# Patient Record
Sex: Female | Born: 1945 | Race: White | Hispanic: No | Marital: Married | Smoking: Never smoker
Health system: Southern US, Community
[De-identification: ages and names within clinical notes are randomized; demographics above are authoritative.]

## PROBLEM LIST (undated history)

## (undated) DIAGNOSIS — G629 Polyneuropathy, unspecified: Secondary | ICD-10-CM

## (undated) DIAGNOSIS — M1990 Unspecified osteoarthritis, unspecified site: Secondary | ICD-10-CM

## (undated) DIAGNOSIS — K219 Gastro-esophageal reflux disease without esophagitis: Secondary | ICD-10-CM

## (undated) DIAGNOSIS — I10 Essential (primary) hypertension: Secondary | ICD-10-CM

## (undated) DIAGNOSIS — E1140 Type 2 diabetes mellitus with diabetic neuropathy, unspecified: Secondary | ICD-10-CM

## (undated) DIAGNOSIS — E119 Type 2 diabetes mellitus without complications: Secondary | ICD-10-CM

## (undated) DIAGNOSIS — H269 Unspecified cataract: Secondary | ICD-10-CM

## (undated) DIAGNOSIS — S82899A Other fracture of unspecified lower leg, initial encounter for closed fracture: Secondary | ICD-10-CM

## (undated) DIAGNOSIS — E669 Obesity, unspecified: Secondary | ICD-10-CM

## (undated) DIAGNOSIS — G4730 Sleep apnea, unspecified: Secondary | ICD-10-CM

## (undated) DIAGNOSIS — E785 Hyperlipidemia, unspecified: Secondary | ICD-10-CM

## (undated) HISTORY — PX: COLON SURGERY: SHX602

## (undated) HISTORY — DX: Unspecified osteoarthritis, unspecified site: M19.90

## (undated) HISTORY — DX: Other fracture of unspecified lower leg, initial encounter for closed fracture: S82.899A

## (undated) HISTORY — DX: Essential (primary) hypertension: I10

## (undated) HISTORY — DX: Unspecified cataract: H26.9

## (undated) HISTORY — DX: Obesity, unspecified: E66.9

## (undated) HISTORY — DX: Hyperlipidemia, unspecified: E78.5

## (undated) HISTORY — DX: Type 2 diabetes mellitus without complications: E11.9

## (undated) HISTORY — DX: Gastro-esophageal reflux disease without esophagitis: K21.9

## (undated) HISTORY — DX: Sleep apnea, unspecified: G47.30

## (undated) HISTORY — DX: Type 2 diabetes mellitus with diabetic neuropathy, unspecified: E11.40

## (undated) HISTORY — PX: COLONOSCOPY: SHX174

## (undated) HISTORY — DX: Polyneuropathy, unspecified: G62.9

---
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
## 2006-05-01 ENCOUNTER — Emergency Department (HOSPITAL_COMMUNITY): Admission: EM | Admit: 2006-05-01 | Discharge: 2006-05-01 | Payer: Self-pay | Admitting: Emergency Medicine

## 2006-05-26 ENCOUNTER — Ambulatory Visit (HOSPITAL_COMMUNITY): Admission: RE | Admit: 2006-05-26 | Discharge: 2006-05-26 | Payer: Self-pay | Admitting: Chiropractic Medicine

## 2007-02-16 ENCOUNTER — Ambulatory Visit (HOSPITAL_COMMUNITY): Admission: RE | Admit: 2007-02-16 | Discharge: 2007-02-16 | Payer: Self-pay | Admitting: *Deleted

## 2013-02-24 LAB — LIPID PANEL
Cholesterol: 204 mg/dL — AB (ref 0–200)
LDL Cholesterol: 115 mg/dL
Triglycerides: 243 mg/dL — AB (ref 40–160)

## 2013-02-24 LAB — HEPATIC FUNCTION PANEL: ALT: 15 U/L (ref 7–35)

## 2013-02-24 LAB — BASIC METABOLIC PANEL
Creatinine: 0.8 mg/dL (ref 0.5–1.1)
Glucose: 119 mg/dL

## 2013-06-29 ENCOUNTER — Encounter: Payer: Self-pay | Admitting: Physician Assistant

## 2013-06-29 ENCOUNTER — Ambulatory Visit: Payer: Self-pay | Admitting: Physician Assistant

## 2013-06-29 VITALS — BP 164/108 | HR 71 | Temp 97.7°F | Resp 16 | Ht 63.0 in | Wt 240.6 lb

## 2013-06-29 DIAGNOSIS — E1169 Type 2 diabetes mellitus with other specified complication: Secondary | ICD-10-CM | POA: Insufficient documentation

## 2013-06-29 DIAGNOSIS — E1142 Type 2 diabetes mellitus with diabetic polyneuropathy: Secondary | ICD-10-CM

## 2013-06-29 DIAGNOSIS — E119 Type 2 diabetes mellitus without complications: Secondary | ICD-10-CM | POA: Insufficient documentation

## 2013-06-29 DIAGNOSIS — E785 Hyperlipidemia, unspecified: Secondary | ICD-10-CM | POA: Insufficient documentation

## 2013-06-29 DIAGNOSIS — E1149 Type 2 diabetes mellitus with other diabetic neurological complication: Secondary | ICD-10-CM

## 2013-06-29 DIAGNOSIS — I10 Essential (primary) hypertension: Secondary | ICD-10-CM

## 2013-06-29 DIAGNOSIS — Z6837 Body mass index (BMI) 37.0-37.9, adult: Secondary | ICD-10-CM | POA: Insufficient documentation

## 2013-06-29 LAB — HIV ANTIBODY (ROUTINE TESTING W REFLEX): HIV-1 Abs-EIA: NONREACTIVE

## 2013-06-29 MED ORDER — GABAPENTIN 300 MG PO CAPS: 300.0000 mg | ORAL_CAPSULE | ORAL | Status: DC | PRN

## 2013-06-29 MED ORDER — METFORMIN HCL 500 MG PO TABS: 500.0000 mg | ORAL_TABLET | Freq: Two times a day (BID) | ORAL | Status: DC

## 2013-06-29 NOTE — Progress Notes (Signed)
Subjective:    Patient ID: Galya Dunnigan, female    DOB: [birth year], 67 y.o.   MRN: [id number]  HPI This 67 y.o. female presents to establish care and for evaluation of several concerns:  1. Foot pain, bilaterally x months.  Burning, pins & needles.  Often intolerable.  No swelling.  No lesions/skin changes. Uses her husband's neurontin about once a week, "When I can't take it any more."  Doesn't take it during the day due to sedation.  2. Wants blood sugar checked. Has a glucometer and has been checking for the past month.  Glucose runs 150's-200's.  Reports "never over 300."  Life insurance PE a year ago revealed an A1C of 6.0, but a repeat exam last month showed an increase to 7.5%.    She reports that she was previously followed here by Dr. Perrin Maltese.  Chart review indicates that she was last here in 2007.  She reports that she doesn't have health insurance (qualifies for Medicare this fall) and doesn't like doctors, so she doesn't come in much.  A friend has been prescribing losartanHCT due to elevated BP.   Past Medical History  Diagnosis Date  . Hypertension   . Diabetes mellitus without complication   . Obesity   . Ankle fracture     Past Surgical History  Procedure Laterality Date  . Appendectomy      childhood    Prior to Admission medications   Medication Sig Start Date End Date Taking? Authorizing Provider  Ascorbic Acid (VITAMIN C) 1000 MG tablet Take 1,000 mg by mouth daily.   Yes Historical Provider, MD  aspirin 81 MG tablet Take 81 mg by mouth daily.   Yes Historical Provider, MD  B Complex-C (B-COMPLEX WITH VITAMIN C) tablet Take 1 tablet by mouth daily.   Yes Historical Provider, MD  Calcium-Vitamin D-Vitamin K 500-100-40 MG-UNT-MCG CHEW Chew by mouth.   Yes Historical Provider, MD  Cinnamon 500 MG capsule Take 500 mg by mouth daily.   Yes Historical Provider, MD  gabapentin (NEURONTIN) 300 MG capsule Take 1 capsule (300 mg total) by mouth as needed. 06/29/13  Yes  Toua Stites S Dametri Ozburn, PA-C  GLUCOSAMINE-CHONDROITIN-VIT D3 PO Take by mouth.   Yes Historical Provider, MD  losartan-hydrochlorothiazide (HYZAAR) 100-12.5 MG per tablet Take 1 tablet by mouth daily.   Yes Historical Provider, MD  magnesium oxide (MAG-OX) 400 MG tablet Take 400 mg by mouth daily.   Yes Historical Provider, MD  Multiple Vitamins-Minerals (CENTRUM SILVER ADULT 50+ PO) Take by mouth.   Yes Historical Provider, MD  Potassium Gluconate 595 MG CAPS Take by mouth.   Yes Historical Provider, MD  metFORMIN (GLUCOPHAGE) 500 MG tablet Take 1 tablet (500 mg total) by mouth 2 (two) times daily with a meal. 06/29/13   Ariyana Faw S Theodora Lalanne, PA-C    No Known Allergies  History   Social History  . Marital Status: Married    Spouse Name: Chelsye Suhre, Sr.    Number of Children: 2  . Years of Education: 12+   Occupational History  . realtor   . car dealer    Social History Main Topics  . Smoking status: Never Smoker   . Smokeless tobacco: Not on file  . Alcohol Use: No  . Drug Use: No  . Sexual Activity: Not Currently   Other Topics Concern  . Not on file   Social History Narrative   Lives with her husband.  Her adult children live locally.    Family  History  Problem Relation Age of Onset  . Cancer Mother 69    Colon  . Stroke Father 10  . Hypertension Sister   . Diabetes Sister   . Kidney disease Sister     due to HTN and DM  . Hypertension Brother   . Stroke Brother   . Hypertension Daughter   . Diabetes Daughter   . Hypertension Sister   . Diabetes Sister   . Hypertension Sister   . Hyperlipidemia Sister   . Hypertension Brother      Review of Systems As above. Denies chest pain, shortness of breath, HA, dizziness, vision change, nausea, vomiting, diarrhea, constipation, melena, hematochezia, dysuria, increased urinary urgency or frequency, increased hunger or thirst, unintentional weight change, unexplained myalgias or arthralgias, rash.     Objective:    Physical Exam Blood pressure 164/108, pulse 71, temperature 97.7 F (36.5 C), temperature source Oral, resp. rate 16, height 5\' 3"  (1.6 m), weight 240 lb 9.6 oz (109.135 kg), SpO2 97.00%. Body mass index is 42.63 kg/(m^2). Well-developed, well nourished WF who is awake, alert and oriented, in NAD. HEENT: [place]/AT, PERRL, EOMI.  Sclera and conjunctiva are clear.  Funduscopic examination is normal. EAC are patent, TMs are normal in appearance. Nasal mucosa is pink and moist. OP is clear. Neck: supple, non-tender, no lymphadenopathy, thyromegaly. Heart: RRR, no murmur Lungs: normal effort, CTA Extremities: no cyanosis, clubbing or edema. Skin: warm and dry without rash. Psychologic: good mood and appropriate affect, normal speech and behavior.  See DM foot exam      Assessment & Plan:  HTN (hypertension) - above goal today, but 130's/80's at PE last month.  Patient will monitor at home 3 x/week and bring the readings in for my review in 4 weeks.  DM (diabetes mellitus), type 2 - New diagnosis. Plan: metFORMIN (GLUCOPHAGE) 500 MG tablet. RTC 4 weeks with TIW glucose readings. Healthy eating, regular exercise. No labs done today as they were done last month.  Diabetic peripheral neuropathy associated with type 2 diabetes mellitus - Plan: gabapentin (NEURONTIN) 300 MG capsule; get control of glucose.  Expect to see improvement in neuropathy.  ADDENDUM:  Insurance PE results reviewed. Exam and labs were actually done 02/24/2013. Glucose 119 A1C 7.5% Creatinine 0.84 GFR >90 AST 19 ALT 15 TC 204 HDL 40 LDL 115 TG 243 HIV non-reactive  Fernande Bras, PA-C Physician Assistant-Certified [facility]

## 2013-06-29 NOTE — Patient Instructions (Addendum)
Check your blood pressure and blood sugar three times each week and record the results. Alternate the time that you check your sugar (sometimes do it when you haven't had anything to eat or drink for 8-12 hours and sometimes 2-3 hours after the largest meal of the day).  Make healthy eating choices, exercise regularly (150 minutes of cardiovascular exercise, like walking, each week) and get plenty of water to drink.  For the first week on the metformin, take 1 tablet each day.  Then increase to one tablet two times each day.  Bring your recent lab results to your next visit for me to review.

## 2013-07-27 ENCOUNTER — Encounter: Payer: Self-pay | Admitting: Physician Assistant

## 2013-07-27 ENCOUNTER — Ambulatory Visit: Payer: Self-pay | Admitting: Physician Assistant

## 2013-07-27 VITALS — BP 140/88 | HR 72 | Temp 98.3°F | Resp 16 | Ht 62.0 in | Wt 238.0 lb

## 2013-07-27 DIAGNOSIS — E669 Obesity, unspecified: Secondary | ICD-10-CM

## 2013-07-27 DIAGNOSIS — E119 Type 2 diabetes mellitus without complications: Secondary | ICD-10-CM

## 2013-07-27 DIAGNOSIS — I10 Essential (primary) hypertension: Secondary | ICD-10-CM

## 2013-07-27 NOTE — Progress Notes (Signed)
I have examined this patient along with the student and agree.  

## 2013-07-27 NOTE — Progress Notes (Signed)
  Subjective:    [name], female    DOB: 1945-11-07, 67 y.o.   MRN: [id number]  HPI   Karina Davis is a 67 year old female with DM and HTN here today for a f/u. She was started on Metformin and Gabapentin at her last visit. The metformin has been giving her some GI upset with loose stools, but it seems to be resolving. No nausea, vomiting, or constipation. Her blood sugars have started trending down with a low reading of 101 and highest of 181 at the beginning of the month. Lately been running in 130s/140s. She denies dizziness and lightheadedness.   The gabapentin make her really sleepy so she only takes it at night. Nofalls but feels a little woozey in the AMs. Helping with the foot pain and numbness but still has tingling sensation.   Her blood pressure has still been running all over the place: highest reading 162/97 and lowest 124/76. She denies headaches, chest pain, or palpitations. No swelling in her ankles or vision changes.She is still eating out most meals of the day.    Review of Systems As above    Objective:   Physical Exam  Constitutional: She is oriented to person, place, and time. She appears well-developed and well-nourished.  obese  HENT:  Head: Normocephalic and atraumatic.  Cardiovascular: Normal rate, regular rhythm, normal heart sounds and intact distal pulses.   Pulmonary/Chest: Effort normal and breath sounds normal.  Musculoskeletal: Edema: trace edema in b/l lower legs.  Neurological: She is alert and oriented to person, place, and time.  Diabetic foot exam performed, see Diabetic foot note   BP 140/88  Pulse 72  Temp(Src) 98.3 F (36.8 C) (Oral)  Resp 16  Ht 5\' 2"  (1.575 m)  Wt 238 lb (107.956 kg)  BMI 43.52 kg/m2  SpO2 96%     Assessment & Plan:  Type II or unspecified type diabetes mellitus without mention of complication, not stated as uncontrolled - Plan: HM Diabetes Foot Exam. Repeat labs in 2 months. Will update vaccines  at that time as well.  HTN (hypertension)- Plan: Continue to monitor BP at home. Will reassess med at next visit  Obesity- Plan: Work on diet and exercise

## 2013-09-11 ENCOUNTER — Telehealth: Payer: Self-pay

## 2013-09-11 DIAGNOSIS — E119 Type 2 diabetes mellitus without complications: Secondary | ICD-10-CM

## 2013-09-11 MED ORDER — LOSARTAN POTASSIUM-HCTZ 100-12.5 MG PO TABS: 1.0000 | ORAL_TABLET | Freq: Every day | ORAL | Status: DC

## 2013-09-11 NOTE — Telephone Encounter (Signed)
Gabapentin and Metformin should have refills. She is advised. She is asking for Hyzaar, she indicates we have not sent this in for her before, pended. Please advise.

## 2013-09-11 NOTE — Telephone Encounter (Signed)
Pt requesting her bp meds,nerve pain meds,metformin(these have not been written by our providers before)   Best phone for pt is [phone]   Pharmacy [facility]

## 2013-09-11 NOTE — Telephone Encounter (Signed)
Hyzaar sent

## 2013-10-05 ENCOUNTER — Ambulatory Visit (INDEPENDENT_AMBULATORY_CARE_PROVIDER_SITE_OTHER): Payer: Medicare Other | Admitting: Physician Assistant

## 2013-10-05 ENCOUNTER — Encounter: Payer: Self-pay | Admitting: Physician Assistant

## 2013-10-05 VITALS — BP 142/75 | HR 84 | Temp 98.8°F | Resp 16 | Ht 61.0 in | Wt 230.8 lb

## 2013-10-05 DIAGNOSIS — I10 Essential (primary) hypertension: Secondary | ICD-10-CM

## 2013-10-05 DIAGNOSIS — E119 Type 2 diabetes mellitus without complications: Secondary | ICD-10-CM

## 2013-10-05 DIAGNOSIS — E1149 Type 2 diabetes mellitus with other diabetic neurological complication: Secondary | ICD-10-CM

## 2013-10-05 DIAGNOSIS — E1142 Type 2 diabetes mellitus with diabetic polyneuropathy: Secondary | ICD-10-CM

## 2013-10-05 DIAGNOSIS — Z1159 Encounter for screening for other viral diseases: Secondary | ICD-10-CM

## 2013-10-05 DIAGNOSIS — Z1211 Encounter for screening for malignant neoplasm of colon: Secondary | ICD-10-CM

## 2013-10-05 DIAGNOSIS — E669 Obesity, unspecified: Secondary | ICD-10-CM

## 2013-10-05 DIAGNOSIS — Z23 Encounter for immunization: Secondary | ICD-10-CM

## 2013-10-05 DIAGNOSIS — Z1239 Encounter for other screening for malignant neoplasm of breast: Secondary | ICD-10-CM

## 2013-10-05 DIAGNOSIS — E785 Hyperlipidemia, unspecified: Secondary | ICD-10-CM

## 2013-10-05 LAB — COMPREHENSIVE METABOLIC PANEL
AST: 14 U/L (ref 0–37)
BUN: 15 mg/dL (ref 6–23)
Chloride: 101 mEq/L (ref 96–112)
Creat: 0.82 mg/dL (ref 0.50–1.10)
Total Bilirubin: 0.4 mg/dL (ref 0.3–1.2)

## 2013-10-05 LAB — LIPID PANEL
Cholesterol: 176 mg/dL (ref 0–200)
HDL: 37 mg/dL — ABNORMAL LOW (ref >39)
Triglycerides: 248 mg/dL — ABNORMAL HIGH (ref <150)

## 2013-10-05 LAB — CBC WITH DIFFERENTIAL/PLATELET
Basophils Absolute: 0 10*3/uL (ref 0.0–0.1)
Basophils Relative: 0 % (ref 0–1)
Hemoglobin: 13.1 g/dL (ref 12.0–15.0)
MCHC: 34.3 g/dL (ref 30.0–36.0)
MCV: 84 fL (ref 78.0–100.0)
Monocytes Absolute: 0.8 10*3/uL (ref 0.1–1.0)
Neutro Abs: 8.2 10*3/uL — ABNORMAL HIGH (ref 1.7–7.7)
Neutrophils Relative %: 68 % (ref 43–77)
RBC: 4.55 MIL/uL (ref 3.87–5.11)
WBC: 12.1 10*3/uL — ABNORMAL HIGH (ref 4.0–10.5)

## 2013-10-05 LAB — TSH: TSH: 4.336 u[IU]/mL (ref 0.350–4.500)

## 2013-10-05 LAB — POCT GLYCOSYLATED HEMOGLOBIN (HGB A1C): Hemoglobin A1C: 6.1

## 2013-10-05 MED ORDER — ZOSTER VACCINE LIVE 19400 UNT/0.65ML ~~LOC~~ SOLR: 0.6500 mL | Freq: Once | SUBCUTANEOUS | Status: DC

## 2013-10-05 MED ORDER — GABAPENTIN 300 MG PO CAPS: 600.0000 mg | ORAL_CAPSULE | Freq: Every day | ORAL | Status: DC

## 2013-10-05 NOTE — Progress Notes (Signed)
Subjective:    [name], female    DOB: [birth year], 67 y.o.   MRN: [id number]  PCP: No primary provider on file.  Chief Complaint  Patient presents with  . Hypertension  . Diabetes    HPI  Presents for re-evaluation of HTN and DM type 2 with peripheral neuropathy. She's been working hard at being more healthy and is pleased with her weight loss. She continues to tolerate the medications without difficulty. Frequency of home glucose monitoring: 1-2 x/week, random readings are 120-155 Home BP readings were normal until 12/01 and 12/04 when they were 150's/85-90 Does not see a dentist Q6 months, eye specialist annually. Checks feet daily. Is not current with influenza vaccine. Is not current with pneumococcal vaccine.  Nerve pain in feet at night is improved, but not resolved.  Sometimes feel numb, "Like rubber feet." No daytime symptoms. Neurontin makes her too sleepy to take during the day.  Review of Systems Denies chest pain, shortness of breath, HA, dizziness, vision change, nausea, vomiting, diarrhea, constipation, melena, hematochezia, dysuria, increased urinary urgency or frequency, increased hunger or thirst, unintentional weight change, unexplained myalgias or arthralgias, rash.     Objective:   Physical Exam  Blood pressure 142/75, pulse 84, temperature 98.8 F (37.1 C), temperature source Oral, resp. rate 16, height 5\' 1"  (1.549 m), weight 230 lb 12.8 oz (104.69 kg), SpO2 97.00%. Body mass index is 43.63 kg/(m^2). Well-developed, well nourished WF who is awake, alert and oriented, in NAD. Weight is down 10 pounds in the past 3 months. HEENT: [place]/AT, PERRL, EOMI.  Sclera and conjunctiva are clear.  EAC are patent, TMs are normal in appearance. Nasal mucosa is pink and moist. OP is clear. Neck: supple, non-tender, no lymphadenopathy, thyromegaly. Heart: RRR, no murmur Lungs: normal effort, CTA Abdomen: normo-active bowel sounds, supple, non-tender, no  mass or organomegaly. Extremities: no cyanosis, clubbing or edema. Skin: warm and dry without rash. Psychologic: good mood and appropriate affect, normal speech and behavior. See DM foot exam.  Results for orders placed in visit on 10/05/13  GLUCOSE, POCT (MANUAL RESULT ENTRY)      Result Value Range   POC Glucose 88  70 - 99 mg/dl  POCT GLYCOSYLATED HEMOGLOBIN (HGB A1C)      Result Value Range   Hemoglobin A1C 6.1         Assessment & Plan:  1. DM (diabetes mellitus), type 2 Much improved, controlled - POCT glucose (manual entry) - POCT glycosylated hemoglobin (Hb A1C) - Microalbumin, urine - HM Diabetes Eye Exam - HM Diabetes Foot Exam  2. Diabetic peripheral neuropathy associated with type 2 diabetes mellitus Increase dose.  If causes morning somolence, we can reduce dose. - gabapentin (NEURONTIN) 300 MG capsule; Take 2 capsules (600 mg total) by mouth at bedtime.  Dispense: 180 capsule; Refill: 5  3. HTN (hypertension) Above goal today, but elect to continue current regimen for now. Increase regimen at next visit it remains >140/80 - CBC with Differential - Comprehensive metabolic panel - TSH  4. Hyperlipidemia LDL goal < 70 Await labs - Lipid panel  5. Obesity Congratulated on efforts and success thus far.  Encouraged continued work.  6. Need for hepatitis C screening test - Hepatitis C antibody  7. Need for influenza vaccination - Flu Vaccine QUAD 36+ mos IM  8. Need for pneumococcal vaccination - Pneumococcal polysaccharide vaccine 23-valent greater than or equal to 2yo subcutaneous/IM  9. Need for Tdap vaccination - Tdap vaccine greater  than or equal to 7yo IM  10. Need for shingles vaccine - zoster vaccine live, PF, (ZOSTAVAX) 47829 UNT/0.65ML injection; Inject 19,400 Units into the skin once.  Dispense: 0.65 mL; Refill: 0  11. Screening for colon cancer - Ambulatory referral to Gastroenterology  12. Screening for breast cancer - MM Digital  Screening; Future  Return in about 3 months (around 01/03/2014) for re-evaluation and Wellness Exam.  Fernande Bras, PA-C Physician Assistant-Certified [facility]

## 2013-10-05 NOTE — Patient Instructions (Addendum)
I will contact you with your lab results as soon as they are available.   If you have not heard from me in 2 weeks, please contact me.  The fastest way to get your results is to register for My Chart (see the instructions on the last page of this printout).  INCREASE the gabapentin (neurontin) dose to 600 mg each evening.  If you feel tired in the mornings, we can reduce the dose. Please schedule an eye exam and a dental exam. You should hear from the mammography center and the GI office to schedule the mammogram and the colonoscopy.  If you haven't heard from them in the next week, please contact this office.

## 2013-10-06 LAB — MICROALBUMIN, URINE: Microalb, Ur: 2.21 mg/dL — ABNORMAL HIGH (ref 0.00–1.89)

## 2013-10-06 LAB — HEPATITIS C ANTIBODY: HCV Ab: NEGATIVE

## 2013-11-09 ENCOUNTER — Ambulatory Visit: Payer: Self-pay

## 2013-11-15 ENCOUNTER — Encounter: Payer: Self-pay | Admitting: Internal Medicine

## 2013-12-27 ENCOUNTER — Encounter: Payer: Self-pay | Admitting: Gastroenterology

## 2014-01-04 ENCOUNTER — Ambulatory Visit (INDEPENDENT_AMBULATORY_CARE_PROVIDER_SITE_OTHER): Payer: Medicare Other | Admitting: Physician Assistant

## 2014-01-04 ENCOUNTER — Encounter: Payer: Self-pay | Admitting: Physician Assistant

## 2014-01-04 VITALS — BP 158/82 | HR 69 | Temp 98.6°F | Resp 18 | Ht 61.5 in | Wt 228.0 lb

## 2014-01-04 DIAGNOSIS — E119 Type 2 diabetes mellitus without complications: Secondary | ICD-10-CM

## 2014-01-04 DIAGNOSIS — Z0000 Encounter for general adult medical examination without abnormal findings: Secondary | ICD-10-CM

## 2014-01-04 DIAGNOSIS — E785 Hyperlipidemia, unspecified: Secondary | ICD-10-CM

## 2014-01-04 DIAGNOSIS — Z139 Encounter for screening, unspecified: Secondary | ICD-10-CM

## 2014-01-04 DIAGNOSIS — Z124 Encounter for screening for malignant neoplasm of cervix: Secondary | ICD-10-CM

## 2014-01-04 DIAGNOSIS — I10 Essential (primary) hypertension: Secondary | ICD-10-CM

## 2014-01-04 DIAGNOSIS — E669 Obesity, unspecified: Secondary | ICD-10-CM

## 2014-01-04 DIAGNOSIS — Z23 Encounter for immunization: Secondary | ICD-10-CM

## 2014-01-04 DIAGNOSIS — K029 Dental caries, unspecified: Secondary | ICD-10-CM | POA: Insufficient documentation

## 2014-01-04 LAB — CBC WITH DIFFERENTIAL/PLATELET
Basophils Absolute: 0 10*3/uL (ref 0.0–0.1)
Basophils Relative: 0 % (ref 0–1)
EOS PCT: 2 % (ref 0–5)
Eosinophils Absolute: 0.2 10*3/uL (ref 0.0–0.7)
HEMATOCRIT: 37.2 % (ref 36.0–46.0)
Hemoglobin: 12.6 g/dL (ref 12.0–15.0)
LYMPHS ABS: 2.9 10*3/uL (ref 0.7–4.0)
Lymphocytes Relative: 27 % (ref 12–46)
MCH: 28.1 pg (ref 26.0–34.0)
MCHC: 33.9 g/dL (ref 30.0–36.0)
MCV: 83 fL (ref 78.0–100.0)
Monocytes Absolute: 0.5 10*3/uL (ref 0.1–1.0)
Monocytes Relative: 5 % (ref 3–12)
Neutro Abs: 7.1 10*3/uL (ref 1.7–7.7)
Neutrophils Relative %: 66 % (ref 43–77)
PLATELETS: 339 10*3/uL (ref 150–400)
RBC: 4.48 MIL/uL (ref 3.87–5.11)
RDW: 14.5 % (ref 11.5–15.5)
WBC: 10.8 10*3/uL — AB (ref 4.0–10.5)

## 2014-01-04 LAB — COMPLETE METABOLIC PANEL WITH GFR
ALT: 15 U/L (ref 0–35)
AST: 18 U/L (ref 0–37)
Albumin: 4.1 g/dL (ref 3.5–5.2)
Alkaline Phosphatase: 101 U/L (ref 39–117)
BILIRUBIN TOTAL: 0.4 mg/dL (ref 0.2–1.2)
BUN: 13 mg/dL (ref 6–23)
CHLORIDE: 101 meq/L (ref 96–112)
CO2: 26 mEq/L (ref 19–32)
CREATININE: 0.73 mg/dL (ref 0.50–1.10)
Calcium: 9.6 mg/dL (ref 8.4–10.5)
GFR, Est African American: >89 mL/min
GFR, Est Non African American: 87 mL/min
GLUCOSE: 100 mg/dL — AB (ref 70–99)
Potassium: 4.3 mEq/L (ref 3.5–5.3)
Sodium: 138 mEq/L (ref 135–145)
Total Protein: 6.9 g/dL (ref 6.0–8.3)

## 2014-01-04 LAB — POCT WET PREP WITH KOH
KOH PREP POC: NEGATIVE
RBC Wet Prep HPF POC: NEGATIVE
TRICHOMONAS UA: NEGATIVE
Yeast Wet Prep HPF POC: NEGATIVE

## 2014-01-04 LAB — LIPID PANEL
Cholesterol: 189 mg/dL (ref 0–200)
HDL: 37 mg/dL — AB (ref >39)
LDL CALC: 112 mg/dL — AB (ref 0–99)
TRIGLYCERIDES: 200 mg/dL — AB (ref <150)
Total CHOL/HDL Ratio: 5.1 Ratio
VLDL: 40 mg/dL (ref 0–40)

## 2014-01-04 LAB — POCT GLYCOSYLATED HEMOGLOBIN (HGB A1C): HEMOGLOBIN A1C: 6.4

## 2014-01-04 LAB — IFOBT (OCCULT BLOOD): IMMUNOLOGICAL FECAL OCCULT BLOOD TEST: NEGATIVE

## 2014-01-04 MED ORDER — ZOSTER VACCINE LIVE 19400 UNT/0.65ML ~~LOC~~ SOLR: 0.6500 mL | Freq: Once | SUBCUTANEOUS | Status: DC

## 2014-01-04 NOTE — Progress Notes (Signed)
Subjective:    [name], female    DOB: 01-01-1946, 68 y.o.   MRN: [id number]  HPI  No menses x 5 years. Last pap 2004.   Review of Systems  Constitutional: Positive for diaphoresis.  Respiratory: Positive for wheezing.   Cardiovascular: Positive for palpitations.  Gastrointestinal: Positive for diarrhea.  Musculoskeletal: Positive for myalgias.  Neurological: Positive for numbness.       Objective:   Physical Exam  Vitals reviewed. Constitutional: She is oriented to person, place, and time. She appears well-developed and well-nourished. She is active and cooperative. No distress.  BP 158/82  Pulse 69  Temp(Src) 98.6 F (37 C)  Resp 18  Ht 5' 1.5" (1.562 m)  Wt 228 lb (103.42 kg)  BMI 42.39 kg/m2  SpO2 94%   HENT:  Head: Normocephalic and atraumatic.  Right Ear: Hearing, tympanic membrane, external ear and ear canal normal. No foreign bodies.  Left Ear: Hearing, tympanic membrane, external ear and ear canal normal. No foreign bodies.  Nose: Nose normal.  Mouth/Throat: Uvula is midline, oropharynx is clear and moist and mucous membranes are normal. No oral lesions. Abnormal dentition (several broken/rotten teeth). Dental caries present. No dental abscesses or uvula swelling. No oropharyngeal exudate.  Eyes: Conjunctivae, EOM and lids are normal. Pupils are equal, round, and reactive to light. Right eye exhibits no discharge. Left eye exhibits no discharge. No scleral icterus.  Fundoscopic exam:      The right eye shows no arteriolar narrowing, no AV nicking, no exudate, no hemorrhage and no papilledema.       The left eye shows no arteriolar narrowing, no AV nicking, no exudate, no hemorrhage and no papilledema.  Neck: Trachea normal, normal range of motion and full passive range of motion without pain. Neck supple. No spinous process tenderness and no muscular tenderness present. No mass and no thyromegaly present.  Cardiovascular: Normal rate, regular  rhythm, normal heart sounds, intact distal pulses and normal pulses.   Pulmonary/Chest: Effort normal and breath sounds normal. She exhibits no tenderness and no retraction. Right breast exhibits no inverted nipple, no mass, no nipple discharge, no skin change and no tenderness. Left breast exhibits no inverted nipple, no mass, no nipple discharge, no skin change and no tenderness. Breasts are symmetrical.  Abdominal: Soft. Normal appearance and bowel sounds are normal. She exhibits no distension and no mass. There is no hepatosplenomegaly. There is no tenderness. There is no rigidity, no rebound, no guarding, no CVA tenderness, no tenderness at McBurney's point and negative Murphy's sign. No hernia. Hernia confirmed negative in the right inguinal area and confirmed negative in the left inguinal area.  Genitourinary: Rectum normal, vagina normal and uterus normal. Rectal exam shows no external hemorrhoid and no fissure. No breast swelling, tenderness, discharge or bleeding. Pelvic exam was performed with patient supine. No labial fusion. There is no rash, tenderness, lesion or injury on the right labia. There is no rash, tenderness, lesion or injury on the left labia. Cervix exhibits no motion tenderness, no discharge and no friability. Right adnexum displays no mass, no tenderness and no fullness. Left adnexum displays no mass, no tenderness and no fullness. No erythema, tenderness or bleeding around the vagina. No foreign body around the vagina. No signs of injury around the vagina. No vaginal discharge found.  Musculoskeletal: She exhibits no edema and no tenderness.       Cervical back: Normal.       Thoracic back: Normal.  Lumbar back: Normal.  Lymphadenopathy:       Head (right side): No tonsillar, no preauricular, no posterior auricular and no occipital adenopathy present.       Head (left side): No tonsillar, no preauricular, no posterior auricular and no occipital adenopathy present.    She  has no cervical adenopathy.    She has no axillary adenopathy.       Right: No inguinal and no supraclavicular adenopathy present.       Left: No inguinal and no supraclavicular adenopathy present.  Neurological: She is alert and oriented to person, place, and time. She has normal strength and normal reflexes. No cranial nerve deficit. She exhibits normal muscle tone. Coordination and gait normal.  Skin: Skin is warm, dry and intact. No rash noted. She is not diaphoretic. No cyanosis or erythema. Nails show no clubbing.  Psychiatric: She has a normal mood and affect. Her speech is normal and behavior is normal. Judgment and thought content normal.      Results for orders placed in visit on 01/04/14  POCT GLYCOSYLATED HEMOGLOBIN (HGB A1C)      Result Value Ref Range   Hemoglobin A1C 6.4    POCT WET PREP WITH KOH      Result Value Ref Range   Trichomonas, UA Negative     Clue Cells Wet Prep HPF POC 3-5     Epithelial Wet Prep HPF POC 0-1     Yeast Wet Prep HPF POC neg     Bacteria Wet Prep HPF POC 2+     RBC Wet Prep HPF POC neg     WBC Wet Prep HPF POC 0-1     KOH Prep POC Negative    IFOBT (OCCULT BLOOD)      Result Value Ref Range   IFOBT Negative         Assessment & Plan:  1. Medicare annual wellness visit, initial 2. Annual physical exam Age appropriate anticipatory guidance provided. - IFOBT POC (occult bld, rslt in office); needs colonoscopy. Mammogram scheduled.  3. DM (diabetes mellitus), type 2 Good control. - POCT glycosylated hemoglobin (Hb A1C) - HM Diabetes Foot Exam, Eye Exam  4. Hyperlipidemia LDL goal < 70 Await labs. - Lipid panel  5. HTN (hypertension) Above goal today. - CBC with Differential - COMPLETE METABOLIC PANEL WITH GFR - EKG 12-Lead  6. Obesity Healthy eating, regular exercise.  7. Need for shingles vaccine - zoster vaccine live, PF, (ZOSTAVAX) 27062 UNT/0.65ML injection; Inject 19,400 Units into the skin once.  Dispense: 0.65 mL;  Refill: 0  8. Need for hepatitis B vaccination Dose #1 given today.  Return in 1 month for #2, and in 6 months for #3. - Hepatitis B vaccine adult IM; Standing - Hepatitis B vaccine adult IM  9. Screening for cervical cancer If both pap and HPV are negative, repeat both in 5 years. - Pap IG and HPV (high risk) DNA detection - POCT Wet Prep with KOH  10. Carious teeth Follow-up with DDS as planned.   Fara Chute, PA-C Physician Assistant-Certified [facility]

## 2014-01-04 NOTE — Patient Instructions (Addendum)
I will contact you with your lab results as soon as they are available.   If you have not heard from me in 2 weeks, please contact me.  The fastest way to get your results is to register for My Chart (see the instructions on the last page of this printout).  Call to reschedule the mammogram ([facility] [phone])  Keeping You Healthy  Get These Tests  Blood Pressure- Have your blood pressure checked by your healthcare provider at least once a year.  Normal blood pressure is 120/80.  Weight- Have your body mass index (BMI) calculated to screen for obesity.  BMI is a measure of body fat based on height and weight.  You can calculate your own BMI at https://www.west-esparza.com/  Cholesterol- Have your cholesterol checked every year.  Diabetes- Have your blood sugar checked every year if you have high blood pressure, high cholesterol, a family history of diabetes or if you are overweight.  Pap Smear- Have a pap smear every 1 to 3 years if you have been sexually active.  If you are older than 65 and recent pap smears have been normal you may not need additional pap smears.  In addition, if you have had a hysterectomy  For benign disease additional pap smears are not necessary.  Mammogram-Yearly mammograms are essential for early detection of breast cancer  Screening for Colon Cancer- Colonoscopy starting at age 92. Screening may begin sooner depending on your family history and other health conditions.  Follow up colonoscopy as directed by your Gastroenterologist.  Screening for Osteoporosis- Screening begins at age 57 with bone density scanning, sooner if you are at higher risk for developing Osteoporosis.  Get these medicines  Calcium with Vitamin D- Your body requires 1200-1500 mg of Calcium a day and [phone] IU of Vitamin D a day.  You can only absorb 500 mg of Calcium at a time therefore Calcium must be taken in 2 or 3 separate doses throughout the day.  Hormones- Hormone  therapy has been associated with increased risk for certain cancers and heart disease.  Talk to your healthcare provider about if you need relief from menopausal symptoms.  Aspirin- Ask your healthcare provider about taking Aspirin to prevent Heart Disease and Stroke.  Get these Immuniztions  Flu shot- Every fall  Pneumonia shot- Once after the age of 24; if you are younger ask your healthcare provider if you need a pneumonia shot.  Tetanus- Every ten years.  Zostavax- Once after the age of 4 to prevent shingles.  Take these steps  Don't smoke- Your healthcare provider can help you quit. For tips on how to quit, ask your healthcare provider or go to www.smokefree.gov or call 1-800 QUIT-NOW.  Be physically active- Exercise 5 days a week for a minimum of 30 minutes.  If you are not already physically active, start slow and gradually work up to 30 minutes of moderate physical activity.  Try walking, dancing, bike riding, swimming, etc.  Eat a healthy diet- Eat a variety of healthy foods such as fruits, vegetables, whole grains, low fat milk, low fat cheeses, yogurt, lean meats, chicken, fish, eggs, dried beans, tofu, etc.  For more information go to www.thenutritionsource.org  Dental visit- Brush and floss teeth twice daily; visit your dentist twice a year.  Eye exam- Visit your Optometrist or Ophthalmologist yearly.  Drink alcohol in moderation- Limit alcohol intake to one drink or less a day.  Never drink and drive.  Depression- Your emotional health is  as important as your physical health.  If you're feeling down or losing interest in things you normally enjoy, please talk to your healthcare provider.  Seat Belts- can save your life; always wear one  Smoke/Carbon Monoxide detectors- These detectors need to be installed on the appropriate level of your home.  Replace batteries at least once a year.  Violence- If anyone is threatening or hurting you, please tell your healthcare  provider. Living Will/ Health care power of attorney- Discuss with your healthcare provider and family.Cancer Prevention Tips Eat more foods that come from plants, such as fruits, vegetables, beans, nuts, and whole grains. Eat less food that comes from animals, such as meat, cheese, and eggs. Eat 5 servings of different fruits and vegetables every day. Eat fruits and vegetables of all colors. Eat green foods like broccoli, lettuce, or greens. Eat yellow-orange foods like carrots, cantaloupe, bananas, or sweet potatoes. Eat red foods like strawberries, tomatoes, or red beans. Eat blue or purple foods like blueberries, eggplant, or plums. Eat white foods like garlic, potatoes, or onions. Avoid fried food. Eat brown rice, whole wheat bread, whole grain pasta, and cereals. Eat less white rice, white bread, regular pasta, desserts, sweetened cereals, soft drinks, and sugars. Eat fish, chicken, [place], or beans. Eat only a small piece of any meat. Cook meat by baking, broiling, or boiling. Stay at a healthy weight. Lose weight if you are too heavy. Eat less food that is high in empty calories, like [place] fries, fried chicken, pizza, doughnuts, and other sweets. Move or exercise as much as you can. Try walking, gardening, dancing, or bicycling to help you burn calories. Take the stairs instead of the elevator. Walk to where you are going instead of driving. Drink less alcohol. Men should limit alcohol to 2 drinks per day. Women should limit alcohol to 1 drink per day. Do not smoke or use any kind of tobacco. Check with your doctor before you make any big changes in your diet or lifestyle. Document Released: 01/08/2009 Document Revised: 04/12/2012 Document Reviewed: 01/08/2009 [facility] Patient Information 2014 [place], [place]. Heart Disease Prevention Heart disease can lead to heart attacks and strokes. This is a leading cause of death. Heart disease can be inherited and can be caused from the  lifestyle you lead. You can do a lot to keep your heart and blood vessels healthy.  WHAT SHOULD I DO EACH DAY TO KEEP MY HEART HEALTHY? Do not smoke. Follow a healthy eating plan as recommended by your caregiver or dietitian. Be active for a total of 30 minutes most days. Ask your caregiver what activities are best for you. Limit the amount of alcohol you drink. Involve family and friends to help you with a healthy lifestyle. HOW DOES HEART DISEASE CAUSE HIGH BLOOD PRESSURE? Narrowed blood vessels leave a smaller opening for blood to flow through. It is like turning on a garden hose and holding your thumb over the opening. The smaller opening makes the water shoot out with more pressure. In the same way, narrowed blood vessels can lead to high blood pressure. Other factors, such as kidney problems and being overweight, also can lead to high blood pressure. If you have high blood pressure you may need to take blood pressure medicine every day. Some types of blood pressure medicine can also help keep your kidneys healthy. Many people with diabetes also have high blood pressure. If you have heart, eye, or kidney problems from diabetes, high blood pressure can make them worse. HOW  DO MY BLOOD VESSELS GET CLOGGED? Cholesterol is a substance that is made by the body and used for many important functions. It is also found in food that comes from animals. When your cholesterol is high, it can stick to the insides of your blood vessels, making them narrowed and even clogged. This problem is called atherosclerosis. Narrowed and clogged blood vessels make it harder for blood to get to important body organs. This can cause problems such as: Chest pain (angina). Angina can cause temporary pain in your chest, arms, shoulders, or back. You may feel the pain more when your heart beats faster, such as when you exercise. The pain may go away when you rest. You also may feel very weak and sweaty. A heart attack. A heart  attack happens when a blood vessel in or near the heart becomes blocked. Not enough blood is getting to the heart. During a heart attack, you may have chest pain in your chest, arms, shoulders, or back along with nausea, indigestion, extreme weakness, and sweating. WHAT CAN I DO TO PREVENT HEART DISEASE?  Keep your blood pressure under control as recommended by your caregiver. Keep your cholesterol under control. Have it checked at least once a year. Target cholesterol levels for most people are: Total blood cholesterol level: Below 200. LDL (bad) cholesterol: Below 100. HDL (good) cholesterol: Above 40 in men and above 50 in women. Triglycerides (another type of fat in the blood): Below 150. Make physical activity a part of your daily routine. Check with your caregiver to learn what activities are best for you. Make sure that the foods you eat are "heart-healthy." Include foods high in fiber, such as oat bran, oatmeal, whole-grain breads and cereals. Cut back on fried foods and foods high in saturated fat. This includes foods such as meats, butter, whole dairy products, shortening, and coconut or palm oil. Avoid salty foods such as canned food, luncheon meat, salty snacks, and fast food. Eat more fruits and vegetables. Drink less alcohol. Lose weight as recommended by your caregiver. If you smoke, quit. Your caregiver can help you with quitting options. Ask your caregiver whether you should take a daily aspirin. Studies have shown that taking aspirin can help reduce your risk of heart disease and stroke. Take your prescribed medicines as directed. WHAT ARE THE WARNING SIGNS OF A HEART ATTACK? You may have one or more of the following warning signs: Chest pain or discomfort. Pain or discomfort in your arms, back, jaw, or neck. Indigestion or stomach pain. Shortness of breath. Sweating. Nausea or vomiting. Lightheadedness. No warning signs at all or they may come and go. FOR MORE  INFORMATION  To find out more about heart disease and stroke prevention, visit the American Heart Association website at www.americanheart.org Document Released: 05/26/2004 Document Revised: 04/12/2012 Document Reviewed: 12/09/2007 [facility] Patient Information 2014 [place], [place]. Calorie Counting Diet A calorie counting diet requires you to eat the number of calories that are right for you in a day. Calories are the measurement of how much energy you get from the food you eat. Eating the right amount of calories is important for staying at a healthy weight. If you eat too many calories, your body will store them as fat and you may gain weight. If you eat too few calories, you may lose weight. Counting the number of calories you eat during a day will help you know if you are eating the right amount. A Registered Dietitian can determine how many calories you  need in a day. The amount of calories needed varies from person to person. If your goal is to lose weight, you will need to eat fewer calories. Losing weight can benefit you if you are overweight or have health problems such as heart disease, high blood pressure, or diabetes. If your goal is to gain weight, you will need to eat more calories. Gaining weight may be necessary if you have a certain health problem that causes your body to need more energy. TIPS Whether you are increasing or decreasing the number of calories you eat during a day, it may be hard to get used to changes in what you eat and drink. The following are tips to help you keep track of the number of calories you eat. Measure foods at home with measuring cups. This helps you know the amount of food and number of calories you are eating. Restaurants often serve food in amounts that are larger than 1 serving. While eating out, estimate how many servings of a food you are given. For example, a serving of cooked rice is  cup or about the size of half of a fist. Knowing serving sizes will  help you be aware of how much food you are eating at restaurants. Ask for smaller portion sizes or child-size portions at restaurants. Plan to eat half of a meal at a restaurant. Take the rest home or share the other half with a friend. Read the Nutrition Facts panel on food labels for calorie content and serving size. You can find out how many servings are in a package, the size of a serving, and the number of calories each serving has. For example, a package might contain 3 cookies. The Nutrition Facts panel on that package says that 1 serving is 1 cookie. Below that, it will say there are 3 servings in the container. The calories section of the Nutrition Facts label says there are 90 calories. This means there are 90 calories in 1 cookie (1 serving). If you eat 1 cookie you have eaten 90 calories. If you eat all 3 cookies, you have eaten 270 calories (3 servings x 90 calories = 270 calories). The list below tells you how big or small some common portion sizes are. 1 oz.........4 stacked dice. 3 oz........Marland KitchenDeck of cards. 1 tsp.......Marland KitchenTip of little finger. 1 tbs......Marland KitchenMarland KitchenThumb. 2 tbs.......Marland KitchenGolf ball.  cup......Marland KitchenHalf of a fist. 1 cup.......Marland KitchenA fist. KEEP A FOOD LOG Write down every food item you eat, the amount you eat, and the number of calories in each food you eat during the day. At the end of the day, you can add up the total number of calories you have eaten. It may help to keep a list like the one below. Find out the calorie information by reading the Nutrition Facts panel on food labels. Breakfast Bran cereal (1 cup, 110 calories). Fat-free milk ( cup, 45 calories). Snack Apple (1 medium, 80 calories). Lunch Spinach (1 cup, 20 calories). Tomato ( medium, 20 calories). Chicken breast strips (3 oz, 165 calories). Shredded cheddar cheese ( cup, 110 calories). Light [place] dressing (2 tbs, 60 calories). Whole-wheat bread (1 slice, 80 calories). Tub margarine (1 tsp, 35  calories). Vegetable soup (1 cup, 160 calories). Dinner Pork chop (3 oz, 190 calories). Brown rice (1 cup, 215 calories). Steamed broccoli ( cup, 20 calories). Strawberries (1  cup, 65 calories). Whipped cream (1 tbs, 50 calories). Daily Calorie Total: 5643 Document Released: 10/12/2005 Document Revised: 01/04/2012 Document Reviewed: 04/08/2007 [employer] Patient Information  2014 [place], [place].

## 2014-01-08 LAB — PAP IG AND HPV HIGH-RISK: HPV DNA HIGH RISK: NOT DETECTED

## 2014-01-10 ENCOUNTER — Telehealth: Payer: Self-pay

## 2014-01-10 ENCOUNTER — Encounter: Payer: Self-pay | Admitting: Internal Medicine

## 2014-01-10 NOTE — Telephone Encounter (Signed)
Your pap test and HPV test were both negative, so we'll repeat them both in 5 years. Your labs are otherwise good, except for the cholesterol. I expect that your continued efforts for healthy eating and weight loss will improve those numbers, though we may still need to add something. We'll see how they improve over the next 3 months  Reviewed labs with patient, copy mailed to patient.

## 2014-02-02 ENCOUNTER — Ambulatory Visit (AMBULATORY_SURGERY_CENTER): Payer: Self-pay | Admitting: *Deleted

## 2014-02-02 VITALS — Ht 62.0 in | Wt 229.8 lb

## 2014-02-02 DIAGNOSIS — Z1211 Encounter for screening for malignant neoplasm of colon: Secondary | ICD-10-CM

## 2014-02-02 MED ORDER — NA SULFATE-K SULFATE-MG SULF 17.5-3.13-1.6 GM/177ML PO SOLN: 1.0000 | Freq: Once | ORAL | Status: DC

## 2014-02-02 NOTE — Progress Notes (Signed)
No allergies to eggs or soy. No problems with anesthesia.  Pt given Emmi instructions for colonoscopy  

## 2014-02-14 ENCOUNTER — Ambulatory Visit (AMBULATORY_SURGERY_CENTER): Payer: Medicare Other | Admitting: Gastroenterology

## 2014-02-14 ENCOUNTER — Encounter: Payer: Self-pay | Admitting: Gastroenterology

## 2014-02-14 VITALS — BP 205/76 | HR 70 | Temp 97.2°F | Resp 14 | Ht 62.0 in | Wt 229.0 lb

## 2014-02-14 DIAGNOSIS — K5730 Diverticulosis of large intestine without perforation or abscess without bleeding: Secondary | ICD-10-CM

## 2014-02-14 DIAGNOSIS — D126 Benign neoplasm of colon, unspecified: Secondary | ICD-10-CM

## 2014-02-14 DIAGNOSIS — Z1211 Encounter for screening for malignant neoplasm of colon: Secondary | ICD-10-CM

## 2014-02-14 DIAGNOSIS — K648 Other hemorrhoids: Secondary | ICD-10-CM

## 2014-02-14 MED ORDER — SODIUM CHLORIDE 0.9 % IV SOLN: 500.0000 mL | INTRAVENOUS | Status: DC

## 2014-02-14 NOTE — Progress Notes (Signed)
Report to pacu rn, vss, bbs=clear 

## 2014-02-14 NOTE — Op Note (Addendum)
[place]  [address] [place], [address]   COLONOSCOPY PROCEDURE REPORT  PATIENT: Karina Davis, Karina Davis  MR#: [id number] BIRTHDATE: 1945-12-27 , 50  yrs. old GENDER: Female ENDOSCOPIST: Inda Castle, MD REFERRED MO:QHUTML [place], [place] PROCEDURE DATE:  02/14/2014 PROCEDURE:   Colonoscopy with snare polypectomy First Screening Colonoscopy - Avg.  risk and is 50 yrs.  old or older Yes.  Prior Negative Screening - Now for repeat screening. N/A  History of Adenoma - Now for follow-up colonoscopy & has been > or = to 3 yrs.  N/A  Polyps Removed Today? Yes. ASA CLASS:   Class II INDICATIONS: Patient's immediate family history of colon cancer. MEDICATIONS: MAC sedation, administered by CRNA and propofol (Diprivan) 250mg  IV  DESCRIPTION OF PROCEDURE:   After the risks benefits and alternatives of the procedure were thoroughly explained, informed consent was obtained.  A digital rectal exam revealed no abnormalities of the rectum and A digital rectal exam revealed several skin tags.   The LB [id number]  endoscope was introduced through the anus and advanced to the cecum, which was identified by both the appendix and ileocecal valve. No adverse events experienced.   The quality of the prep was excellent using Suprep  The instrument was then slowly withdrawn as the colon was fully examined.      COLON FINDINGS: Two sessile polyps ranging between 3-41mm in size were found in the proximal transverse colon.  A polypectomy was performed with a cold snare.  The resection was complete and the polyp tissue was completely retrieved.   Mild diverticulosis was noted in the sigmoid colon.   Internal hemorrhoids were found. Retroflexed views revealed no abnormalities. The time to cecum=3 minutes 02 seconds.  Withdrawal time=10 minutes 54 seconds.  The scope was withdrawn and the procedure completed. COMPLICATIONS: There were no complications.  ENDOSCOPIC IMPRESSION: 1.    Two sessile polyps ranging between 3-75mm in size were found in the proximal transverse colon; polypectomy was performed with a cold snare 2.   Mild diverticulosis was noted in the sigmoid colon 3.   Internal hemorrhoids  RECOMMENDATIONS: 1.  Given your significant family history of colon cancer, you should have a repeat colonoscopy in 5 years  eSigned:  Inda Castle, MD 02/14/2014 11:00 AM Revised: 02/14/2014 11:00 AM  cc:   PATIENT NAME:  Karina Davis MR#: [id number]

## 2014-02-14 NOTE — Patient Instructions (Signed)
Discharge instructions given with verbal understanding. Handouts on polyps,diverticulosis and hemorrhoids. Resume previous medications. YOU HAD AN ENDOSCOPIC PROCEDURE TODAY AT THE [facility]: Refer to the procedure report that was given to you for any specific questions about what was found during the examination.  If the procedure report does not answer your questions, please call your gastroenterologist to clarify.  If you requested that your care partner not be given the details of your procedure findings, then the procedure report has been included in a sealed envelope for you to review at your convenience later.  YOU SHOULD EXPECT: Some feelings of bloating in the abdomen. Passage of more gas than usual.  Walking can help get rid of the air that was put into your GI tract during the procedure and reduce the bloating. If you had a lower endoscopy (such as a colonoscopy or flexible sigmoidoscopy) you may notice spotting of blood in your stool or on the toilet paper. If you underwent a bowel prep for your procedure, then you may not have a normal bowel movement for a few days.  DIET: Your first meal following the procedure should be a light meal and then it is ok to progress to your normal diet.  A half-sandwich or bowl of soup is an example of a good first meal.  Heavy or fried foods are harder to digest and may make you feel nauseous or bloated.  Likewise meals heavy in dairy and vegetables can cause extra gas to form and this can also increase the bloating.  Drink plenty of fluids but you should avoid alcoholic beverages for 24 hours.  ACTIVITY: Your care partner should take you home directly after the procedure.  You should plan to take it easy, moving slowly for the rest of the day.  You can resume normal activity the day after the procedure however you should NOT DRIVE or use heavy machinery for 24 hours (because of the sedation medicines used during the test).    SYMPTOMS TO REPORT  IMMEDIATELY: A gastroenterologist can be reached at any hour.  During normal business hours, 8:30 AM to 5:00 PM Monday through Friday, call [phone].  After hours and on weekends, please call the GI answering service at [phone] who will take a message and have the physician on call contact you.   Following lower endoscopy (colonoscopy or flexible sigmoidoscopy):  Excessive amounts of blood in the stool  Significant tenderness or worsening of abdominal pains  Swelling of the abdomen that is new, acute  Fever of 100F or higher  FOLLOW UP: If any biopsies were taken you will be contacted by phone or by letter within the next 1-3 weeks.  Call your gastroenterologist if you have not heard about the biopsies in 3 weeks.  Our staff will call the home number listed on your records the next business day following your procedure to check on you and address any questions or concerns that you may have at that time regarding the information given to you following your procedure. This is a courtesy call and so if there is no answer at the home number and we have not heard from you through the emergency physician on call, we will assume that you have returned to your regular daily activities without incident.  SIGNATURES/CONFIDENTIALITY: You and/or your care partner have signed paperwork which will be entered into your electronic medical record.  These signatures attest to the fact that that the information above on your After Visit Summary   has been reviewed and is understood.  Full responsibility of the confidentiality of this discharge information lies with you and/or your care-partner. 

## 2014-02-14 NOTE — Progress Notes (Signed)
Called to room to assist during endoscopic procedure.  Patient ID and intended procedure confirmed with present staff. Received instructions for my participation in the procedure from the performing physician.  

## 2014-02-15 ENCOUNTER — Telehealth: Payer: Self-pay | Admitting: *Deleted

## 2014-02-15 NOTE — Telephone Encounter (Signed)
  Follow up Call-  Call back number 02/14/2014  Post procedure Call Back phone  # [phone]  Permission to leave phone message Yes     Patient questions:  Do you have a fever, pain , or abdominal swelling? no Pain Score  0 *  Have you tolerated food without any problems? yes  Have you been able to return to your normal activities? yes  Do you have any questions about your discharge instructions: Diet   no Medications  no Follow up visit  no  Do you have questions or concerns about your Care? no  Actions: * If pain score is 4 or above: No action needed, pain <4.

## 2014-02-20 ENCOUNTER — Encounter: Payer: Self-pay | Admitting: Gastroenterology

## 2014-02-28 ENCOUNTER — Telehealth: Payer: Self-pay | Admitting: Gastroenterology

## 2014-03-01 NOTE — Telephone Encounter (Signed)
Pt had questions regarding her pathology report from her polyps. Discussed path results and letter with pt. Pts questions were answered.

## 2014-03-01 NOTE — Telephone Encounter (Signed)
Attempted to return call but mailbox was full.

## 2014-03-12 ENCOUNTER — Encounter (INDEPENDENT_AMBULATORY_CARE_PROVIDER_SITE_OTHER): Payer: Medicare Other | Admitting: Ophthalmology

## 2014-03-12 DIAGNOSIS — E11319 Type 2 diabetes mellitus with unspecified diabetic retinopathy without macular edema: Secondary | ICD-10-CM

## 2014-03-12 DIAGNOSIS — I10 Essential (primary) hypertension: Secondary | ICD-10-CM

## 2014-03-12 DIAGNOSIS — H35039 Hypertensive retinopathy, unspecified eye: Secondary | ICD-10-CM

## 2014-03-12 DIAGNOSIS — H2510 Age-related nuclear cataract, unspecified eye: Secondary | ICD-10-CM

## 2014-03-12 DIAGNOSIS — E1139 Type 2 diabetes mellitus with other diabetic ophthalmic complication: Secondary | ICD-10-CM

## 2014-03-12 DIAGNOSIS — E1165 Type 2 diabetes mellitus with hyperglycemia: Secondary | ICD-10-CM

## 2014-03-12 DIAGNOSIS — H43819 Vitreous degeneration, unspecified eye: Secondary | ICD-10-CM

## 2014-03-20 ENCOUNTER — Other Ambulatory Visit: Payer: Self-pay | Admitting: Physician Assistant

## 2014-03-22 ENCOUNTER — Telehealth: Payer: Self-pay

## 2014-03-22 MED ORDER — LOSARTAN POTASSIUM-HCTZ 100-12.5 MG PO TABS: ORAL_TABLET | ORAL | Status: DC

## 2014-03-22 NOTE — Telephone Encounter (Signed)
losartan-hydrochlorothiazide (HYZAAR) 100-12.5 MG per tablet , needs refill on medicine , [facility] couple of requests and they have been denied, she has an appt. With chelle at then end of the month, please call pt. For refills

## 2014-03-22 NOTE — Telephone Encounter (Signed)
Prescription sent on 5/27 was not received by [facility]. Resent rx.

## 2014-04-05 ENCOUNTER — Ambulatory Visit: Payer: Medicare Other | Admitting: Physician Assistant

## 2014-04-24 ENCOUNTER — Ambulatory Visit (INDEPENDENT_AMBULATORY_CARE_PROVIDER_SITE_OTHER): Payer: Medicare Other

## 2014-04-24 ENCOUNTER — Encounter: Payer: Self-pay | Admitting: Physician Assistant

## 2014-04-24 ENCOUNTER — Ambulatory Visit (INDEPENDENT_AMBULATORY_CARE_PROVIDER_SITE_OTHER): Payer: Medicare Other | Admitting: Emergency Medicine

## 2014-04-24 VITALS — BP 120/80 | HR 69 | Temp 97.6°F | Resp 16 | Ht 64.0 in | Wt 225.0 lb

## 2014-04-24 DIAGNOSIS — M545 Low back pain, unspecified: Secondary | ICD-10-CM

## 2014-04-24 DIAGNOSIS — E119 Type 2 diabetes mellitus without complications: Secondary | ICD-10-CM

## 2014-04-24 DIAGNOSIS — G629 Polyneuropathy, unspecified: Secondary | ICD-10-CM

## 2014-04-24 DIAGNOSIS — G609 Hereditary and idiopathic neuropathy, unspecified: Secondary | ICD-10-CM

## 2014-04-24 DIAGNOSIS — I10 Essential (primary) hypertension: Secondary | ICD-10-CM

## 2014-04-24 DIAGNOSIS — E785 Hyperlipidemia, unspecified: Secondary | ICD-10-CM

## 2014-04-24 DIAGNOSIS — E669 Obesity, unspecified: Secondary | ICD-10-CM

## 2014-04-24 LAB — CBC WITH DIFFERENTIAL/PLATELET
Basophils Absolute: 0 10*3/uL (ref 0.0–0.1)
Basophils Relative: 0 % (ref 0–1)
Eosinophils Absolute: 0.2 10*3/uL (ref 0.0–0.7)
Eosinophils Relative: 2 % (ref 0–5)
HCT: 36.5 % (ref 36.0–46.0)
Hemoglobin: 12.8 g/dL (ref 12.0–15.0)
Lymphocytes Relative: 25 % (ref 12–46)
Lymphs Abs: 2.7 10*3/uL (ref 0.7–4.0)
MCH: 29.2 pg (ref 26.0–34.0)
MCHC: 35.1 g/dL (ref 30.0–36.0)
MCV: 83.3 fL (ref 78.0–100.0)
Monocytes Absolute: 0.6 10*3/uL (ref 0.1–1.0)
Monocytes Relative: 6 % (ref 3–12)
Neutro Abs: 7.1 10*3/uL (ref 1.7–7.7)
Neutrophils Relative %: 67 % (ref 43–77)
Platelets: 357 10*3/uL (ref 150–400)
RBC: 4.38 MIL/uL (ref 3.87–5.11)
RDW: 14.6 % (ref 11.5–15.5)
WBC: 10.6 10*3/uL — ABNORMAL HIGH (ref 4.0–10.5)

## 2014-04-24 LAB — LIPID PANEL
Cholesterol: 212 mg/dL — ABNORMAL HIGH (ref 0–200)
HDL: 35 mg/dL — ABNORMAL LOW
LDL Cholesterol: 134 mg/dL — ABNORMAL HIGH (ref 0–99)
Total CHOL/HDL Ratio: 6.1 ratio
Triglycerides: 214 mg/dL — ABNORMAL HIGH
VLDL: 43 mg/dL — ABNORMAL HIGH (ref 0–40)

## 2014-04-24 LAB — COMPLETE METABOLIC PANEL WITH GFR
ALK PHOS: 103 U/L (ref 39–117)
ALT: 14 U/L (ref 0–35)
AST: 13 U/L (ref 0–37)
Albumin: 4.2 g/dL (ref 3.5–5.2)
BUN: 14 mg/dL (ref 6–23)
CALCIUM: 9.4 mg/dL (ref 8.4–10.5)
CHLORIDE: 100 meq/L (ref 96–112)
CO2: 27 mEq/L (ref 19–32)
Creat: 0.82 mg/dL (ref 0.50–1.10)
GFR, Est African American: 87 mL/min
GFR, Est Non African American: 75 mL/min
Glucose, Bld: 137 mg/dL — ABNORMAL HIGH (ref 70–99)
POTASSIUM: 4.4 meq/L (ref 3.5–5.3)
SODIUM: 136 meq/L (ref 135–145)
TOTAL PROTEIN: 6.6 g/dL (ref 6.0–8.3)
Total Bilirubin: 0.5 mg/dL (ref 0.2–1.2)

## 2014-04-24 LAB — MICROALBUMIN, URINE: Microalb, Ur: 0.5 mg/dL (ref 0.00–1.89)

## 2014-04-24 LAB — POCT GLYCOSYLATED HEMOGLOBIN (HGB A1C): Hemoglobin A1C: 6.5

## 2014-04-24 LAB — TSH: TSH: 6.099 u[IU]/mL — ABNORMAL HIGH (ref 0.350–4.500)

## 2014-04-24 LAB — GLUCOSE, POCT (MANUAL RESULT ENTRY): POC GLUCOSE: 134 mg/dL — AB (ref 70–99)

## 2014-04-24 NOTE — Progress Notes (Signed)
Subjective:    [name], female    DOB: Dec 22, 1945, 68 y.o.   MRN: [id number]   PCP: JEFFERY,CHELLE, PA-C  Chief Complaint  Patient presents with  . Hyperlipidemia  . Hypertension    Medications, allergies, past medical history, surgical history, family history, social history and problem list reviewed and updated.  Patient Active Problem List   Diagnosis Date Noted  . Carious teeth 01/04/2014  . HTN (hypertension) 06/29/2013  . DM (diabetes mellitus), type 2 06/29/2013  . Hyperlipidemia with target LDL less than 70 06/29/2013  . Obesity     Prior to Admission medications   Medication Sig Start Date End Date Taking? Authorizing Provider  Ascorbic Acid (VITAMIN C) 1000 MG tablet Take 1,000 mg by mouth daily.   Yes Historical Provider, MD  aspirin 81 MG tablet Take 81 mg by mouth daily.   Yes Historical Provider, MD  B Complex-C (B-COMPLEX WITH VITAMIN C) tablet Take 1 tablet by mouth daily.   Yes Historical Provider, MD  Calcium-Vitamin D-Vitamin K [id number] MG-UNT-MCG CHEW Chew by mouth.   Yes Historical Provider, MD  Cinnamon 500 MG capsule Take 500 mg by mouth daily.   Yes Historical Provider, MD  gabapentin (NEURONTIN) 300 MG capsule Take 2 capsules (600 mg total) by mouth at bedtime. 10/05/13  Yes Chelle S Jeffery, PA-C  GLUCOSAMINE-CHONDROITIN-VIT D3 PO Take by mouth.   Yes Historical Provider, MD  losartan-hydrochlorothiazide (HYZAAR) 100-12.5 MG per tablet TAKE 1 TABLET BY MOUTH DAILY. 03/22/14  Yes Mancel Bale, PA-C  magnesium oxide (MAG-OX) 400 MG tablet Take 400 mg by mouth daily.   Yes Historical Provider, MD  metFORMIN (GLUCOPHAGE) 500 MG tablet Take 1 tablet (500 mg total) by mouth 2 (two) times daily with a meal. 06/29/13  Yes Chelle S Jeffery, PA-C  Multiple Vitamins-Minerals (CENTRUM SILVER ADULT 50+ PO) Take by mouth.   Yes Historical Provider, MD  Omega-3 Fatty Acids (FISH OIL PO) Take by mouth daily.   Yes Historical Provider, MD  Potassium  Gluconate 595 MG CAPS Take by mouth.   Yes Historical Provider, MD  zoster vaccine live, PF, (ZOSTAVAX) 65784 UNT/0.65ML injection Inject 19,400 Units into the skin once. 01/04/14   Chelle Janalee Dane, PA-C    HPI  Frequency of home glucose monitoring: 1-2 x/week; 120's fasting and 2-3 hours post-prandially Doesn't see a dentist Q6 months, sees eye specialist annually (02/2014, Dr. Zigmund Daniel). Checks feet daily. Is current with influenza vaccine. Is current with pneumococcal-23 vaccine. Needs Prevnar-13.  Is planning to get Zostavax. Missed her mammogram appointment, needs to reschedule.  Continues to feel pins and needles in her feet at night, even with increased gabapentin dose. Aleve PM helps her sleep through it.  Sometimes haves pain in the low back and into the LEFT hip, especially after long sitting or LEFT side lying.   Review of Systems     Objective:   Physical Exam    Results for orders placed in visit on 04/24/14  GLUCOSE, POCT (MANUAL RESULT ENTRY)      Result Value Ref Range   POC Glucose 134 (*) 70 - 99 mg/dl  POCT GLYCOSYLATED HEMOGLOBIN (HGB A1C)      Result Value Ref Range   Hemoglobin A1C 6.5     LS Spine: [facility] reading (PRIMARY) by  Dr. Everlene Farrier. Degenerative changes in the lower thoracic spine, with questionable vertebral body compression at T12. Mild spondylolisthesis L4-5.      Assessment & Plan:  1. Type  II or unspecified type diabetes mellitus without mention of complication, not stated as uncontrolled Continue current treatment and efforts for healthy lifestyle changes. - HM Diabetes Foot Exam - POCT glucose (manual entry) - POCT glycosylated hemoglobin (Hb A1C) - COMPLETE METABOLIC PANEL WITH GFR - Microalbumin, urine  2. Unspecified essential hypertension Controlled. Continue current medication. - CBC with Differential - TSH  3. Hyperlipidemia with target LDL less than 70 Await lab results. - Lipid panel  4. Obesity Healthy lifestyle  changes.  8. Peripheral neuropathy Increase neurontin to 900 mg QHS.  If no better, consider Lyrica.  9. Midline low back pain without sciatica Mild degenerative changes. OTC NSAIDS/acetaminophen prn for now. - DG Lumbar Spine Complete; Future  Return in about 3 months (around 07/25/2014).   Fara Chute, PA-C Physician Assistant-Certified [facility]

## 2014-04-24 NOTE — Patient Instructions (Addendum)
I will contact you with your lab results as soon as they are available.   If you have not heard from me in 2 weeks, please contact me.  The fastest way to get your results is to register for My Chart (see the instructions on the last page of this printout).  Call to re-schedule the mammogram. [phone] Please schedule an appointment with your dentist. INCREASE the gabapentin to 900 mg at bedtime (take 3 of the 300 mg).  If it's not helpful, we can try Lyrica.

## 2014-06-20 ENCOUNTER — Other Ambulatory Visit: Payer: Self-pay | Admitting: Physician Assistant

## 2014-09-25 ENCOUNTER — Telehealth: Payer: Self-pay

## 2014-09-25 MED ORDER — LOSARTAN POTASSIUM-HCTZ 100-12.5 MG PO TABS: 1.0000 | ORAL_TABLET | Freq: Every day | ORAL | Status: DC

## 2014-09-25 NOTE — Telephone Encounter (Signed)
Patient is requesting a refill on losartan-hydrochlorothiazide (HYZAAR) 100-12.5 MG per tablet From Chelle. She receives a 3 month supply and uses the pharmacy [facility] on [address]. She says she is completely out of her medication.    Patient was having difficulty accessing mychart right now. She is currently working on it to [facility] in the future for refills too.

## 2014-09-25 NOTE — Telephone Encounter (Signed)
Refill sent to [facility]. Pt advised need a follow up appt with Chelle- pt transferred to scheduling.

## 2014-11-08 ENCOUNTER — Ambulatory Visit: Payer: Medicare Other | Admitting: Physician Assistant

## 2014-12-27 ENCOUNTER — Other Ambulatory Visit: Payer: Self-pay | Admitting: Physician Assistant

## 2014-12-27 ENCOUNTER — Telehealth: Payer: Self-pay

## 2014-12-27 ENCOUNTER — Other Ambulatory Visit: Payer: Self-pay | Admitting: Emergency Medicine

## 2014-12-27 NOTE — Telephone Encounter (Signed)
Pt is having her pharmacy send [place] over refill requests for both metFORMIN (GLUCOPHAGE) 500 MG tablet [[id number]] and a BP medication. Pt states that she does need an OV, and intends on making one once her insurance straightens outs. Pt would like to know if she can have these refills approved in the mean time. Please advise

## 2014-12-27 NOTE — Telephone Encounter (Signed)
Called pt to let her know her Rx's were sent in. Left detailed message.

## 2015-01-28 ENCOUNTER — Other Ambulatory Visit: Payer: Self-pay

## 2015-01-28 NOTE — Telephone Encounter (Signed)
Patient is requesting a refill on her Losartan 100 mg (Blood Pressure Medication). Patient states she is aware she is due to come in but she currently does not have insurance. She is requesting it to be called in to [facility]. s call back number is [phone]

## 2015-01-29 MED ORDER — LOSARTAN POTASSIUM-HCTZ 100-12.5 MG PO TABS: 1.0000 | ORAL_TABLET | Freq: Every day | ORAL | Status: DC

## 2015-01-29 NOTE — Telephone Encounter (Signed)
Dr Everlene Farrier, do you want to give pt any more RFs of losartan - last seen 03/2014?

## 2015-01-30 NOTE — Telephone Encounter (Signed)
Notified pt RFs were sent. Advised her that she will need to plan to come in before these run out and that we do give a discount to pt's w/out ins. Pt agreed.

## 2015-03-20 ENCOUNTER — Ambulatory Visit (INDEPENDENT_AMBULATORY_CARE_PROVIDER_SITE_OTHER): Payer: Medicare Other | Admitting: Ophthalmology

## 2015-03-20 ENCOUNTER — Telehealth: Payer: Self-pay

## 2015-03-20 NOTE — Telephone Encounter (Signed)
Pt is needing metformin and blood pressure meds refilled   [phone]

## 2015-03-21 MED ORDER — METFORMIN HCL 500 MG PO TABS: 500.0000 mg | ORAL_TABLET | Freq: Two times a day (BID) | ORAL | Status: DC

## 2015-03-21 MED ORDER — LOSARTAN POTASSIUM-HCTZ 100-12.5 MG PO TABS: 1.0000 | ORAL_TABLET | Freq: Every day | ORAL | Status: DC

## 2015-03-21 NOTE — Telephone Encounter (Signed)
Spoke with pt, she states she will have insurance in July. I sent in 2 month but this is the last refill. Advised she has to come in. Pt understood.

## 2015-04-18 ENCOUNTER — Encounter: Payer: Self-pay | Admitting: *Deleted

## 2015-04-22 ENCOUNTER — Other Ambulatory Visit: Payer: Self-pay

## 2015-04-26 ENCOUNTER — Other Ambulatory Visit: Payer: Self-pay

## 2015-04-26 NOTE — Telephone Encounter (Signed)
Pt requesting refill of metformin and losartan

## 2015-04-29 MED ORDER — LOSARTAN POTASSIUM-HCTZ 100-12.5 MG PO TABS: 1.0000 | ORAL_TABLET | Freq: Every day | ORAL | Status: DC

## 2015-04-29 MED ORDER — METFORMIN HCL 500 MG PO TABS: 500.0000 mg | ORAL_TABLET | Freq: Two times a day (BID) | ORAL | Status: DC

## 2015-04-29 NOTE — Telephone Encounter (Signed)
Meds ordered this encounter  Medications  . losartan-hydrochlorothiazide (HYZAAR) 100-12.5 MG per tablet    Sig: Take 1 tablet by mouth daily.    Dispense:  30 tablet    Refill:  1  . metFORMIN (GLUCOPHAGE) 500 MG tablet    Sig: Take 1 tablet (500 mg total) by mouth 2 (two) times daily with a meal.    Dispense:  60 tablet    Refill:  1

## 2015-04-29 NOTE — Telephone Encounter (Signed)
Chelle, pt should actually have a RF at pharm that should last until end of July, but she has an appt sch w/you for Aug 23. Can we go ahead and send in 2 mos of each to cover her through then? She hasn't been in since 04/24/14.

## 2015-04-30 NOTE — Telephone Encounter (Signed)
Tried to call pt to notify but both ph # have full mailboxes and couldn't LM. I'm sure pt will be checking with pharm and will find that we have RFd her meds.

## 2015-04-30 NOTE — Telephone Encounter (Signed)
Pt Cb and I notified her RFs were sent and advised her to be sure to keep appt in Aug. Pt agreed, she was waiting for new ins and it will begin next month.

## 2015-05-06 ENCOUNTER — Ambulatory Visit (INDEPENDENT_AMBULATORY_CARE_PROVIDER_SITE_OTHER): Payer: Self-pay | Admitting: Ophthalmology

## 2015-05-28 ENCOUNTER — Telehealth: Payer: Self-pay

## 2015-05-28 NOTE — Telephone Encounter (Signed)
Pt states she have an appt with Chelle on the 23rd, but will be out of her medicine HYZAAR 100-12.5 MG AND METFORMIN 500 MG. Please call [place]

## 2015-05-29 MED ORDER — METFORMIN HCL 500 MG PO TABS: 500.0000 mg | ORAL_TABLET | Freq: Two times a day (BID) | ORAL | Status: DC

## 2015-05-29 MED ORDER — LOSARTAN POTASSIUM-HCTZ 100-12.5 MG PO TABS: 1.0000 | ORAL_TABLET | Freq: Every day | ORAL | Status: DC

## 2015-05-29 NOTE — Telephone Encounter (Signed)
Rx refilled for 30 days. Pt notified.

## 2015-06-18 ENCOUNTER — Encounter: Payer: Self-pay | Admitting: Physician Assistant

## 2015-06-18 ENCOUNTER — Ambulatory Visit (INDEPENDENT_AMBULATORY_CARE_PROVIDER_SITE_OTHER): Payer: Medicare Other | Admitting: Physician Assistant

## 2015-06-18 VITALS — BP 121/82 | HR 73 | Temp 98.7°F | Resp 18 | Ht 64.0 in | Wt 222.6 lb

## 2015-06-18 DIAGNOSIS — E669 Obesity, unspecified: Secondary | ICD-10-CM

## 2015-06-18 DIAGNOSIS — Z780 Asymptomatic menopausal state: Secondary | ICD-10-CM

## 2015-06-18 DIAGNOSIS — E119 Type 2 diabetes mellitus without complications: Secondary | ICD-10-CM | POA: Diagnosis not present

## 2015-06-18 DIAGNOSIS — Z1231 Encounter for screening mammogram for malignant neoplasm of breast: Secondary | ICD-10-CM | POA: Diagnosis not present

## 2015-06-18 DIAGNOSIS — Z23 Encounter for immunization: Secondary | ICD-10-CM

## 2015-06-18 DIAGNOSIS — E785 Hyperlipidemia, unspecified: Secondary | ICD-10-CM

## 2015-06-18 DIAGNOSIS — I10 Essential (primary) hypertension: Secondary | ICD-10-CM | POA: Diagnosis not present

## 2015-06-18 LAB — COMPLETE METABOLIC PANEL WITH GFR
ALT: 11 U/L (ref 6–29)
AST: 11 U/L (ref 10–35)
Albumin: 3.7 g/dL (ref 3.6–5.1)
Alkaline Phosphatase: 84 U/L (ref 33–130)
BILIRUBIN TOTAL: 0.3 mg/dL (ref 0.2–1.2)
BUN: 18 mg/dL (ref 7–25)
CHLORIDE: 103 mmol/L (ref 98–110)
CO2: 25 mmol/L (ref 20–31)
CREATININE: 0.79 mg/dL (ref 0.50–0.99)
Calcium: 9.8 mg/dL (ref 8.6–10.4)
GFR, Est Non African American: 78 mL/min (ref >=60)
GLUCOSE: 116 mg/dL — AB (ref 65–99)
Potassium: 4.3 mmol/L (ref 3.5–5.3)
SODIUM: 139 mmol/L (ref 135–146)
TOTAL PROTEIN: 6.2 g/dL (ref 6.1–8.1)

## 2015-06-18 LAB — CBC WITH DIFFERENTIAL/PLATELET
BASOS ABS: 0 10*3/uL (ref 0.0–0.1)
Basophils Relative: 0 % (ref 0–1)
EOS ABS: 0.2 10*3/uL (ref 0.0–0.7)
EOS PCT: 2 % (ref 0–5)
HEMATOCRIT: 36.4 % (ref 36.0–46.0)
Hemoglobin: 12 g/dL (ref 12.0–15.0)
LYMPHS ABS: 2.9 10*3/uL (ref 0.7–4.0)
LYMPHS PCT: 28 % (ref 12–46)
MCH: 27.8 pg (ref 26.0–34.0)
MCHC: 33 g/dL (ref 30.0–36.0)
MCV: 84.3 fL (ref 78.0–100.0)
MONO ABS: 0.6 10*3/uL (ref 0.1–1.0)
MONOS PCT: 6 % (ref 3–12)
MPV: 9.8 fL (ref 8.6–12.4)
Neutro Abs: 6.6 10*3/uL (ref 1.7–7.7)
Neutrophils Relative %: 64 % (ref 43–77)
PLATELETS: 336 10*3/uL (ref 150–400)
RBC: 4.32 MIL/uL (ref 3.87–5.11)
RDW: 14.3 % (ref 11.5–15.5)
WBC: 10.3 10*3/uL (ref 4.0–10.5)

## 2015-06-18 LAB — LIPID PANEL
CHOL/HDL RATIO: 5.2 ratio — AB (ref <=5.0)
CHOLESTEROL: 166 mg/dL (ref 125–200)
HDL: 32 mg/dL — ABNORMAL LOW (ref >=46)
LDL CALC: 96 mg/dL (ref <130)
Triglycerides: 188 mg/dL — ABNORMAL HIGH (ref <150)
VLDL: 38 mg/dL — AB (ref <30)

## 2015-06-18 LAB — TSH: TSH: 5.677 u[IU]/mL — ABNORMAL HIGH (ref 0.350–4.500)

## 2015-06-18 LAB — MICROALBUMIN, URINE: Microalb, Ur: 1.5 mg/dL (ref <2.0)

## 2015-06-18 LAB — GLUCOSE, POCT (MANUAL RESULT ENTRY): POC Glucose: 126 mg/dl — AB (ref 70–99)

## 2015-06-18 LAB — POCT GLYCOSYLATED HEMOGLOBIN (HGB A1C): HEMOGLOBIN A1C: 6.3

## 2015-06-18 MED ORDER — METFORMIN HCL 500 MG PO TABS: 500.0000 mg | ORAL_TABLET | Freq: Two times a day (BID) | ORAL | Status: DC

## 2015-06-18 MED ORDER — LOSARTAN POTASSIUM-HCTZ 100-12.5 MG PO TABS: 1.0000 | ORAL_TABLET | Freq: Every day | ORAL | Status: DC

## 2015-06-18 NOTE — Progress Notes (Signed)
[name], female    DOB: [birth year], 69 y.o.   MRN: [id number]  PCP: [place]  Subjective:   Chief Complaint  Patient presents with  . Hypertension  . Hyperlipidemia  . Diabetes    HPI Presents for evaluation of diabetes, HTN and hyperlipidemia..  I haven't seen her in a while, since her insurance changed and she had to negotiate things to get back in to see me. Overall she feel's well. She's making changes in her eating and exercise routines. Tolerating medications without difficulty. Upcoming [place] cruise scheduled.    Review of Systems  Constitutional: Negative for activity change, appetite change, fatigue and unexpected weight change.  HENT: Negative for congestion, dental problem, ear pain, hearing loss, mouth sores, postnasal drip, rhinorrhea, sneezing, sore throat, tinnitus and trouble swallowing.   Eyes: Negative for photophobia, pain, redness and visual disturbance.  Respiratory: Negative for cough, chest tightness and shortness of breath.   Cardiovascular: Negative for chest pain, palpitations and leg swelling.  Gastrointestinal: Negative for nausea, vomiting, abdominal pain, diarrhea, constipation and blood in stool.  Genitourinary: Negative for dysuria, urgency, frequency and hematuria.  Musculoskeletal: Negative for myalgias, arthralgias, gait problem and neck stiffness.  Skin: Negative for rash.  Neurological: Negative for dizziness, speech difficulty, weakness, light-headedness, numbness and headaches.  Hematological: Negative for adenopathy.  Psychiatric/Behavioral: Negative for confusion and sleep disturbance. The patient is not nervous/anxious.        Patient Active Problem List   Diagnosis Date Noted  . Carious teeth 01/04/2014  . HTN (hypertension) 06/29/2013  . DM (diabetes mellitus), type 2 06/29/2013  . Hyperlipidemia with target LDL less than 70 06/29/2013  . Obesity      Prior to Admission medications     Medication Sig Start Date End Date Taking? Authorizing Provider  Ascorbic Acid (VITAMIN C) 1000 MG tablet Take 1,000 mg by mouth daily.   Yes Historical Provider, MD  aspirin 81 MG tablet Take 81 mg by mouth daily.   Yes Historical Provider, MD  B Complex-C (B-COMPLEX WITH VITAMIN C) tablet Take 1 tablet by mouth daily.   Yes Historical Provider, MD  CALCIUM CITRATE-VITAMIN D3 PO Take by mouth.   Yes Historical Provider, MD  Calcium-Vitamin D-Vitamin K [phone] MG-UNT-MCG CHEW Chew by mouth.   Yes Historical Provider, MD  Cinnamon 500 MG capsule Take 500 mg by mouth daily.   Yes Historical Provider, MD  COD LIVER OIL PO Take by mouth.   Yes Historical Provider, MD  GLUCOSAMINE-CHONDROITIN-VIT D3 PO Take by mouth.   Yes Historical Provider, MD  losartan-hydrochlorothiazide (HYZAAR) 100-12.5 MG per tablet Take 1 tablet by mouth daily. 06/18/15  Yes Chandon Lazcano, PA-C  magnesium oxide (MAG-OX) 400 MG tablet Take 400 mg by mouth daily.   Yes Historical Provider, MD  metFORMIN (GLUCOPHAGE) 500 MG tablet Take 1 tablet (500 mg total) by mouth 2 (two) times daily with a meal. 06/18/15  Yes Silverio Hagan, PA-C  Multiple Vitamins-Minerals (CENTRUM SILVER ADULT 50+ PO) Take by mouth.   Yes Historical Provider, MD  Multiple Vitamins-Minerals (MEGA BASIC PO) Take 500 Int'l Units by mouth daily.   Yes Historical Provider, MD  Omega-3 Fatty Acids (FISH OIL PO) Take by mouth daily.   Yes Historical Provider, MD  OVER THE COUNTER MEDICATION 400 mg daily.   Yes Historical Provider, MD  Potassium Gluconate 595 MG CAPS Take by mouth.   Yes Historical Provider, MD  Red Yeast Rice Extract (RED YEAST RICE PO) Take 1,200  mg by mouth daily.   Yes Historical Provider, MD  Vitamin K, Phytonadione, 100 MCG TABS Take by mouth.   Yes Historical Provider, MD  gabapentin (NEURONTIN) 300 MG capsule Take 2 capsules (600 mg total) by mouth at bedtime. Patient not taking: Reported on 06/18/2015 10/05/13   Harrison Mons, PA-C   zoster vaccine live, PF, (ZOSTAVAX) 19509 UNT/0.65ML injection Inject 19,400 Units into the skin once. Patient not taking: Reported on 06/18/2015 01/04/14   Harrison Mons, PA-C     No Known Allergies     Objective:  Physical Exam  Constitutional: She is oriented to person, place, and time. She appears well-developed and well-nourished. No distress.  BP 121/82 mmHg  Pulse 73  Temp(Src) 98.7 F (37.1 C) (Oral)  Resp 18  Ht 5\' 4"  (1.626 m)  Wt 222 lb 9.6 oz (100.971 kg)  BMI 38.19 kg/m2   Eyes: Conjunctivae are normal. No scleral icterus.  Neck: Neck supple. No thyromegaly present.  Cardiovascular: Normal rate, regular rhythm, normal heart sounds and intact distal pulses.   Pulmonary/Chest: Effort normal and breath sounds normal.  Lymphadenopathy:    She has no cervical adenopathy.  Neurological: She is alert and oriented to person, place, and time.  Skin: Skin is warm and dry.  Psychiatric: She has a normal mood and affect. Her behavior is normal.       Results for orders placed or performed in visit on 06/18/15  POCT glucose (manual entry)  Result Value Ref Range   POC Glucose 126 (A) 70 - 99 mg/dl  POCT glycosylated hemoglobin (Hb A1C)  Result Value Ref Range   Hemoglobin A1C 6.3        Assessment & Plan:   1. Type 2 diabetes mellitus without complication Controlled. Continue current treatment and lifestyle changes. - HM Diabetes Foot Exam - POCT glucose (manual entry) - POCT glycosylated hemoglobin (Hb A1C) - COMPLETE METABOLIC PANEL WITH GFR - Microalbumin, urine - metFORMIN (GLUCOPHAGE) 500 MG tablet; Take 1 tablet (500 mg total) by mouth 2 (two) times daily with a meal.  Dispense: 180 tablet; Refill: 1  2. Essential hypertension Controlled. Continue current treatment and lifestyle changes. - CBC with Differential/Platelet - TSH - losartan-hydrochlorothiazide (HYZAAR) 100-12.5 MG per tablet; Take 1 tablet by mouth daily.  Dispense: 90 tablet; Refill:  1  3. Hyperlipidemia with target LDL less than 70 Await lab results. - Lipid panel  4. Obesity See above.  5. Visit for screening mammogram - MM DIGITAL SCREENING BILATERAL; Future  6. Need for shingles vaccine She'll obtain this at her pharmacy, as long as her insurance will cover it.  7. Need for pneumococcal vaccine She will obtain this upon return from her upcoming travel. - Pneumococcal conjugate vaccine 13-valent IM; Future  8. Need for prophylactic vaccination and inoculation against influenza She will obtain this upon return from her upcoming travel.  9. Postmenopausal - DG Bone Density; Future    Return in about 3 months (around 09/18/2015).   Fara Chute, PA-C Physician Assistant-Certified [facility]

## 2015-06-18 NOTE — Patient Instructions (Addendum)
Have a great time on the cruise! Keep up the great work!  When you return, come in for the flu vaccine and the Prevnar-13 vaccine.  You need a mammogram and a bone density test. You may be able to schedule them both to happen at the same time.

## 2015-06-25 ENCOUNTER — Other Ambulatory Visit: Payer: Self-pay

## 2015-06-25 DIAGNOSIS — Z1231 Encounter for screening mammogram for malignant neoplasm of breast: Secondary | ICD-10-CM

## 2015-09-24 ENCOUNTER — Ambulatory Visit: Payer: BLUE CROSS/BLUE SHIELD | Admitting: Physician Assistant

## 2015-10-09 ENCOUNTER — Ambulatory Visit: Payer: Medicare Other | Admitting: Physician Assistant

## 2016-01-30 ENCOUNTER — Other Ambulatory Visit: Payer: Self-pay | Admitting: Physician Assistant

## 2016-03-05 ENCOUNTER — Other Ambulatory Visit: Payer: Self-pay | Admitting: Physician Assistant

## 2016-04-09 ENCOUNTER — Telehealth: Payer: Self-pay

## 2016-04-09 NOTE — Telephone Encounter (Signed)
Patient is calling to request a refill for metformin and losartin. [place]

## 2016-04-11 ENCOUNTER — Other Ambulatory Visit: Payer: Self-pay | Admitting: *Deleted

## 2016-04-11 DIAGNOSIS — I10 Essential (primary) hypertension: Secondary | ICD-10-CM

## 2016-04-11 DIAGNOSIS — E119 Type 2 diabetes mellitus without complications: Secondary | ICD-10-CM

## 2016-04-11 MED ORDER — METFORMIN HCL 500 MG PO TABS: 500.0000 mg | ORAL_TABLET | Freq: Two times a day (BID) | ORAL | Status: DC

## 2016-04-11 MED ORDER — LOSARTAN POTASSIUM-HCTZ 100-12.5 MG PO TABS: 1.0000 | ORAL_TABLET | Freq: Every day | ORAL | Status: DC

## 2016-04-11 NOTE — Telephone Encounter (Signed)
Spoke with Chelle ok to call 90 days in for metformin and losartin patient needs to come in for an appointment before these prescriptions run out.  Patient understood

## 2016-07-03 ENCOUNTER — Telehealth: Payer: Self-pay

## 2016-07-03 NOTE — Telephone Encounter (Signed)
error 

## 2016-07-04 ENCOUNTER — Ambulatory Visit (INDEPENDENT_AMBULATORY_CARE_PROVIDER_SITE_OTHER): Payer: Medicare Other | Admitting: Physician Assistant

## 2016-07-04 VITALS — BP 122/78 | HR 79 | Temp 98.2°F | Resp 16 | Ht 64.0 in | Wt 227.0 lb

## 2016-07-04 DIAGNOSIS — Z1231 Encounter for screening mammogram for malignant neoplasm of breast: Secondary | ICD-10-CM | POA: Diagnosis not present

## 2016-07-04 DIAGNOSIS — E669 Obesity, unspecified: Secondary | ICD-10-CM | POA: Diagnosis not present

## 2016-07-04 DIAGNOSIS — E785 Hyperlipidemia, unspecified: Secondary | ICD-10-CM

## 2016-07-04 DIAGNOSIS — E1121 Type 2 diabetes mellitus with diabetic nephropathy: Secondary | ICD-10-CM

## 2016-07-04 DIAGNOSIS — Z23 Encounter for immunization: Secondary | ICD-10-CM

## 2016-07-04 DIAGNOSIS — E119 Type 2 diabetes mellitus without complications: Secondary | ICD-10-CM

## 2016-07-04 DIAGNOSIS — Z780 Asymptomatic menopausal state: Secondary | ICD-10-CM

## 2016-07-04 DIAGNOSIS — I10 Essential (primary) hypertension: Secondary | ICD-10-CM | POA: Diagnosis not present

## 2016-07-04 LAB — CBC WITH DIFFERENTIAL/PLATELET
BASOS ABS: 0 {cells}/uL (ref 0–200)
Basophils Relative: 0 %
EOS PCT: 1 %
Eosinophils Absolute: 94 cells/uL (ref 15–500)
HCT: 36.3 % (ref 35.0–45.0)
HEMOGLOBIN: 11.8 g/dL (ref 11.7–15.5)
LYMPHS ABS: 2350 {cells}/uL (ref 850–3900)
LYMPHS PCT: 25 %
MCH: 28.1 pg (ref 27.0–33.0)
MCHC: 32.5 g/dL (ref 32.0–36.0)
MCV: 86.4 fL (ref 80.0–100.0)
MONOS PCT: 6 %
MPV: 9.6 fL (ref 7.5–12.5)
Monocytes Absolute: 564 cells/uL (ref 200–950)
NEUTROS PCT: 68 %
Neutro Abs: 6392 cells/uL (ref 1500–7800)
PLATELETS: 317 10*3/uL (ref 140–400)
RBC: 4.2 MIL/uL (ref 3.80–5.10)
RDW: 14.1 % (ref 11.0–15.0)
WBC: 9.4 10*3/uL (ref 3.8–10.8)

## 2016-07-04 LAB — LIPID PANEL
CHOL/HDL RATIO: 5.4 ratio — AB (ref <=5.0)
Cholesterol: 195 mg/dL (ref 125–200)
HDL: 36 mg/dL — AB (ref >=46)
LDL Cholesterol: 118 mg/dL (ref <130)
Triglycerides: 205 mg/dL — ABNORMAL HIGH (ref <150)
VLDL: 41 mg/dL — AB (ref <30)

## 2016-07-04 LAB — COMPREHENSIVE METABOLIC PANEL
ALBUMIN: 4 g/dL (ref 3.6–5.1)
ALT: 18 U/L (ref 6–29)
AST: 21 U/L (ref 10–35)
Alkaline Phosphatase: 79 U/L (ref 33–130)
BILIRUBIN TOTAL: 0.4 mg/dL (ref 0.2–1.2)
BUN: 13 mg/dL (ref 7–25)
CHLORIDE: 100 mmol/L (ref 98–110)
CO2: 25 mmol/L (ref 20–31)
CREATININE: 0.86 mg/dL (ref 0.50–0.99)
Calcium: 9.3 mg/dL (ref 8.6–10.4)
GLUCOSE: 187 mg/dL — AB (ref 65–99)
Potassium: 4 mmol/L (ref 3.5–5.3)
SODIUM: 139 mmol/L (ref 135–146)
Total Protein: 6.8 g/dL (ref 6.1–8.1)

## 2016-07-04 MED ORDER — LOSARTAN POTASSIUM 100 MG PO TABS: 100.0000 mg | ORAL_TABLET | Freq: Every day | ORAL | 3 refills | Status: DC

## 2016-07-04 MED ORDER — METFORMIN HCL 500 MG PO TABS: 500.0000 mg | ORAL_TABLET | Freq: Two times a day (BID) | ORAL | 3 refills | Status: DC

## 2016-07-04 MED ORDER — ZOSTER VACCINE LIVE 19400 UNT/0.65ML ~~LOC~~ SUSR: 0.6500 mL | Freq: Once | SUBCUTANEOUS | 0 refills | Status: AC

## 2016-07-04 MED ORDER — HYDROCHLOROTHIAZIDE 25 MG PO TABS: 25.0000 mg | ORAL_TABLET | Freq: Every day | ORAL | 3 refills | Status: DC

## 2016-07-04 NOTE — Patient Instructions (Addendum)
     IF you received an x-ray today, you will receive an invoice from [facility]. Please contact [facility] at [phone] with questions or concerns regarding your invoice.   IF you received labwork today, you will receive an invoice from [facility]. Please contact [facility] at [phone] with questions or concerns regarding your invoice.   Our billing staff will not be able to assist you with questions regarding bills from these companies.  You will be contacted with the lab results as soon as they are available. The fastest way to get your results is to activate your My Chart account. Instructions are located on the last page of this paperwork. If you have not heard from us regarding the results in 2 weeks, please contact this office.     We recommend that you schedule a mammogram for breast cancer screening. Typically, you do not need a referral to do this. Please contact a local imaging center to schedule your mammogram.  [facility] - [phone]  *ask for the Radiology Department [facility]) - [phone] or [phone]  [facility] - [phone] [facility] - [phone] [facility] - [phone]  *ask for the Radiology Department [facility] - [phone]  *ask for the Radiology Department [facility] - [phone]  *ask for the Mammography Department [facility] - [phone]  

## 2016-07-04 NOTE — Progress Notes (Signed)
[name], female    DOB: 1946/04/09, 70 y.o.   MRN: [id number]  PCP: Harrison Mons, PA-C  Subjective:   Chief Complaint  Patient presents with  . Medication Refill    Losartan - pt has been taking this med BID because her pressure has been high     HPI Presents for evaluation of diabetes and HTN and needs prescription refills. I last saw her 05/2015.  Her blood pressure at home has been running high (systolic [id number], occasionally >200), so she has increased her losartanHCT from QD to BID. She's been out x 2 days.  Increased stress recently, due to her sister's declining health and helping with that.  Stopped gabapentin due to lack of benefit and side effect of dizziness. Feet still tingle and burn, worse at night.  Her sister had bad side effects from Lyrica, so she doesn't want to take that. Really doesn't want to take medications at all. "I know what to do, I'm just not doing it."  Eye exam is over due. Never got the shingles vaccine, had a mammogram or DEXA, as ordered last year.  Leaves for a trip to [place] with her husband and daughter in 2 days.       Review of Systems  Constitutional: Negative.   HENT: Negative.   Eyes: Negative.   Respiratory: Positive for shortness of breath (only with a lot of activity (stair climbing), none with her routine activities). Negative for apnea, cough, choking, chest tightness, wheezing and stridor.   Cardiovascular: Positive for leg swelling (when she's on her feet for prolonged times). Negative for chest pain and palpitations.  Gastrointestinal: Positive for diarrhea (sometimes, associated with some urgency). Negative for abdominal distention, abdominal pain, anal bleeding, blood in stool, constipation, nausea, rectal pain and vomiting.  Endocrine: Negative.   Genitourinary: Negative.   Musculoskeletal: Negative.   Allergic/Immunologic: Negative.   Neurological: Positive for numbness (burning sensation in the  feet). Negative for dizziness, tremors, seizures, syncope, facial asymmetry, speech difficulty, weakness, light-headedness and headaches.  Hematological: Negative.        Patient Active Problem List   Diagnosis Date Noted  . Carious teeth 01/04/2014  . HTN (hypertension) 06/29/2013  . DM (diabetes mellitus), type 2 ([place]) 06/29/2013  . Hyperlipidemia with target LDL less than 70 06/29/2013  . Obesity      Prior to Admission medications   Medication Sig Start Date End Date Taking? Authorizing Provider  Ascorbic Acid (VITAMIN C) 1000 MG tablet Take 1,000 mg by mouth daily.   Yes Historical Provider, MD  aspirin 81 MG tablet Take 81 mg by mouth daily.   Yes Historical Provider, MD  B Complex-C (B-COMPLEX WITH VITAMIN C) tablet Take 1 tablet by mouth daily.   Yes Historical Provider, MD  CALCIUM CITRATE-VITAMIN D3 PO Take by mouth.   Yes Historical Provider, MD  Calcium-Vitamin D-Vitamin K [id number] MG-UNT-MCG CHEW Chew by mouth.   Yes Historical Provider, MD  Cinnamon 500 MG capsule Take 500 mg by mouth daily.   Yes Historical Provider, MD  COD LIVER OIL PO Take by mouth.   Yes Historical Provider, MD  GLUCOSAMINE-CHONDROITIN-VIT D3 PO Take by mouth.   Yes Historical Provider, MD  losartan-hydrochlorothiazide (HYZAAR) 100-12.5 MG tablet Take 1 tablet by mouth daily. 04/11/16  Yes Yariel Ferraris, PA-C  magnesium oxide (MAG-OX) 400 MG tablet Take 400 mg by mouth daily.   Yes Historical Provider, MD  metFORMIN (GLUCOPHAGE) 500 MG tablet Take 1 tablet (500 mg total)  by mouth 2 (two) times daily with a meal. 04/11/16  Yes Cace Osorto, PA-C  Multiple Vitamins-Minerals (CENTRUM SILVER ADULT 50+ PO) Take by mouth.   Yes Historical Provider, MD  Multiple Vitamins-Minerals (MEGA BASIC PO) Take 500 Int'l Units by mouth daily.   Yes Historical Provider, MD  Omega-3 Fatty Acids (FISH OIL PO) Take by mouth daily.   Yes Historical Provider, MD  OVER THE COUNTER MEDICATION 400 mg daily.   Yes  Historical Provider, MD  Potassium Gluconate 595 MG CAPS Take by mouth.   Yes Historical Provider, MD  Red Yeast Rice Extract (RED YEAST RICE PO) Take 1,200 mg by mouth daily.   Yes Historical Provider, MD  Vitamin K, Phytonadione, 100 MCG TABS Take by mouth.   Yes Historical Provider, MD  zoster vaccine live, PF, (ZOSTAVAX) 29562 UNT/0.65ML injection Inject 19,400 Units into the skin once. 01/04/14  Yes Jakory Matsuo, PA-C     No Known Allergies     Objective:  Physical Exam  Constitutional: She is oriented to person, place, and time. She appears well-developed and well-nourished. She is active and cooperative. No distress.  BP 122/78   Pulse 79   Temp 98.2 F (36.8 C) (Oral)   Resp 16   Ht 5\' 4"  (1.626 m)   Wt 227 lb (103 kg)   SpO2 98%   BMI 38.96 kg/m   HENT:  Head: Normocephalic and atraumatic.  Right Ear: Hearing normal.  Left Ear: Hearing normal.  Eyes: Conjunctivae are normal. No scleral icterus.  Neck: Normal range of motion. Neck supple. No thyromegaly present.  Cardiovascular: Normal rate, regular rhythm and normal heart sounds.   Pulses:      Radial pulses are 2+ on the right side, and 2+ on the left side.  Pulmonary/Chest: Effort normal and breath sounds normal.  Lymphadenopathy:       Head (right side): No tonsillar, no preauricular, no posterior auricular and no occipital adenopathy present.       Head (left side): No tonsillar, no preauricular, no posterior auricular and no occipital adenopathy present.    She has no cervical adenopathy.       Right: No supraclavicular adenopathy present.       Left: No supraclavicular adenopathy present.  Neurological: She is alert and oriented to person, place, and time. No sensory deficit.  Skin: Skin is warm, dry and intact. No rash noted. No cyanosis or erythema. Nails show no clubbing.  Psychiatric: She has a normal mood and affect. Her speech is normal and behavior is normal.    Diabetic Foot Exam - Simple   Simple  Foot Form Diabetic Foot exam was performed with the following findings:  Yes 07/04/2016  2:25 PM  Visual Inspection No deformities, no ulcerations, no other skin breakdown bilaterally:  Yes Sensation Testing Intact to touch and monofilament testing bilaterally:  Yes Pulse Check Posterior Tibialis and Dorsalis pulse intact bilaterally:  Yes Comments           Assessment & Plan:   1. Type 2 diabetes mellitus without complication, without long-term current use of insulin ([place]) Await labs. Adjust regimen as indicated by results. Continue working on healthier eating habits and regular exercise. Schedule eye exam. - Comprehensive metabolic panel - Hemoglobin A1c - HM Diabetes Foot Exam - HM Diabetes Eye Exam - metFORMIN (GLUCOPHAGE) 500 MG tablet; Take 1 tablet (500 mg total) by mouth 2 (two) times daily with a meal.  Dispense: 180 tablet; Refill: 3  2. Essential  hypertension Controlled, off meds x 2 days, which is interesting. Restart, but separate losartan (which she wants to take at HS due to somnolence) and HCTZ (advised to take in the AM) at the higher dose. She will bring her BP cuff with her when she comes in for her wellness visit next month and we can compare the readings with ours. - CBC with Differential/Platelet - Comprehensive metabolic panel - losartan (COZAAR) 100 MG tablet; Take 1 tablet (100 mg total) by mouth daily.  Dispense: 90 tablet; Refill: 3 - hydrochlorothiazide (HYDRODIURIL) 25 MG tablet; Take 1 tablet (25 mg total) by mouth daily with breakfast.  Dispense: 90 tablet; Refill: 3  3. Hyperlipidemia with target LDL less than 70 Await labs. Adjust regimen as indicated by results. - Comprehensive metabolic panel - Lipid panel  4. Obesity Increase efforts for healthier eating habits and regular exercise.  5. Need for shingles vaccine - Zoster Vaccine Live, PF, (ZOSTAVAX) 56387 UNT/0.65ML injection; Inject 19,400 Units into the skin once.  Dispense: 1 each;  Refill: 0  6. Need for pneumococcal vaccine - Pneumococcal conjugate vaccine 13-valent IM  7. Need for prophylactic vaccination and inoculation against influenza - Flu Vaccine QUAD 36+ mos IM  8. Postmenopausal - DG Bone Density; Future  9. Visit for screening mammogram - MM DIGITAL SCREENING BILATERAL; Future   Fara Chute, PA-C Physician Assistant-Certified [facility]

## 2016-07-05 LAB — HEMOGLOBIN A1C
Hgb A1c MFr Bld: 7 % — ABNORMAL HIGH (ref <5.7)
MEAN PLASMA GLUCOSE: 154 mg/dL

## 2016-08-25 ENCOUNTER — Encounter: Payer: Medicare Other | Admitting: Physician Assistant

## 2016-08-25 ENCOUNTER — Telehealth: Payer: Self-pay

## 2016-08-31 NOTE — Telephone Encounter (Signed)
error 

## 2016-09-01 ENCOUNTER — Encounter: Payer: Self-pay | Admitting: Physician Assistant

## 2016-09-01 ENCOUNTER — Ambulatory Visit (INDEPENDENT_AMBULATORY_CARE_PROVIDER_SITE_OTHER): Payer: Medicare Other | Admitting: Physician Assistant

## 2016-09-01 VITALS — BP 144/90 | HR 79 | Temp 98.3°F | Resp 16 | Ht 61.5 in | Wt 221.4 lb

## 2016-09-01 DIAGNOSIS — E1121 Type 2 diabetes mellitus with diabetic nephropathy: Secondary | ICD-10-CM

## 2016-09-01 DIAGNOSIS — E785 Hyperlipidemia, unspecified: Secondary | ICD-10-CM

## 2016-09-01 DIAGNOSIS — Z9189 Other specified personal risk factors, not elsewhere classified: Secondary | ICD-10-CM | POA: Diagnosis not present

## 2016-09-01 DIAGNOSIS — Z0000 Encounter for general adult medical examination without abnormal findings: Secondary | ICD-10-CM

## 2016-09-01 DIAGNOSIS — Z780 Asymptomatic menopausal state: Secondary | ICD-10-CM | POA: Diagnosis not present

## 2016-09-01 DIAGNOSIS — Z23 Encounter for immunization: Secondary | ICD-10-CM

## 2016-09-01 DIAGNOSIS — I10 Essential (primary) hypertension: Secondary | ICD-10-CM | POA: Diagnosis not present

## 2016-09-01 DIAGNOSIS — K1370 Unspecified lesions of oral mucosa: Secondary | ICD-10-CM

## 2016-09-01 DIAGNOSIS — Z1231 Encounter for screening mammogram for malignant neoplasm of breast: Secondary | ICD-10-CM | POA: Diagnosis not present

## 2016-09-01 LAB — POCT URINALYSIS DIP (MANUAL ENTRY)
Bilirubin, UA: NEGATIVE
Blood, UA: NEGATIVE
Glucose, UA: NEGATIVE
Ketones, POC UA: NEGATIVE
Leukocytes, UA: NEGATIVE
Nitrite, UA: NEGATIVE
Protein Ur, POC: NEGATIVE
Spec Grav, UA: 1.01
Urobilinogen, UA: 0.2
pH, UA: 6

## 2016-09-01 LAB — TSH: TSH: 4.14 mIU/L

## 2016-09-01 MED ORDER — CHLORHEXIDINE GLUCONATE 0.12% ORAL RINSE (MEDLINE KIT): 15.0000 mL | Freq: Two times a day (BID) | OROMUCOSAL | 0 refills | Status: DC

## 2016-09-01 MED ORDER — ZOSTER VACCINE LIVE 19400 UNT/0.65ML ~~LOC~~ SUSR: 0.6500 mL | Freq: Once | SUBCUTANEOUS | 0 refills | Status: AC

## 2016-09-01 NOTE — Progress Notes (Signed)
[name], female   DOB: 1946/06/25, 70 y.o.   MRN: [id number]  Presents today for Medicare Annual Wellness Visit-Subsequent.  Date of last exam: 07/04/2016  Check past abnormal pap smears  Interpreter used for this visit? No  Patient Care Team: Harrison Mons, PA-C as PCP - General (Physician Assistant)   Other items to address today: Notes some numbness and burning pain in her feet. Used to take Gabapentin but states it made her feel "drunk and dizzy" so she stopped taking it on her own. Uses Frankensense and [place], shea butter and hemp, on it at night and provides some relief, feels as though it is manageable with this regimen. Causes more pain at night. Sometimes associated itching on her feet with this as well.  Recently with some left ear and mouth pain. States she had an earache last week without associated congestion, sore throat, cough, ear discharge, tinnitus. Mouth pain in gums with associated redness and swelling. Patient with poor dentition and has been trying to get it fixed but states right now she does not have the money. Has taken 5 of 8 days of an Amoxicillin prescription which has provided much improvement for both her ear and mouth.   Cancer Screening: Cervical: Last pap 01/04/2014, patient states no abnormals for over 20 years Breast: Per patient last one 20 years ago Colon: 02/14/2014  Other Screening: Last screening for diabetes: Patient has diabetes Last lipid screening: Patient has hyperlipidemia   ADVANCE DIRECTIVES: Discussed: Yes, no current advanced directive, plans to discuss with family On File: No Materials Provided: Yes  Immunization status: Still needs Zostavax   Home Environment: [place] home with husband, steps to outside area. Able to go on steps on her own but does not often. Performs ADLs on own.   Patient Active Problem List   Diagnosis Date Noted  . Carious teeth 01/04/2014  . HTN (hypertension) 06/29/2013  . DM  (diabetes mellitus), type 2 ([place]) 06/29/2013  . Hyperlipidemia with target LDL less than 70 06/29/2013  . Obesity      Past Medical History:  Diagnosis Date  . Ankle fracture   . Diabetes mellitus without complication ([place])   . Hypertension   . Obesity      Past Surgical History:  Procedure Laterality Date  . APPENDECTOMY  1955   childhood  . colon repair       Family History  Problem Relation Age of Onset  . Cancer Mother 97    Colon  . Colon cancer Mother 74  . Stroke Father 74  . Hypertension Sister   . Diabetes Sister   . Kidney disease Sister     due to HTN and DM  . Hypertension Brother   . Stroke Brother   . Hypertension Daughter   . Diabetes Daughter   . Obesity Daughter     gastric sleeve surgery 03/05/2014  . Hypertension Sister   . Drug abuse Sister     narcotic abuse  . Stroke Sister     associated with narcotic abuse  . Diabetes Sister   . Hypertension Sister   . Hyperlipidemia Sister   . Hypertension Brother   . Drug abuse Son     cocaine     Social History   Social History  . Marital status: Married    Spouse name: Tanija Germani, Sr.  . Number of children: 2  . Years of education: 12+   Occupational History  . realtor   . car dealer  Social History Main Topics  . Smoking status: Never Smoker  . Smokeless tobacco: Never Used  . Alcohol use No  . Drug use: No  . Sexual activity: Not Currently   Other Topics Concern  . Not on file   Social History Narrative   Lives with her husband.  Her adult children live locally.     No Known Allergies   Prior to Admission medications   Medication Sig Start Date End Date Taking? Authorizing Provider  Ascorbic Acid (VITAMIN C) 1000 MG tablet Take 1,000 mg by mouth daily.    Historical Provider, MD  aspirin 81 MG tablet Take 81 mg by mouth daily.    Historical Provider, MD  B Complex-C (B-COMPLEX WITH VITAMIN C) tablet Take 1 tablet by mouth daily.    Historical Provider, MD   CALCIUM CITRATE-VITAMIN D3 PO Take by mouth.    Historical Provider, MD  Calcium-Vitamin D-Vitamin K [id number] MG-UNT-MCG CHEW Chew by mouth.    Historical Provider, MD  Cinnamon 500 MG capsule Take 500 mg by mouth daily.    Historical Provider, MD  COD LIVER OIL PO Take by mouth.    Historical Provider, MD  GLUCOSAMINE-CHONDROITIN-VIT D3 PO Take by mouth.    Historical Provider, MD  hydrochlorothiazide (HYDRODIURIL) 25 MG tablet Take 1 tablet (25 mg total) by mouth daily with breakfast. 07/04/16   Chelle Jeffery, PA-C  losartan (COZAAR) 100 MG tablet Take 1 tablet (100 mg total) by mouth daily. 07/04/16   Chelle Jeffery, PA-C  magnesium oxide (MAG-OX) 400 MG tablet Take 400 mg by mouth daily.    Historical Provider, MD  metFORMIN (GLUCOPHAGE) 500 MG tablet Take 1 tablet (500 mg total) by mouth 2 (two) times daily with a meal. 07/04/16   Chelle Jeffery, PA-C  Multiple Vitamins-Minerals (CENTRUM SILVER ADULT 50+ PO) Take by mouth.    Historical Provider, MD  Multiple Vitamins-Minerals (MEGA BASIC PO) Take 500 Int'l Units by mouth daily.    Historical Provider, MD  Omega-3 Fatty Acids (FISH OIL PO) Take by mouth daily.    Historical Provider, MD  OVER THE COUNTER MEDICATION 400 mg daily.    Historical Provider, MD  Potassium Gluconate 595 MG CAPS Take by mouth.    Historical Provider, MD  Red Yeast Rice Extract (RED YEAST RICE PO) Take 1,200 mg by mouth daily.    Historical Provider, MD  Vitamin K, Phytonadione, 100 MCG TABS Take by mouth.    Historical Provider, MD     Depression screen [facility] 2/9 06/18/2015 01/04/2014 06/29/2013  Decreased Interest 0 0 0  Down, Depressed, Hopeless 0 0 0  PHQ - 2 Score 0 0 0     Fall Risk  06/18/2015 01/04/2014  Falls in the past year? No No  Risk for fall due to : - Medication side effect       Functional Status Survey: Is the patient deaf or have difficulty hearing?: No Does the patient have difficulty seeing, even when wearing glasses/contacts?: No Does the  patient have difficulty concentrating, remembering, or making decisions?: No Does the patient have difficulty walking or climbing stairs?: No Does the patient have difficulty dressing or bathing?: No Does the patient have difficulty doing errands alone such as visiting a doctor's office or shopping?: No   Evaluation of Cognitive Function: Mood/affect: Pleasant and cooperative Appearance: Well-groomed but with poor dentition. Family Member/caregiver input: N/A    PHYSICAL EXAM: Blood pressure (!) 144/90, pulse 79, temperature 98.3 F (36.8 C), temperature source Oral,  resp. rate 16, height 5' 1.5" (1.562 m), weight 221 lb 6.4 oz (100.4 kg), SpO2 95 %.  Wt Readings from Last 3 Encounters:  07/04/16 227 lb (103 kg)  06/18/15 222 lb 9.6 oz (101 kg)  04/24/14 225 lb (102.1 kg)   No exam data present   Physical Exam  Constitutional: She is oriented to person, place, and time. She appears well-developed and well-nourished.  HENT:  Head: Normocephalic and atraumatic.  Right Ear: External ear normal. No drainage, swelling or tenderness. Tympanic membrane is not injected, not scarred, not perforated, not erythematous, not retracted and not bulging. No middle ear effusion.  Left Ear: External ear normal. No drainage, swelling or tenderness. Tympanic membrane is injected. Tympanic membrane is not scarred, not perforated, not erythematous, not retracted and not bulging.  No middle ear effusion.  Mouth/Throat: Uvula is midline. Mucous membranes are not pale, not dry and not cyanotic. She does not have dentures. No oral lesions. Abnormal dentition. No dental abscesses or lacerations. No oropharyngeal exudate or posterior oropharyngeal edema.  Poor dentition with foul odor from oral cavity. Erythema and swelling of gums, particularly on right upper gum.  Very mild injection of left TM, appears to be resolving.  Eyes: Conjunctivae are normal. Pupils are equal, round, and reactive to light. Right  eye exhibits no discharge. Left eye exhibits no discharge. No scleral icterus.  Neck: Normal range of motion. Neck supple. No tracheal deviation present. No thyromegaly present.  Cardiovascular: Normal rate, regular rhythm, normal heart sounds and intact distal pulses.   Respiratory: Effort normal and breath sounds normal. No respiratory distress. She has no wheezes. She has no rales.  GI: Soft. Bowel sounds are normal. She exhibits no distension and no mass. There is no tenderness. There is no rebound and no guarding.  Lymphadenopathy:    She has no cervical adenopathy.  Neurological: She is alert and oriented to person, place, and time.  Skin: Skin is warm and dry. No rash noted. No erythema.  Psychiatric: She has a normal mood and affect. Her behavior is normal.      Education/Counseling: Yes diet and exercise Yes prevention of chronic diseases N/A smoking/tobacco cessation Yes review "Covered Medicare Preventive Services"    ASSESSMENT/PLAN: 1. Medicare annual wellness visit, subsequent Age-appropriate anticipatory guidance provided. UTD on health maintenance after today's visit.  2. Encounter for screening mammogram for breast cancer Has not received screening mammogram in many years. - MM Digital Screening; Future  3. Need for shingles vaccine - Zoster Vaccine Live, PF, (ZOSTAVAX) 86381 UNT/0.65ML injection; Inject 19,400 Units into the skin once.  Dispense: 0.65 mL; Refill: 0  4. Postmenopausal estrogen deficiency Has not yet received screening DEXA scan  - DG Bone Density; Future  5. Essential hypertension Currently moderately controlled on HCTZ and Losartan. Continue medication and will make changes as needed upon review of lab results. - TSH - POCT urinalysis dipstick - Urine Microscopic  6. Type 2 diabetes mellitus with diabetic nephropathy, without long-term current use of insulin ([facility]) Currently controlled on Metformin. Continue as prescribed and will make  changes as needed upon review of lab results. - Microalbumin, urine  7. Hyperlipidemia with target LDL less than 70 Screening lipid panel obtained today, will consider addition of statin to medication regimen upon review of lab results.  8. Poor oral hygiene 9. Lesion of oral mucosa - chlorhexidine gluconate, MEDLINE KIT, (PERIDEX) 0.12 % solution; Use as directed 15 mLs in the mouth or throat 2 (two) times daily.  Dispense: 120 mL; Refill: 0 - Ambulatory referral to Oral Maxillofacial Surgery

## 2016-09-01 NOTE — Progress Notes (Signed)
Presents today for [employer] Visit-Subsequent.   Date of last exam: 01/04/2014 for wellness  Interpreter used for this visit? no  Patient Care Team: Harrison Mons, PA-C as PCP - General (Physician Assistant)   Other items to address today:  1. Numbness and burning pain in the feet. Previously took gabapentin but felt "drunk and dizzy." Uses frankincense and myrrh, shea butter and hemp products at night, which seem to help. 2. LEFT ear pain last week. No associated congestion, sore throat, cough. Pain has resolved. 3. Mouth pain. Known periodontal disease, but hasn't been able to afford a dental visit. Thought there might be an infection, so has taken 5 of 8 days of amoxicillin she purchased without a prescription.   Cancer Screening: Cervical: h/o cryotherapy about 20 years ago, no abnormal paps since then. Last pap 2 years ago, with HPV, normal/negative. Breast: last mammogram about 20 years ago Colon: yes, 02/14/2014  Prostate: n/a   Other Screening: Last screening for diabetes: patient has diabetes Last lipid screening: patient has hyperlipidemia   ADVANCE DIRECTIVES: Discussed: yes On File: no Materials Provided: yes   Immunization status: missing doses of Zostavax.  Home Environment: lives in a single story home with her husband.     Patient Active Problem List   Diagnosis Date Noted  . Carious teeth 01/04/2014  . HTN (hypertension) 06/29/2013  . DM (diabetes mellitus), type 2 ([place]) 06/29/2013  . Hyperlipidemia with target LDL less than 70 06/29/2013  . Obesity      Past Medical History:  Diagnosis Date  . Ankle fracture   . Diabetes mellitus without complication ([place])   . Hypertension   . Obesity      Past Surgical History:  Procedure Laterality Date  . APPENDECTOMY  1955   childhood  . colon repair       Family History  Problem Relation Age of Onset  . Cancer Mother 20    Colon  . Colon cancer Mother 48  . Stroke Father 71    . Hypertension Sister   . Diabetes Sister   . Kidney disease Sister     due to HTN and DM  . Hypertension Brother   . Stroke Brother   . Hypertension Daughter   . Diabetes Daughter   . Obesity Daughter     gastric sleeve surgery 03/05/2014  . Hypertension Sister   . Drug abuse Sister     narcotic abuse  . Stroke Sister     associated with narcotic abuse  . Diabetes Sister   . Hypertension Sister   . Hyperlipidemia Sister   . Hypertension Brother   . Drug abuse Son     cocaine     Social History   Social History  . Marital status: Married    Spouse name: Lisia Westbay, Sr.  . Number of children: 2  . Years of education: 12+   Occupational History  . realtor   . car dealer    Social History Main Topics  . Smoking status: Never Smoker  . Smokeless tobacco: Never Used  . Alcohol use No  . Drug use: No  . Sexual activity: Not Currently   Other Topics Concern  . Not on file   Social History Narrative   Lives with her husband.  Her adult children live locally.     No Known Allergies   Prior to Admission medications   Medication Sig Start Date End Date Taking? Authorizing Provider  Ascorbic Acid (VITAMIN C) 1000  MG tablet Take 1,000 mg by mouth daily.    Historical Provider, MD  aspirin 81 MG tablet Take 81 mg by mouth daily.    Historical Provider, MD  B Complex-C (B-COMPLEX WITH VITAMIN C) tablet Take 1 tablet by mouth daily.    Historical Provider, MD  CALCIUM CITRATE-VITAMIN D3 PO Take by mouth.    Historical Provider, MD  Calcium-Vitamin D-Vitamin K 110-315-94 MG-UNT-MCG CHEW Chew by mouth.    Historical Provider, MD  Cinnamon 500 MG capsule Take 500 mg by mouth daily.    Historical Provider, MD  COD LIVER OIL PO Take by mouth.    Historical Provider, MD  GLUCOSAMINE-CHONDROITIN-VIT D3 PO Take by mouth.    Historical Provider, MD  hydrochlorothiazide (HYDRODIURIL) 25 MG tablet Take 1 tablet (25 mg total) by mouth daily with breakfast. 07/04/16   Lekeisha Arenas, PA-C  losartan (COZAAR) 100 MG tablet Take 1 tablet (100 mg total) by mouth daily. 07/04/16   Marsha Gundlach, PA-C  magnesium oxide (MAG-OX) 400 MG tablet Take 400 mg by mouth daily.    Historical Provider, MD  metFORMIN (GLUCOPHAGE) 500 MG tablet Take 1 tablet (500 mg total) by mouth 2 (two) times daily with a meal. 07/04/16   Buena Boehm, PA-C  Multiple Vitamins-Minerals (CENTRUM SILVER ADULT 50+ PO) Take by mouth.    Historical Provider, MD  Multiple Vitamins-Minerals (MEGA BASIC PO) Take 500 Int'l Units by mouth daily.    Historical Provider, MD  Omega-3 Fatty Acids (FISH OIL PO) Take by mouth daily.    Historical Provider, MD  OVER THE COUNTER MEDICATION 400 mg daily.    Historical Provider, MD  Potassium Gluconate 595 MG CAPS Take by mouth.    Historical Provider, MD  Red Yeast Rice Extract (RED YEAST RICE PO) Take 1,200 mg by mouth daily.    Historical Provider, MD  Vitamin K, Phytonadione, 100 MCG TABS Take by mouth.    Historical Provider, MD     Depression screen [facility] 2/9 06/18/2015 01/04/2014 06/29/2013  Decreased Interest 0 0 0  Down, Depressed, Hopeless 0 0 0  PHQ - 2 Score 0 0 0     Fall Risk  06/18/2015 01/04/2014  Falls in the past year? No No  Risk for fall due to : - Medication side effect     Functional Status Survey: Is the patient deaf or have difficulty hearing?: No Does the patient have difficulty seeing, even when wearing glasses/contacts?: No Does the patient have difficulty concentrating, remembering, or making decisions?: No Does the patient have difficulty walking or climbing stairs?: No Does the patient have difficulty dressing or bathing?: No Does the patient have difficulty doing errands alone such as visiting a doctor's office or shopping?: No     Evaluation of Cognitive Function: Mood/affect: cheerful/bright Appearance: well groomed Family Member/caregiver input: none    PHYSICAL EXAM: BP (!) 144/90 (BP Location: Left Arm, Cuff Size:  Large)   Pulse 79   Temp 98.3 F (36.8 C) (Oral)   Resp 16   Ht 5' 1.5" (1.562 m)   Wt 221 lb 6.4 oz (100.4 kg)   SpO2 95%   BMI 41.16 kg/m    Wt Readings from Last 3 Encounters:  07/04/16 227 lb (103 kg)  06/18/15 222 lb 9.6 oz (101 kg)  04/24/14 225 lb (102.1 kg)       Visual Acuity Screening   Right eye Left eye Both eyes  Without correction: 20/20 20/25 20/20  With correction:  Physical Exam  Constitutional: She is oriented to person, place, and time. She appears well-developed and well-nourished. She is active and cooperative. No distress.  HENT:  Head: Normocephalic and atraumatic.  Right Ear: Hearing, tympanic membrane, external ear and ear canal normal.  Left Ear: Hearing, external ear and ear canal normal. No lacerations. No drainage, swelling or tenderness. No foreign bodies. No mastoid tenderness. Tympanic membrane is injected (mild). Tympanic membrane is not scarred, not perforated, not erythematous, not retracted and not bulging. Tympanic membrane mobility is normal.  No middle ear effusion. No hemotympanum. No decreased hearing is noted.  Nose: Nose normal.  Mouth/Throat: Uvula is midline, oropharynx is clear and moist and mucous membranes are normal. Oral lesions (gingival hyperplasia with ulceration anteriorly) present. Abnormal dentition. Dental caries present. No uvula swelling. No oropharyngeal exudate.    Eyes: Conjunctivae, EOM and lids are normal. Pupils are equal, round, and reactive to light. Right eye exhibits no discharge. Left eye exhibits no discharge. No scleral icterus.  Fundoscopic exam:      The right eye shows no hemorrhage and no papilledema. The right eye shows red reflex.       The left eye shows no hemorrhage and no papilledema. The left eye shows red reflex.  Neck: Normal range of motion, full passive range of motion without pain and phonation normal. Neck supple. No thyromegaly present.  Cardiovascular: Normal rate, regular  rhythm, normal heart sounds and intact distal pulses.  Exam reveals no gallop and no friction rub.   No murmur heard. Respiratory: Effort normal and breath sounds normal.  GI: Soft. Normal appearance and bowel sounds are normal. There is no hepatosplenomegaly. There is no tenderness.  Musculoskeletal:       Cervical back: Normal.       Thoracic back: Normal.       Lumbar back: Normal.  Lymphadenopathy:       Head (right side): No submandibular and no tonsillar adenopathy present.       Head (left side): No submandibular and no tonsillar adenopathy present.    She has no cervical adenopathy.       Right: No supraclavicular adenopathy present.       Left: No supraclavicular adenopathy present.  Neurological: She is alert and oriented to person, place, and time. She has normal strength. No cranial nerve deficit or sensory deficit.  Skin: Skin is warm and dry. No rash noted. She is not diaphoretic.  Psychiatric: She has a normal mood and affect. Her speech is normal and behavior is normal. Judgment and thought content normal. Cognition and memory are normal.    Education/Counseling: yes diet and exercise yes prevention of chronic diseases yes smoking/tobacco cessation yes review "Covered Medicare Preventive Services"    ASSESSMENT/PLAN:  1. Medicare annual wellness visit, subsequent Age appropriate anticipatory guidance provided.  2. Encounter for screening mammogram for breast cancer - MM Digital Screening; Future  3. Need for shingles vaccine - Zoster Vaccine Live, PF, (ZOSTAVAX) 69629 UNT/0.65ML injection; Inject 19,400 Units into the skin once.  Dispense: 0.65 mL; Refill: 0  4. Postmenopausal estrogen deficiency - DG Bone Density; Future  5. Essential hypertension - TSH - POCT urinalysis dipstick - Urine Microscopic  6. Type 2 diabetes mellitus with diabetic nephropathy, without long-term current use of insulin (HCC) - Microalbumin, urine  7. Hyperlipidemia with  target LDL less than 70  8. Poor oral hygiene - chlorhexidine gluconate, MEDLINE KIT, (PERIDEX) 0.12 % solution; Use as directed 15 mLs in the mouth  or throat 2 (two) times daily.  Dispense: 120 mL; Refill: 0 - Ambulatory referral to Oral Maxillofacial Surgery  9. Lesion of oral mucosa - Ambulatory referral to Oral Maxillofacial Surgery   Fara Chute, PA-C Physician Assistant-Certified [facility]

## 2016-09-01 NOTE — Progress Notes (Signed)
   Subjective:    [name], female    DOB: July 17, 1946, 70 y.o.   MRN: [id number]  HPI    Review of Systems  Constitutional: Negative.   HENT: Negative.   Eyes: Negative.   Respiratory: Negative.   Cardiovascular: Negative.   Gastrointestinal: Negative.   Endocrine: Negative.   Genitourinary: Negative.   Musculoskeletal: Negative.   Skin: Negative.   Allergic/Immunologic: Negative.   Neurological: Positive for numbness.  Hematological: Negative.   Psychiatric/Behavioral: Negative.        Objective:   Physical Exam        Assessment & Plan:

## 2016-09-01 NOTE — Patient Instructions (Signed)
You really need to see a dentist! Complete the amoxicillin, since you've started it, and if the ear pain or the mouth lesion continues, come back for re-evaluation.    IF you received an x-ray today, you will receive an invoice from [facility]. Please contact [facility] at [phone] with questions or concerns regarding your invoice.   IF you received labwork today, you will receive an invoice from [employer]. Please contact [facility] at [phone] with questions or concerns regarding your invoice.   Our billing staff will not be able to assist you with questions regarding bills from these companies.  You will be contacted with the lab results as soon as they are available. The fastest way to get your results is to activate your My Chart account. Instructions are located on the last page of this paperwork. If you have not heard from [place] regarding the results in 2 weeks, please contact this office.

## 2016-09-02 LAB — URINALYSIS, MICROSCOPIC ONLY
Bacteria, UA: NONE SEEN [HPF]
CASTS: NONE SEEN [LPF]
CRYSTALS: NONE SEEN [HPF]
YEAST: NONE SEEN [HPF]

## 2016-09-02 LAB — MICROALBUMIN, URINE: Microalb, Ur: 1.7 mg/dL

## 2016-09-12 ENCOUNTER — Ambulatory Visit (INDEPENDENT_AMBULATORY_CARE_PROVIDER_SITE_OTHER): Payer: Medicare Other

## 2016-09-12 ENCOUNTER — Ambulatory Visit (INDEPENDENT_AMBULATORY_CARE_PROVIDER_SITE_OTHER): Payer: Medicare Other | Admitting: Physician Assistant

## 2016-09-12 VITALS — BP 110/56 | HR 104 | Temp 101.7°F | Resp 18 | Ht 61.5 in | Wt 220.0 lb

## 2016-09-12 DIAGNOSIS — J189 Pneumonia, unspecified organism: Secondary | ICD-10-CM

## 2016-09-12 DIAGNOSIS — R509 Fever, unspecified: Secondary | ICD-10-CM

## 2016-09-12 DIAGNOSIS — D72829 Elevated white blood cell count, unspecified: Secondary | ICD-10-CM

## 2016-09-12 DIAGNOSIS — J029 Acute pharyngitis, unspecified: Secondary | ICD-10-CM

## 2016-09-12 LAB — POCT URINALYSIS DIP (MANUAL ENTRY)
Leukocytes, UA: NEGATIVE
Nitrite, UA: NEGATIVE
Protein Ur, POC: >=300 — AB
SPEC GRAV UA: 1.02
UROBILINOGEN UA: 1
pH, UA: 6.5

## 2016-09-12 LAB — POCT CBC
Granulocyte percent: 85.9 %G — AB (ref 37–80)
HCT, POC: 38.5 % (ref 37.7–47.9)
HEMOGLOBIN: 13.6 g/dL (ref 12.2–16.2)
LYMPH, POC: 2.3 (ref 0.6–3.4)
MCH, POC: 29.6 pg (ref 27–31.2)
MCHC: 35.4 g/dL (ref 31.8–35.4)
MCV: 83.7 fL (ref 80–97)
MID (cbc): 0.6 (ref 0–0.9)
MPV: 7.4 fL (ref 0–99.8)
POC Granulocyte: 17.9 — AB (ref 2–6.9)
POC LYMPH %: 11 % (ref 10–50)
POC MID %: 3.1 % (ref 0–12)
Platelet Count, POC: 305 10*3/uL (ref 142–424)
RBC: 4.59 M/uL (ref 4.04–5.48)
RDW, POC: 14.9 %
WBC: 20.8 10*3/uL — AB (ref 4.6–10.2)

## 2016-09-12 LAB — POC MICROSCOPIC URINALYSIS (UMFC)

## 2016-09-12 LAB — POCT INFLUENZA A/B
INFLUENZA A, POC: NEGATIVE
Influenza B, POC: NEGATIVE

## 2016-09-12 LAB — POCT RAPID STREP A (OFFICE): Rapid Strep A Screen: NEGATIVE

## 2016-09-12 MED ORDER — DOXYCYCLINE HYCLATE 100 MG PO CAPS: 100.0000 mg | ORAL_CAPSULE | Freq: Two times a day (BID) | ORAL | 1 refills | Status: DC

## 2016-09-12 MED ORDER — GUAIFENESIN ER 1200 MG PO TB12
1.0000 | ORAL_TABLET | Freq: Two times a day (BID) | ORAL | 1 refills | Status: DC | PRN
Start: 2016-09-12 — End: 2016-12-08

## 2016-09-12 MED ORDER — CEFTRIAXONE SODIUM 1 G IJ SOLR
1.0000 g | Freq: Once | INTRAMUSCULAR | Status: AC
  Administered 2016-09-12: 1 g via INTRAMUSCULAR

## 2016-09-12 MED ORDER — BENZONATATE 100 MG PO CAPS: 100.0000 mg | ORAL_CAPSULE | Freq: Three times a day (TID) | ORAL | 0 refills | Status: DC | PRN

## 2016-09-12 NOTE — Progress Notes (Signed)
Subjective:    [name], female    DOB: September 20, 1946, 70 y.o.   MRN: [id number]  Chief Complaint  Patient presents with  . Fever  . Generalized Body Aches  . Cough  . Fatigue   HPI: Presents for congestion and cough which began 2 days ago. States that evening on her drive home from [place] she was having drainage and congestion in her throat and upper airway which caused a coughing fit and she could not stop. Associated symptoms include weakness, fatigue, malaise, dizziness, subjective fever, chills, nausea, and vomiting. She states she had an episode of vomiting this morning after taking 2 extra strength tylenol and Ampicillin. She got the Ampicillin from "some [place] guy, not a doctor" and started taking that on the evening that the symptoms began. Medication has not helped improve her symptoms.  She has had worsening cough with associated sputum, congestion, and malaise. Denies chest pain, tightness, shortness of breath, sinus pain/pressure, sore throat, eye itching/redness/discharge.   She recently received her flu vaccine and Prevnar-13 on 07/04/2016.  States her blood sugar has been slightly elevated at home, with a reading of 274 mg/dL 2 days ago which went down to 175 mg/dL after lying down and taking medication. She states this was after she had eaten [place] food which was really sweet.  Review of Systems  Constitutional: Positive for appetite change, chills, fatigue and fever.  HENT: Positive for congestion and postnasal drip. Negative for ear discharge, ear pain, rhinorrhea, sinus pain, sinus pressure, sore throat and trouble swallowing.   Eyes: Negative for photophobia, pain, discharge, redness, itching and visual disturbance.  Respiratory: Positive for cough and wheezing. Negative for chest tightness and shortness of breath.   Cardiovascular: Negative for chest pain and palpitations.  Gastrointestinal: Positive for nausea and vomiting. Negative for abdominal  pain, blood in stool, constipation and diarrhea.  Genitourinary: Negative for decreased urine volume, difficulty urinating, dysuria, frequency, hematuria and urgency.  Musculoskeletal: Positive for myalgias.  Neurological: Positive for dizziness, light-headedness and headaches.   No Known Allergies  Prior to Admission medications   Medication Sig Start Date End Date Taking? Authorizing Provider  aspirin 81 MG tablet Take 81 mg by mouth daily.   Yes Historical Provider, MD  B Complex-C (B-COMPLEX WITH VITAMIN C) tablet Take 1 tablet by mouth daily.   Yes Historical Provider, MD  Calcium-Vitamin D-Vitamin K 383-338-32 MG-UNT-MCG CHEW Chew by mouth.   Yes Historical Provider, MD  chlorhexidine gluconate, MEDLINE KIT, (PERIDEX) 0.12 % solution Use as directed 15 mLs in the mouth or throat 2 (two) times daily. 09/01/16  Yes Chelle Jeffery, PA-C  Cinnamon 500 MG capsule Take 500 mg by mouth daily.   Yes Historical Provider, MD  GLUCOSAMINE-CHONDROITIN-VIT D3 PO Take by mouth.   Yes Historical Provider, MD  GuaiFENesin (MUCINEX PO) Take by mouth.   Yes Historical Provider, MD  hydrochlorothiazide (HYDRODIURIL) 25 MG tablet Take 1 tablet (25 mg total) by mouth daily with breakfast. 07/04/16  Yes Chelle Jeffery, PA-C  losartan (COZAAR) 100 MG tablet Take 1 tablet (100 mg total) by mouth daily. 07/04/16  Yes Chelle Jeffery, PA-C  magnesium oxide (MAG-OX) 400 MG tablet Take 400 mg by mouth daily.   Yes Historical Provider, MD  metFORMIN (GLUCOPHAGE) 500 MG tablet Take 1 tablet (500 mg total) by mouth 2 (two) times daily with a meal. 07/04/16  Yes Chelle Jeffery, PA-C  Multiple Vitamins-Minerals (CENTRUM SILVER ADULT 50+ PO) Take by mouth.   Yes Historical  Provider, MD  Multiple Vitamins-Minerals (MEGA BASIC PO) Take 500 Int'l Units by mouth daily.   Yes Historical Provider, MD  Ascorbic Acid (VITAMIN C) 1000 MG tablet Take 1,000 mg by mouth daily.    Historical Provider, MD  CALCIUM CITRATE-VITAMIN D3 PO Take  by mouth.    Historical Provider, MD  COD LIVER OIL PO Take by mouth.    Historical Provider, MD  Omega-3 Fatty Acids (FISH OIL PO) Take by mouth daily.    Historical Provider, MD  OVER THE COUNTER MEDICATION 400 mg daily.    Historical Provider, MD  Potassium Gluconate 595 MG CAPS Take by mouth.    Historical Provider, MD  Red Yeast Rice Extract (RED YEAST RICE PO) Take 1,200 mg by mouth daily.    Historical Provider, MD  Vitamin K, Phytonadione, 100 MCG TABS Take by mouth.    Historical Provider, MD   Patient Active Problem List   Diagnosis Date Noted  . Carious teeth 01/04/2014  . HTN (hypertension) 06/29/2013  . DM (diabetes mellitus), type 2 ([place]) 06/29/2013  . Hyperlipidemia with target LDL less than 70 06/29/2013  . Obesity       Objective: Blood pressure (!) 110/56, pulse (!) 104, temperature (!) 101.7 F (38.7 C), temperature source Oral, resp. rate 18, height 5' 1.5" (1.562 m), weight 220 lb (99.8 kg), SpO2 94 %.   Physical Exam  Constitutional: She is oriented to person, place, and time. She appears well-developed and well-nourished. She appears ill.  Lying down on exam table.  HENT:  Head: Normocephalic and atraumatic. Head is without right periorbital erythema and without left periorbital erythema.  Right Ear: No drainage, swelling or tenderness. Tympanic membrane is not injected, not scarred, not perforated, not erythematous, not retracted and not bulging.  Left Ear: No drainage, swelling or tenderness. Tympanic membrane is not injected, not scarred, not perforated, not erythematous, not retracted and not bulging.  Nose: Mucosal edema and rhinorrhea present. No sinus tenderness, septal deviation or nasal septal hematoma.  Mouth/Throat: Uvula is midline, oropharynx is clear and moist and mucous membranes are normal. Mucous membranes are not pale, not dry and not cyanotic. No oral lesions. Abnormal dentition. No uvula swelling. No oropharyngeal exudate, posterior oropharyngeal  edema, posterior oropharyngeal erythema or tonsillar abscesses.  Eyes: Conjunctivae are normal. Pupils are equal, round, and reactive to light. Right eye exhibits no discharge and no exudate. Left eye exhibits no discharge and no exudate. Right conjunctiva is not injected. Left conjunctiva is not injected. No scleral icterus. Right pupil is round and reactive. Left pupil is round and reactive. Pupils are equal.  Neck: Normal range of motion. Neck supple. No tracheal deviation present. No thyromegaly present.  Cardiovascular: Regular rhythm, S1 normal and S2 normal.  Tachycardia present.  Exam reveals no gallop, no distant heart sounds and no friction rub.   No murmur heard. Pulmonary/Chest: No accessory muscle usage or stridor. No respiratory distress. She has no decreased breath sounds. She has no wheezes. She has no rhonchi. She has no rales.  Upper airway congestion noted upon auscultation.  Abdominal: Soft. Normal appearance. Bowel sounds are increased. There is tenderness in the right upper quadrant and right lower quadrant. There is guarding. There is no rigidity, no rebound, no CVA tenderness, no tenderness at McBurney's point and negative Murphy's sign.  Lymphadenopathy:       Head (right side): No submental, no submandibular, no tonsillar, no preauricular and no posterior auricular adenopathy present.  Head (left side): No submental, no submandibular, no tonsillar, no preauricular and no posterior auricular adenopathy present.    She has no cervical adenopathy.  Neurological: She is alert and oriented to person, place, and time.  Psychiatric: She has a normal mood and affect. Her speech is normal and behavior is normal.    Results for orders placed or performed in visit on 09/12/16  POCT Influenza A/B  Result Value Ref Range   Influenza A, POC Negative Negative   Influenza B, POC Negative Negative  POCT CBC  Result Value Ref Range   WBC 20.8 (A) 4.6 - 10.2 K/uL   Lymph, poc 2.3  0.6 - 3.4   POC LYMPH PERCENT 11.0 10 - 50 %L   MID (cbc) 0.6 0 - 0.9   POC MID % 3.1 0 - 12 %M   POC Granulocyte 17.9 (A) 2 - 6.9   Granulocyte percent 85.9 (A) 37 - 80 %G   RBC 4.59 4.04 - 5.48 M/uL   Hemoglobin 13.6 12.2 - 16.2 g/dL   HCT, POC 38.5 37.7 - 47.9 %   MCV 83.7 80 - 97 fL   MCH, POC 29.6 27 - 31.2 pg   MCHC 35.4 31.8 - 35.4 g/dL   RDW, POC 14.9 %   Platelet Count, POC 305 142 - 424 K/uL   MPV 7.4 0 - 99.8 fL  POCT rapid strep A  Result Value Ref Range   Rapid Strep A Screen Negative Negative  POCT urinalysis dipstick  Result Value Ref Range   Color, UA yellow yellow   Clarity, UA clear clear   Glucose, UA =100 (A) negative   Bilirubin, UA small (A) negative   Ketones, POC UA small (15) (A) negative   Spec Grav, UA 1.020    Blood, UA trace-lysed (A) negative   pH, UA 6.5    Protein Ur, POC >=300 (A) negative   Urobilinogen, UA 1.0    Nitrite, UA Negative Negative   Leukocytes, UA Negative Negative  POCT Microscopic Urinalysis ([facility])  Result Value Ref Range   WBC,UR,HPF,POC Few (A) None WBC/hpf   RBC,UR,HPF,POC Few (A) None RBC/hpf   Bacteria Few (A) None, Too numerous to count   Mucus Present (A) Absent   Epithelial Cells, UR Per Microscopy Few (A) None, Too numerous to count cells/hpf   Dg Chest 2 View  Result Date: 09/12/2016 CLINICAL DATA:  70 year old female with fever, leukocytosis and body aches. EXAM: CHEST  2 VIEW COMPARISON:  None. FINDINGS: Patchy airspace disease within the upper and lower right lung likely represent pneumonia. No pleural effusion, pneumothorax or pulmonary edema identified. No acute bony abnormalities are noted. IMPRESSION: Probable upper and lower right lung pneumonia. Radiographic follow-up to resolution recommended. Electronically Signed   By: Margarette Canada M.D.   On: 09/12/2016 13:41      Assessment & Plan:  1. Fever, unspecified fever cause Rapid flu test negative. CBC with increased WBC of 20.9 in clinic today. CXR with  probable RUL and RLL pneumonia. UA with glucose, protein, ketones, some blood, and mucus. Urine culture pending, will update patient upon review of results. Rx for pneumonia, see below. - POCT Influenza A/B - POCT CBC - DG Chest 2 View; Future - POCT urinalysis dipstick - POCT Microscopic Urinalysis ([facility]) - Urine culture  2. Sore throat Rapid strep negative in clinic today, will update patient with results of throat culture. - POCT rapid strep A - Culture, Group A Strep  3. Leukocytosis, unspecified  type CBC with WBC of 20.8 in clinic today. Rocephin injection given and advised to RTC on Monday for re-evaluation of WBC. - cefTRIAXone (ROCEPHIN) injection 1 g; Inject 1 g into the muscle once.  4. Community acquired pneumonia of right lung, unspecified part of lung CXR with probable RUL and RLL pneumonia and CBC with WBC of 20.8. Rocephin injection given in clinic today. Doxycycline prescription provided. Advised patient to RTC on Monday for re-evaluation of WBC and will re-evaluate CXR in 4-6 weeks for resolution of opacity. Advised to discontinue Ampicillin. Mucinex and Tessalon perles prescriptions provided for symptomatic relief. - benzonatate (TESSALON) 100 MG capsule; Take 1-2 capsules (100-200 mg total) by mouth 3 (three) times daily as needed for cough.  Dispense: 40 capsule; Refill: 0 - Guaifenesin (MUCINEX MAXIMUM STRENGTH) 1200 MG TB12; Take 1 tablet (1,200 mg total) by mouth every 12 (twelve) hours as needed.  Dispense: 14 tablet; Refill: 1 - doxycycline (VIBRAMYCIN) 100 MG capsule; Take 1 capsule (100 mg total) by mouth 2 (two) times daily.  Dispense: 20 capsule; Refill: 1

## 2016-09-12 NOTE — Patient Instructions (Addendum)
     IF you received an x-ray today, you will receive an invoice from [facility]. Please contact [facility] at [phone] with questions or concerns regarding your invoice.   IF you received labwork today, you will receive an invoice from [employer]. Please contact [facility] at [phone] with questions or concerns regarding your invoice.   Our billing staff will not be able to assist you with questions regarding bills from these companies.  You will be contacted with the lab results as soon as they are available. The fastest way to get your results is to activate your My Chart account. Instructions are located on the last page of this paperwork. If you have not heard from [place] regarding the results in 2 weeks, please contact this office.      Community-Acquired Pneumonia, Adult Introduction Pneumonia is an infection of the lungs. One type of pneumonia can happen while a person is in a hospital. A different type can happen when a person is not in a hospital (community-acquired pneumonia). It is easy for this kind to spread from person to person. It can spread to you if you breathe near an infected person who coughs or sneezes. Some symptoms include:  A dry cough.  A wet (productive) cough.  Fever.  Sweating.  Chest pain. Follow these instructions at home:  Take over-the-counter and prescription medicines only as told by your doctor.  Only take cough medicine if you are losing sleep.  If you were prescribed an antibiotic medicine, take it as told by your doctor. Do not stop taking the antibiotic even if you start to feel better.  Sleep with your head and neck raised (elevated). You can do this by putting a few pillows under your head, or you can sleep in a recliner.  Do not use tobacco products. These include cigarettes, chewing tobacco, and e-cigarettes. If you need help quitting, ask your doctor.  Drink enough water to keep your  pee (urine) clear or pale yellow. A shot (vaccine) can help prevent pneumonia. Shots are often suggested for:  People older than 68 years of age.  People older than 68 years of age:  Who are having cancer treatment.  Who have long-term (chronic) lung disease.  Who have problems with their body's defense system (immune system). You may also prevent pneumonia if you take these actions:  Get the flu (influenza) shot every year.  Go to the dentist as often as told.  Wash your hands often. If soap and water are not available, use hand sanitizer. Contact a doctor if:  You have a fever.  You lose sleep because your cough medicine does not help. Get help right away if:  You are short of breath and it gets worse.  You have more chest pain.  Your sickness gets worse. This is very serious if:  You are an older adult.  Your body's defense system is weak.  You cough up blood. This information is not intended to replace advice given to you by your health care provider. Make sure you discuss any questions you have with your health care provider. Document Released: 03/30/2008 Document Revised: 03/19/2016 Document Reviewed: 02/06/2015  2017 Elsevier

## 2016-09-12 NOTE — Progress Notes (Signed)
[name], female    DOB: [birth year], 70 y.o.   MRN: [id number]  PCP: Harrison Mons, PA-C  Chief Complaint  Patient presents with  . Fever  . Generalized Body Aches  . Cough  . Fatigue    Subjective:   Presents for evaluation of illness x 2 days.  While driving home from [facility] yesterday she noted significant drainage and congestion in her throat, and started coughing. She then developed fatigue, subjective weakness, subjective fever/chills, nausea. Vomited after taking 2 Extra Strength Tylenol and a dose of Ampicillin (she purchased this from "some [place] guy," even though she was recently advised against purchasing antibiotics from lay people and taking them for illnesses not evaluated by health care providers). This home treatment has not helped.  "I feel like I've been run over by a truck. I feel like I could just die." No known sick contacts. No CP, SOB, chest tightness, palpitations. No facial/sinus pressure/pain. No sore throat. No eye symptoms. No headache.  Received the seasonal flu vaccine on 07/04/2016, along with Prevnar-13. She will need the booster Pneumovax-23 in 09/2018.  Review of Systems Constitutional: Positive for appetite change, chills, fatigue and fever.  HENT: Positive for congestion and postnasal drip. Negative for ear discharge, ear pain, rhinorrhea, sinus pain, sinus pressure, sore throat and trouble swallowing.   Eyes: Negative for photophobia, pain, discharge, redness, itching and visual disturbance.  Respiratory: Positive for cough and wheezing. Negative for chest tightness and shortness of breath.   Cardiovascular: Negative for chest pain and palpitations.  Gastrointestinal: Positive for nausea and vomiting. Negative for abdominal pain, blood in stool, constipation and diarrhea.  Genitourinary: Negative for decreased urine volume, difficulty urinating, dysuria, frequency, hematuria and urgency.  Musculoskeletal: Positive for  myalgias.  Neurological: Positive for dizziness, light-headedness and headaches.     Patient Active Problem List   Diagnosis Date Noted  . Carious teeth 01/04/2014  . HTN (hypertension) 06/29/2013  . DM (diabetes mellitus), type 2 ([place]) 06/29/2013  . Hyperlipidemia with target LDL less than 70 06/29/2013  . Obesity      Prior to Admission medications   Medication Sig Start Date End Date Taking? Authorizing Provider  aspirin 81 MG tablet Take 81 mg by mouth daily.   Yes Historical Provider, MD  B Complex-C (B-COMPLEX WITH VITAMIN C) tablet Take 1 tablet by mouth daily.   Yes Historical Provider, MD  Calcium-Vitamin D-Vitamin K [phone] MG-UNT-MCG CHEW Chew by mouth.   Yes Historical Provider, MD  chlorhexidine gluconate, MEDLINE KIT, (PERIDEX) 0.12 % solution Use as directed 15 mLs in the mouth or throat 2 (two) times daily. 09/01/16  Yes Tomicka Lover, PA-C  Cinnamon 500 MG capsule Take 500 mg by mouth daily.   Yes Historical Provider, MD  GLUCOSAMINE-CHONDROITIN-VIT D3 PO Take by mouth.   Yes Historical Provider, MD  GuaiFENesin (MUCINEX PO) Take by mouth.   Yes Historical Provider, MD  hydrochlorothiazide (HYDRODIURIL) 25 MG tablet Take 1 tablet (25 mg total) by mouth daily with breakfast. 07/04/16  Yes Khaidyn Staebell, PA-C  losartan (COZAAR) 100 MG tablet Take 1 tablet (100 mg total) by mouth daily. 07/04/16  Yes Lavaeh Bau, PA-C  magnesium oxide (MAG-OX) 400 MG tablet Take 400 mg by mouth daily.   Yes Historical Provider, MD  metFORMIN (GLUCOPHAGE) 500 MG tablet Take 1 tablet (500 mg total) by mouth 2 (two) times daily with a meal. 07/04/16  Yes Cassy Sprowl, PA-C  Multiple Vitamins-Minerals (CENTRUM SILVER ADULT 50+ PO) Take by  mouth.   Yes Historical Provider, MD  Multiple Vitamins-Minerals (MEGA BASIC PO) Take 500 Int'l Units by mouth daily.   Yes Historical Provider, MD  Ascorbic Acid (VITAMIN C) 1000 MG tablet Take 1,000 mg by mouth daily.    Historical Provider, MD  CALCIUM  CITRATE-VITAMIN D3 PO Take by mouth.    Historical Provider, MD  COD LIVER OIL PO Take by mouth.    Historical Provider, MD  Omega-3 Fatty Acids (FISH OIL PO) Take by mouth daily.    Historical Provider, MD  OVER THE COUNTER MEDICATION 400 mg daily.    Historical Provider, MD  Potassium Gluconate 595 MG CAPS Take by mouth.    Historical Provider, MD  Red Yeast Rice Extract (RED YEAST RICE PO) Take 1,200 mg by mouth daily.    Historical Provider, MD  Vitamin K, Phytonadione, 100 MCG TABS Take by mouth.    Historical Provider, MD     No Known Allergies     Objective:  Physical Exam  Constitutional: She is oriented to person, place, and time. She appears well-developed and well-nourished. She appears lethargic. She is sleeping and cooperative. She is easily aroused. She appears ill.  BP (!) 110/56   Pulse (!) 104   Temp (!) 101.7 F (38.7 C) (Oral)   Resp 18   Ht 5' 1.5" (1.562 m)   Wt 220 lb (99.8 kg)   SpO2 94%   BMI 40.90 kg/m   HENT:  Head: Normocephalic and atraumatic.  Right Ear: Hearing normal.  Left Ear: Hearing normal.  Eyes: Conjunctivae are normal. No scleral icterus.  Neck: Normal range of motion. Neck supple. No thyromegaly present.  Cardiovascular: Normal rate, regular rhythm and normal heart sounds.   Pulses:      Radial pulses are 2+ on the right side, and 2+ on the left side.  Pulmonary/Chest: Effort normal and breath sounds normal.  Lymphadenopathy:       Head (right side): No tonsillar, no preauricular, no posterior auricular and no occipital adenopathy present.       Head (left side): No tonsillar, no preauricular, no posterior auricular and no occipital adenopathy present.    She has no cervical adenopathy.       Right: No supraclavicular adenopathy present.       Left: No supraclavicular adenopathy present.  Neurological: She is oriented to person, place, and time and easily aroused. She appears lethargic. No sensory deficit.  Skin: Skin is warm, dry and  intact. No rash noted. No cyanosis or erythema. Nails show no clubbing.  Psychiatric: She has a normal mood and affect. Her speech is normal and behavior is normal.       Results for orders placed or performed in visit on 09/12/16  POCT Influenza A/B  Result Value Ref Range   Influenza A, POC Negative Negative   Influenza B, POC Negative Negative  POCT CBC  Result Value Ref Range   WBC 20.8 (A) 4.6 - 10.2 K/uL   Lymph, poc 2.3 0.6 - 3.4   POC LYMPH PERCENT 11.0 10 - 50 %L   MID (cbc) 0.6 0 - 0.9   POC MID % 3.1 0 - 12 %M   POC Granulocyte 17.9 (A) 2 - 6.9   Granulocyte percent 85.9 (A) 37 - 80 %G   RBC 4.59 4.04 - 5.48 M/uL   Hemoglobin 13.6 12.2 - 16.2 g/dL   HCT, POC 38.5 37.7 - 47.9 %   MCV 83.7 80 - 97 fL  MCH, POC 29.6 27 - 31.2 pg   MCHC 35.4 31.8 - 35.4 g/dL   RDW, POC 14.9 %   Platelet Count, POC 305 142 - 424 K/uL   MPV 7.4 0 - 99.8 fL  POCT rapid strep A  Result Value Ref Range   Rapid Strep A Screen Negative Negative  POCT urinalysis dipstick  Result Value Ref Range   Color, UA yellow yellow   Clarity, UA clear clear   Glucose, UA =100 (A) negative   Bilirubin, UA small (A) negative   Ketones, POC UA small (15) (A) negative   Spec Grav, UA 1.020    Blood, UA trace-lysed (A) negative   pH, UA 6.5    Protein Ur, POC >=300 (A) negative   Urobilinogen, UA 1.0    Nitrite, UA Negative Negative   Leukocytes, UA Negative Negative  POCT Microscopic Urinalysis ([facility])  Result Value Ref Range   WBC,UR,HPF,POC Few (A) None WBC/hpf   RBC,UR,HPF,POC Few (A) None RBC/hpf   Bacteria Few (A) None, Too numerous to count   Mucus Present (A) Absent   Epithelial Cells, UR Per Microscopy Few (A) None, Too numerous to count cells/hpf    Dg Chest 2 View  Result Date: 09/12/2016 CLINICAL DATA:  70 year old female with fever, leukocytosis and body aches. EXAM: CHEST  2 VIEW COMPARISON:  None. FINDINGS: Patchy airspace disease within the upper and lower right lung likely  represent pneumonia. No pleural effusion, pneumothorax or pulmonary edema identified. No acute bony abnormalities are noted. IMPRESSION: Probable upper and lower right lung pneumonia. Radiographic follow-up to resolution recommended. Electronically Signed   By: Margarette Canada M.D.   On: 09/12/2016 13:41        Assessment & Plan:   1. Fever, unspecified fever cause Influenza still possible. Treat supporitively. Await remaining labs. - POCT Influenza A/B - POCT CBC - DG Chest 2 View; Future - POCT urinalysis dipstick - POCT Microscopic Urinalysis ([facility]) - Urine culture  2. Sore throat Supportive care.  - POCT rapid strep A - Culture, Group A Strep  3. Leukocytosis, unspecified type Due to CAP. - cefTRIAXone (ROCEPHIN) injection 1 g; Inject 1 g into the muscle once.  4. Community acquired pneumonia of right lung, unspecified part of lung Empiric treatment with doxycycline. RTC Monday 11/20 for re-evaluation with planned repeat CBC. - benzonatate (TESSALON) 100 MG capsule; Take 1-2 capsules (100-200 mg total) by mouth 3 (three) times daily as needed for cough.  Dispense: 40 capsule; Refill: 0 - Guaifenesin (MUCINEX MAXIMUM STRENGTH) 1200 MG TB12; Take 1 tablet (1,200 mg total) by mouth every 12 (twelve) hours as needed.  Dispense: 14 tablet; Refill: 1 - doxycycline (VIBRAMYCIN) 100 MG capsule; Take 1 capsule (100 mg total) by mouth 2 (two) times daily.  Dispense: 20 capsule; Refill: 1   Fara Chute, PA-C Physician Assistant-Certified [facility]

## 2016-09-13 LAB — URINE CULTURE

## 2016-09-13 LAB — CULTURE, GROUP A STREP: Organism ID, Bacteria: NORMAL

## 2016-09-14 ENCOUNTER — Telehealth: Payer: Self-pay

## 2016-09-14 ENCOUNTER — Ambulatory Visit (INDEPENDENT_AMBULATORY_CARE_PROVIDER_SITE_OTHER): Payer: Medicare Other | Admitting: Family Medicine

## 2016-09-14 VITALS — BP 160/80 | HR 84 | Temp 99.4°F | Resp 16 | Ht 61.5 in | Wt 218.8 lb

## 2016-09-14 DIAGNOSIS — I10 Essential (primary) hypertension: Secondary | ICD-10-CM | POA: Diagnosis not present

## 2016-09-14 DIAGNOSIS — M549 Dorsalgia, unspecified: Secondary | ICD-10-CM | POA: Diagnosis not present

## 2016-09-14 DIAGNOSIS — Z5181 Encounter for therapeutic drug level monitoring: Secondary | ICD-10-CM | POA: Diagnosis not present

## 2016-09-14 DIAGNOSIS — J189 Pneumonia, unspecified organism: Secondary | ICD-10-CM | POA: Diagnosis not present

## 2016-09-14 MED ORDER — LOSARTAN POTASSIUM 100 MG PO TABS: 100.0000 mg | ORAL_TABLET | Freq: Every day | ORAL | 3 refills | Status: DC

## 2016-09-14 MED ORDER — HYDROCHLOROTHIAZIDE 25 MG PO TABS: 25.0000 mg | ORAL_TABLET | Freq: Every day | ORAL | 3 refills | Status: DC

## 2016-09-14 NOTE — Telephone Encounter (Signed)
OV notes for oral surgery referral are not complete. OV was 11/7. Thank you!

## 2016-09-14 NOTE — Progress Notes (Signed)
Chief Complaint  Patient presents with  . Follow-up    from 09/12/2016, pt states she is still wheezing,( need Losartan, HCTZ 25 mg refilled)    HPI  Hypertension: Patient here for follow-up of elevated blood pressure. She states that she ran out of her medications and has not had any meds in the past 2 days.  She normally has normal blood pressures when she takes her meds.  She has not taken her blood pressure meds because she ran out completely. She has been drinking water and canned soup. BP Readings from Last 3 Encounters:  09/14/16 (!) 160/80  09/12/16 (!) 110/56  09/01/16 (!) 144/90     Pneumonia She reports that she has been having chest congestion, wheezing, shortness of breath. She is on the doxycycline and mucinex and tessalon She reports that she has not had any more fevers Her cough is nonproductive She reports poor appetite She is taking her doxycycline as instructed and feels less hot and sweaty and has not had shaking chills.   Upper back pain Pt reports that she has developed upper back pain due to her coughing and fatigue She reports that she just feels run down She states that she always has some kind of back pain She reports that she has normal strength   Past Medical History:  Diagnosis Date  . Ankle fracture   . Diabetes mellitus without complication ([place])   . Hypertension   . Obesity     Current Outpatient Prescriptions  Medication Sig Dispense Refill  . Ascorbic Acid (VITAMIN C) 1000 MG tablet Take 1,000 mg by mouth daily.    Marland Kitchen aspirin 81 MG tablet Take 81 mg by mouth daily.    . B Complex-C (B-COMPLEX WITH VITAMIN C) tablet Take 1 tablet by mouth daily.    . benzonatate (TESSALON) 100 MG capsule Take 1-2 capsules (100-200 mg total) by mouth 3 (three) times daily as needed for cough. 40 capsule 0  . CALCIUM CITRATE-VITAMIN D3 PO Take by mouth.    . Calcium-Vitamin D-Vitamin K [phone] MG-UNT-MCG CHEW Chew by mouth.    . chlorhexidine  gluconate, MEDLINE KIT, (PERIDEX) 0.12 % solution Use as directed 15 mLs in the mouth or throat 2 (two) times daily. 120 mL 0  . Cinnamon 500 MG capsule Take 500 mg by mouth daily.    . COD LIVER OIL PO Take by mouth.    . doxycycline (VIBRAMYCIN) 100 MG capsule Take 1 capsule (100 mg total) by mouth 2 (two) times daily. 20 capsule 1  . GLUCOSAMINE-CHONDROITIN-VIT D3 PO Take by mouth.    . Guaifenesin (MUCINEX MAXIMUM STRENGTH) 1200 MG TB12 Take 1 tablet (1,200 mg total) by mouth every 12 (twelve) hours as needed. 14 tablet 1  . hydrochlorothiazide (HYDRODIURIL) 25 MG tablet Take 1 tablet (25 mg total) by mouth daily with breakfast. 90 tablet 3  . losartan (COZAAR) 100 MG tablet Take 1 tablet (100 mg total) by mouth daily. 90 tablet 3  . magnesium oxide (MAG-OX) 400 MG tablet Take 400 mg by mouth daily.    . metFORMIN (GLUCOPHAGE) 500 MG tablet Take 1 tablet (500 mg total) by mouth 2 (two) times daily with a meal. 180 tablet 3  . Multiple Vitamins-Minerals (CENTRUM SILVER ADULT 50+ PO) Take by mouth.    . Multiple Vitamins-Minerals (MEGA BASIC PO) Take 500 Int'l Units by mouth daily.    . Omega-3 Fatty Acids (FISH OIL PO) Take by mouth daily.    Marland Kitchen OVER THE  COUNTER MEDICATION 400 mg daily.    . Potassium Gluconate 595 MG CAPS Take by mouth.    . Red Yeast Rice Extract (RED YEAST RICE PO) Take 1,200 mg by mouth daily.    . Vitamin K, Phytonadione, 100 MCG TABS Take by mouth.     No current facility-administered medications for this visit.     Allergies: No Known Allergies  Past Surgical History:  Procedure Laterality Date  . APPENDECTOMY  1955   childhood  . colon repair      Social History   Social History  . Marital status: Married    Spouse name: Malessa Zartman, Sr.  . Number of children: 2  . Years of education: 12+   Occupational History  . realtor   . car dealer    Social History Main Topics  . Smoking status: Never Smoker  . Smokeless tobacco: Never Used  . Alcohol  use No  . Drug use: No  . Sexual activity: Not Currently   Other Topics Concern  . None   Social History Narrative   Lives with her husband.  Her adult children live locally.      College education, works as a Forensic psychologist.    ROS  Objective: Vitals:   09/14/16 1401  BP: (!) 160/80  Pulse: 84  Resp: 16  Temp: 99.4 F (37.4 C)  TempSrc: Oral  SpO2: 92%  Weight: 218 lb 12.8 oz (99.2 kg)  Height: 5' 1.5" (1.562 m)    Physical Exam  Constitutional: She appears well-developed and well-nourished.  HENT:  Head: Normocephalic and atraumatic.  Eyes: Conjunctivae and EOM are normal.  Cardiovascular: Normal rate, regular rhythm and normal heart sounds.   Pulmonary/Chest: Effort normal and breath sounds normal. No respiratory distress. She has no wheezes. She has no rales. She exhibits no tenderness.  Musculoskeletal:       Cervical back: She exhibits spasm. She exhibits normal range of motion, no tenderness, no bony tenderness and no swelling.    Assessment and Plan Calli was seen today for follow-up.  Diagnoses and all orders for this visit:  Essential hypertension- currently elevated since pt ran out of meds  Advised pt to resume meds immediately To try lower sodium canned soup 1 week follow up for bp -     losartan (COZAAR) 100 MG tablet; Take 1 tablet (100 mg total) by mouth daily. -     hydrochlorothiazide (HYDRODIURIL) 25 MG tablet; Take 1 tablet (25 mg total) by mouth daily with breakfast.  Community acquired pneumonia of right lung, unspecified part of lung- continue doxyxycline, mucinex and tessalon  Encounter for medication monitoring- reviewed Cre and electrolytes Refilled bp meds today  Upper back pain- due to her coughing and muscle aches advised tylenol prn Continue hydration Heat pad prn     Lenzie Sandler A Saburo Luger

## 2016-09-14 NOTE — Patient Instructions (Addendum)
IF you received an x-ray today, you will receive an invoice from [facility]. Please contact [facility] at [phone] with questions or concerns regarding your invoice.   IF you received labwork today, you will receive an invoice from [employer]. Please contact [facility] at [phone] with questions or concerns regarding your invoice.   Our billing staff will not be able to assist you with questions regarding bills from these companies.  You will be contacted with the lab results as soon as they are available. The fastest way to get your results is to activate your My Chart account. Instructions are located on the last page of this paperwork. If you have not heard from [place] regarding the results in 2 weeks, please contact this office.     Hypertension Hypertension, commonly called high blood pressure, is when the force of blood pumping through your arteries is too strong. Your arteries are the blood vessels that carry blood from your heart throughout your body. A blood pressure reading consists of a higher number over a lower number, such as 110/72. The higher number (systolic) is the pressure inside your arteries when your heart pumps. The lower number (diastolic) is the pressure inside your arteries when your heart relaxes. Ideally you want your blood pressure below 120/80. Hypertension forces your heart to work harder to pump blood. Your arteries may become narrow or stiff. Having untreated or uncontrolled hypertension can cause heart attack, stroke, kidney disease, and other problems. What increases the risk? Some risk factors for high blood pressure are controllable. Others are not. Risk factors you cannot control include:  Race. You may be at higher risk if you are African American.  Age. Risk increases with age.  Gender. Men are at higher risk than women before age 34 years. After age 84, women are at higher risk than men. Risk  factors you can control include:  Not getting enough exercise or physical activity.  Being overweight.  Getting too much fat, sugar, calories, or salt in your diet.  Drinking too much alcohol. What are the signs or symptoms? Hypertension does not usually cause signs or symptoms. Extremely high blood pressure (hypertensive crisis) may cause headache, anxiety, shortness of breath, and nosebleed. How is this diagnosed? To check if you have hypertension, your health care provider will measure your blood pressure while you are seated, with your arm held at the level of your heart. It should be measured at least twice using the same arm. Certain conditions can cause a difference in blood pressure between your right and left arms. A blood pressure reading that is higher than normal on one occasion does not mean that you need treatment. If it is not clear whether you have high blood pressure, you may be asked to return on a different day to have your blood pressure checked again. Or, you may be asked to monitor your blood pressure at home for 1 or more weeks. How is this treated? Treating high blood pressure includes making lifestyle changes and possibly taking medicine. Living a healthy lifestyle can help lower high blood pressure. You may need to change some of your habits. Lifestyle changes may include:  Following the DASH diet. This diet is high in fruits, vegetables, and whole grains. It is low in salt, red meat, and added sugars.  Keep your sodium intake below 2,300 mg per day.  Getting at least 30-45 minutes of aerobic exercise at least 4 times per week.  Losing weight  if necessary.  Not smoking.  Limiting alcoholic beverages.  Learning ways to reduce stress. Your health care provider may prescribe medicine if lifestyle changes are not enough to get your blood pressure under control, and if one of the following is true:  You are 17-67 years of age and your systolic blood pressure is  above 140.  You are 43 years of age or older, and your systolic blood pressure is above 150.  Your diastolic blood pressure is above 90.  You have diabetes, and your systolic blood pressure is over [id number] or your diastolic blood pressure is over 90.  You have kidney disease and your blood pressure is above 140/90.  You have heart disease and your blood pressure is above 140/90. Your personal target blood pressure may vary depending on your medical conditions, your age, and other factors. Follow these instructions at home:  Have your blood pressure rechecked as directed by your health care provider.  Take medicines only as directed by your health care provider. Follow the directions carefully. Blood pressure medicines must be taken as prescribed. The medicine does not work as well when you skip doses. Skipping doses also puts you at risk for problems.  Do not smoke.  Monitor your blood pressure at home as directed by your health care provider. Contact a health care provider if:  You think you are having a reaction to medicines taken.  You have recurrent headaches or feel dizzy.  You have swelling in your ankles.  You have trouble with your vision. Get help right away if:  You develop a severe headache or confusion.  You have unusual weakness, numbness, or feel faint.  You have severe chest or abdominal pain.  You vomit repeatedly.  You have trouble breathing. This information is not intended to replace advice given to you by your health care provider. Make sure you discuss any questions you have with your health care provider. Document Released: 10/12/2005 Document Revised: 03/19/2016 Document Reviewed: 08/04/2013 Elsevier Interactive Patient Education  2017 [employer].

## 2016-09-21 ENCOUNTER — Ambulatory Visit (INDEPENDENT_AMBULATORY_CARE_PROVIDER_SITE_OTHER): Payer: Medicare Other | Admitting: Physician Assistant

## 2016-09-21 VITALS — BP 138/90 | HR 85 | Temp 97.8°F | Resp 17 | Ht 61.5 in | Wt 219.0 lb

## 2016-09-21 DIAGNOSIS — I10 Essential (primary) hypertension: Secondary | ICD-10-CM

## 2016-09-21 DIAGNOSIS — J189 Pneumonia, unspecified organism: Secondary | ICD-10-CM

## 2016-09-21 DIAGNOSIS — R202 Paresthesia of skin: Secondary | ICD-10-CM

## 2016-09-21 NOTE — Patient Instructions (Addendum)
Please cancel the visit tomorrow with the oral surgeon. Make an appointment with a dentists as soon as you are able.  Check your blood pressure daily (vary the times) and record the readings. Bring the log and your cuff with you in 3 weeks.    IF you received an x-ray today, you will receive an invoice from [facility]. Please contact [facility] at [phone] with questions or concerns regarding your invoice.   IF you received labwork today, you will receive an invoice from [employer]. Please contact [facility] at [phone] with questions or concerns regarding your invoice.   Our billing staff will not be able to assist you with questions regarding bills from these companies.  You will be contacted with the lab results as soon as they are available. The fastest way to get your results is to activate your My Chart account. Instructions are located on the last page of this paperwork. If you have not heard from [place] regarding the results in 2 weeks, please contact this office.   We recommend that you schedule a mammogram for breast cancer screening. Typically, you do not need a referral to do this. Please contact a local imaging center to schedule your mammogram.  [facility] - [phone]  *ask for the Radiology Department [facility] ([place]) - [phone] or [phone]  [facility] - [phone] [place] [phone] [facility] - [phone]  *ask for the [facility] - [phone]  *ask for the Radiology Department [facility] - [phone]  *ask for the [facility] - [phone]

## 2016-09-21 NOTE — Progress Notes (Signed)
[name], female    DOB: [birth year], 70 y.o.   MRN: [id number]  PCP: Harrison Mons, PA-C  Chief Complaint  Patient presents with  . Follow-up    PT UNSURE STATES "THEY JUST CALLED ME"    Subjective:   Presents for evaluation of recent diagnosis of CAP, elevated BP and gum lesion.  She was seen 11/18 with CAP and elevated WBC. She was advised to RTC on 11/20, with plans to repeat the CBC. She was significantly improved, however, and repeat lab not deemed needed at that point. BP was significantly elevated at that visit, and she reported being out of her medication x 2 days. It was refilled and she was advised to RTC today for recheck of the BP.  She reports home BP 159-179/80-90. Review of blood pressure measurements from recent visits include:  06/18/2015: 121/82 mmHg 07/04/2016: 122/78 mmHg 09/01/16: 150/80 mmHg then 144/90 mmHg 09/12/16: 110/56 mmHg 09/14/2016: 160/80 mmHg  Occasional headache that she describes as "tension." No CP, SOB, HA, dizziness. No visual changes.  Cough persists, but is much improved. She remains fatigued, but overall, feels so much better.  Today reports persistent numbness/tingling in the feet, and feeling a "little wobbly on my feet." Home glucose 90-120. A1C 7.0% 07/04/2016. Notes occasional popping/clicking in the low back and wonders if that's related. Has tried gabapentin, without benefit and caused somnolence even at low dose.   Lumbar spine films 04/24/2014 showed Very minimal anterolisthesis of L4 on L5 appears degenerative in origin. Intervertebral disc spaces are unremarkable. No LE weakness, saddle anesthesia, radicular pain.  She has an appointment with the oral surgeon tomorrow to assess the concerning lesion on the gums. She says that when they called to schedule, they told her that they probably were not the right specialist, and that the probably needed a dentist or periodontist. She states that the gums are much better  now.   Review of Systems Constitutional: Positive for fatigue. Negative for chills and fever.  HENT: Negative for congestion, ear discharge, ear pain, postnasal drip, rhinorrhea, sinus pain, sinus pressure, sore throat and tinnitus.   Eyes: Negative for photophobia, pain, discharge, itching and visual disturbance.  Respiratory: Positive for cough and wheezing. Negative for choking, chest tightness and shortness of breath.   Cardiovascular: Negative for chest pain, palpitations and leg swelling.  Gastrointestinal: Negative for abdominal pain, blood in stool, constipation, diarrhea, nausea and vomiting.  Genitourinary: Negative for difficulty urinating, dysuria, frequency, hematuria and urgency.  Neurological: Positive for numbness and headaches. Negative for dizziness, weakness and light-headedness.     Patient Active Problem List   Diagnosis Date Noted  . Carious teeth 01/04/2014  . HTN (hypertension) 06/29/2013  . DM (diabetes mellitus), type 2 ([place]) 06/29/2013  . Hyperlipidemia with target LDL less than 70 06/29/2013  . Obesity      Prior to Admission medications   Medication Sig Start Date End Date Taking? Authorizing Provider  Ascorbic Acid (VITAMIN C) 1000 MG tablet Take 1,000 mg by mouth daily.   Yes Historical Provider, MD  aspirin 81 MG tablet Take 81 mg by mouth daily.   Yes Historical Provider, MD  B Complex-C (B-COMPLEX WITH VITAMIN C) tablet Take 1 tablet by mouth daily.   Yes Historical Provider, MD  benzonatate (TESSALON) 100 MG capsule Take 1-2 capsules (100-200 mg total) by mouth 3 (three) times daily as needed for cough. 09/12/16  Yes Gagandeep Pettet, PA-C  CALCIUM CITRATE-VITAMIN D3 PO Take by mouth.  Yes Historical Provider, MD  Calcium-Vitamin D-Vitamin K 660-630-16 MG-UNT-MCG CHEW Chew by mouth.   Yes Historical Provider, MD  chlorhexidine gluconate, MEDLINE KIT, (PERIDEX) 0.12 % solution Use as directed 15 mLs in the mouth or throat 2 (two) times daily. 09/01/16   Yes Jeison Delpilar, PA-C  Cinnamon 500 MG capsule Take 500 mg by mouth daily.   Yes Historical Provider, MD  COD LIVER OIL PO Take by mouth.   Yes Historical Provider, MD  doxycycline (VIBRAMYCIN) 100 MG capsule Take 1 capsule (100 mg total) by mouth 2 (two) times daily. 09/12/16  Yes Joee Iovine, PA-C  GLUCOSAMINE-CHONDROITIN-VIT D3 PO Take by mouth.   Yes Historical Provider, MD  Guaifenesin (MUCINEX MAXIMUM STRENGTH) 1200 MG TB12 Take 1 tablet (1,200 mg total) by mouth every 12 (twelve) hours as needed. 09/12/16  Yes Twan Harkin, PA-C  hydrochlorothiazide (HYDRODIURIL) 25 MG tablet Take 1 tablet (25 mg total) by mouth daily with breakfast. 09/14/16  Yes Zoe A Nolon Rod, MD  losartan (COZAAR) 100 MG tablet Take 1 tablet (100 mg total) by mouth daily. 09/14/16  Yes Zoe A Nolon Rod, MD  magnesium oxide (MAG-OX) 400 MG tablet Take 400 mg by mouth daily.   Yes Historical Provider, MD  metFORMIN (GLUCOPHAGE) 500 MG tablet Take 1 tablet (500 mg total) by mouth 2 (two) times daily with a meal. 07/04/16  Yes Bentleigh Stankus, PA-C  Multiple Vitamins-Minerals (CENTRUM SILVER ADULT 50+ PO) Take by mouth.   Yes Historical Provider, MD  Multiple Vitamins-Minerals (MEGA BASIC PO) Take 500 Int'l Units by mouth daily.   Yes Historical Provider, MD  Omega-3 Fatty Acids (FISH OIL PO) Take by mouth daily.   Yes Historical Provider, MD  OVER THE COUNTER MEDICATION 400 mg daily.   Yes Historical Provider, MD  Potassium Gluconate 595 MG CAPS Take by mouth.   Yes Historical Provider, MD  Red Yeast Rice Extract (RED YEAST RICE PO) Take 1,200 mg by mouth daily.   Yes Historical Provider, MD  Vitamin K, Phytonadione, 100 MCG TABS Take by mouth.   Yes Historical Provider, MD     No Known Allergies     Objective:  Physical Exam  Constitutional: She is oriented to person, place, and time. She appears well-developed and well-nourished. She is active and cooperative. No distress.  BP 138/90 (BP Location: Right Arm,  Patient Position: Sitting, Cuff Size: Large)   Pulse 85   Temp 97.8 F (36.6 C) (Oral)   Resp 17   Ht 5' 1.5" (1.562 m)   Wt 219 lb (99.3 kg)   SpO2 97%   BMI 40.71 kg/m   HENT:  Head: Normocephalic and atraumatic.  Right Ear: Hearing normal.  Left Ear: Hearing normal.  Mouth/Throat: Oropharynx is clear and moist and mucous membranes are normal.  Gingiva is hypertrophic, but ulceration and erythema have resolved.  Eyes: Conjunctivae are normal. No scleral icterus.  Neck: Normal range of motion. Neck supple. No thyromegaly present.  Cardiovascular: Normal rate, regular rhythm and normal heart sounds.   Pulses:      Radial pulses are 2+ on the right side, and 2+ on the left side.  Pulmonary/Chest: Effort normal and breath sounds normal.  Lymphadenopathy:       Head (right side): No tonsillar, no preauricular, no posterior auricular and no occipital adenopathy present.       Head (left side): No tonsillar, no preauricular, no posterior auricular and no occipital adenopathy present.    She has no cervical adenopathy.  Right: No supraclavicular adenopathy present.       Left: No supraclavicular adenopathy present.  Neurological: She is alert and oriented to person, place, and time. No sensory deficit.  Skin: Skin is warm, dry and intact. No rash noted. No cyanosis or erythema. Nails show no clubbing.  Psychiatric: She has a normal mood and affect. Her speech is normal and behavior is normal.           Assessment & Plan:   1. Community acquired pneumonia of right lung, unspecified part of lung Given the time since initial leucocytosis and significant improvement, elect against repeat CBC today. Complete antibiotic course. RTC in 3 weeks for follow-up CXR.  2. Essential hypertension Reasonable control today. Will recheck at 3 week follow-up visit and increase regimen if remains >140/90.  3. Paresthesia of foot, bilateral She isn't interested in additional evaluation  today, isn't interested in trying pregabalin, as her sister had side effects. We agree to update L-spine films when we do the CXR in 3 weeks.   Fara Chute, PA-C Physician Assistant-Certified [facility]

## 2016-09-21 NOTE — Progress Notes (Signed)
Subjective:    [name], female    DOB: Jan 31, 1946, 70 y.o.   MRN: [id number]  Chief Complaint  Patient presents with  . Follow-up    PT UNSURE STATES "THEY JUST CALLED ME"   HPI: Patient presents for follow-up of high blood pressure reading from visit last Monday (which was for follow-up of PNA and leukocytosis). Blood pressure measurement at that visit was 160/80 mmHg. Patient currently taking HCTZ 25 mg and Losartan 100 mg daily for her HTN. HCTZ was increased at annual visit in 2.5 months ago. Patient states blood pressure readings range from SBP of 159-179 mmHg and DBP of 80-90 mmHg when she measures it at home. States she "very rarely" has readings in the 120-130 mmHg range. Notes occasional "tension-type" pressure pain in her head but states it is not a true because she does not associate pain with it, just pressure. Denies chest pain, shortness of breath, or vision changes.   Review of blood pressure measurements from recent visits include:  06/18/2015: 121/82 mmHg 07/04/2016: 122/78 mmHg 09/01/16: 150/80 mmHg then 144/90 mmHg 09/12/16: 110/56 mmHg 09/14/2016: 160/80 mmHg  States most of her symptoms from pneumonia have improved. Notes some continued coughing with sputum, mild wheezing noted while lying down, and some continued fatigue. But states her symptoms have greatly improved since taking medication. Few days left of antibiotic per patient.  Endorses some imbalance upon standing. States sometimes she just "feel a little wobbly on my feet" but then it goes away after a few seconds. Also endorses som increased neuropathy in her feet. Checks her blood sugar at home and ranges are 90-120 per patient. Current diabetes therapy is 500 mg Metformin BID. Patient also notes some occasional "popping and clicking" noises in her lower back and inquires if this could be contributing to numbness in her feet. Denies current lower back pain.  Review of Systems  Constitutional:  Positive for fatigue. Negative for chills and fever.  HENT: Negative for congestion, ear discharge, ear pain, postnasal drip, rhinorrhea, sinus pain, sinus pressure, sore throat and tinnitus.   Eyes: Negative for photophobia, pain, discharge, itching and visual disturbance.  Respiratory: Positive for cough and wheezing. Negative for choking, chest tightness and shortness of breath.   Cardiovascular: Negative for chest pain, palpitations and leg swelling.  Gastrointestinal: Negative for abdominal pain, blood in stool, constipation, diarrhea, nausea and vomiting.  Genitourinary: Negative for difficulty urinating, dysuria, frequency, hematuria and urgency.  Neurological: Positive for numbness and headaches. Negative for dizziness, weakness and light-headedness.   No Known Allergies  Prior to Admission medications   Medication Sig Start Date End Date Taking? Authorizing Provider  Ascorbic Acid (VITAMIN C) 1000 MG tablet Take 1,000 mg by mouth daily.   Yes Historical Provider, MD  aspirin 81 MG tablet Take 81 mg by mouth daily.   Yes Historical Provider, MD  B Complex-C (B-COMPLEX WITH VITAMIN C) tablet Take 1 tablet by mouth daily.   Yes Historical Provider, MD  benzonatate (TESSALON) 100 MG capsule Take 1-2 capsules (100-200 mg total) by mouth 3 (three) times daily as needed for cough. 09/12/16  Yes Chelle Jeffery, PA-C  CALCIUM CITRATE-VITAMIN D3 PO Take by mouth.   Yes Historical Provider, MD  Calcium-Vitamin D-Vitamin K 974-163-84 MG-UNT-MCG CHEW Chew by mouth.   Yes Historical Provider, MD  chlorhexidine gluconate, MEDLINE KIT, (PERIDEX) 0.12 % solution Use as directed 15 mLs in the mouth or throat 2 (two) times daily. 09/01/16  Yes Harrison Mons, PA-C  Cinnamon 500 MG capsule Take 500 mg by mouth daily.   Yes Historical Provider, MD  COD LIVER OIL PO Take by mouth.   Yes Historical Provider, MD  doxycycline (VIBRAMYCIN) 100 MG capsule Take 1 capsule (100 mg total) by mouth 2 (two) times  daily. 09/12/16  Yes Chelle Jeffery, PA-C  GLUCOSAMINE-CHONDROITIN-VIT D3 PO Take by mouth.   Yes Historical Provider, MD  Guaifenesin (MUCINEX MAXIMUM STRENGTH) 1200 MG TB12 Take 1 tablet (1,200 mg total) by mouth every 12 (twelve) hours as needed. 09/12/16  Yes Chelle Jeffery, PA-C  hydrochlorothiazide (HYDRODIURIL) 25 MG tablet Take 1 tablet (25 mg total) by mouth daily with breakfast. 09/14/16  Yes Zoe A Nolon Rod, MD  losartan (COZAAR) 100 MG tablet Take 1 tablet (100 mg total) by mouth daily. 09/14/16  Yes Zoe A Nolon Rod, MD  magnesium oxide (MAG-OX) 400 MG tablet Take 400 mg by mouth daily.   Yes Historical Provider, MD  metFORMIN (GLUCOPHAGE) 500 MG tablet Take 1 tablet (500 mg total) by mouth 2 (two) times daily with a meal. 07/04/16  Yes Chelle Jeffery, PA-C  Multiple Vitamins-Minerals (CENTRUM SILVER ADULT 50+ PO) Take by mouth.   Yes Historical Provider, MD  Multiple Vitamins-Minerals (MEGA BASIC PO) Take 500 Int'l Units by mouth daily.   Yes Historical Provider, MD  Omega-3 Fatty Acids (FISH OIL PO) Take by mouth daily.   Yes Historical Provider, MD  OVER THE COUNTER MEDICATION 400 mg daily.   Yes Historical Provider, MD  Potassium Gluconate 595 MG CAPS Take by mouth.   Yes Historical Provider, MD  Red Yeast Rice Extract (RED YEAST RICE PO) Take 1,200 mg by mouth daily.   Yes Historical Provider, MD  Vitamin K, Phytonadione, 100 MCG TABS Take by mouth.   Yes Historical Provider, MD   Patient Active Problem List   Diagnosis Date Noted  . Carious teeth 01/04/2014  . HTN (hypertension) 06/29/2013  . DM (diabetes mellitus), type 2 ([place]) 06/29/2013  . Hyperlipidemia with target LDL less than 70 06/29/2013  . Obesity        Objective: Blood pressure 138/90, pulse 85, temperature 97.8 F (36.6 C), temperature source Oral, resp. rate 17, height 5' 1.5" (1.562 m), weight 219 lb (99.3 kg), SpO2 97 %.   Physical Exam  Constitutional: She is oriented to person, place, and time. She  appears well-developed and well-nourished. No distress.  HENT:  Head: Normocephalic and atraumatic.  Right Ear: No drainage, swelling or tenderness. Tympanic membrane is not injected, not scarred, not perforated, not erythematous, not retracted and not bulging. No middle ear effusion.  Left Ear: Tympanic membrane is not injected, not scarred, not perforated, not erythematous, not retracted and not bulging.  Nose: Nose normal. No mucosal edema, rhinorrhea, nose lacerations, sinus tenderness, nasal deformity, septal deviation or nasal septal hematoma. No epistaxis.  No foreign bodies.  Mouth/Throat: Uvula is midline and oropharynx is clear and moist. Mucous membranes are not pale, not dry and not cyanotic. She does not have dentures. No oral lesions. No trismus in the jaw. Abnormal dentition. No dental abscesses, uvula swelling or lacerations. No oropharyngeal exudate, posterior oropharyngeal edema, posterior oropharyngeal erythema or tonsillar abscesses.  Abnormal dentition with abnormal gums.  Eyes: Pupils are equal, round, and reactive to light. Right eye exhibits no discharge. Left eye exhibits no discharge. Right conjunctiva is not injected. Left conjunctiva is not injected. Right pupil is round and reactive. Left pupil is round and reactive. Pupils are equal.  Neck: Normal range of  motion. Neck supple. No tracheal deviation present. No thyromegaly present.  Cardiovascular: Normal rate, regular rhythm, S1 normal, S2 normal, normal heart sounds and intact distal pulses.  Exam reveals no gallop, no distant heart sounds and no friction rub.   No murmur heard. Pulses:      Radial pulses are 2+ on the right side, and 2+ on the left side.  Pulmonary/Chest: Effort normal and breath sounds normal. No accessory muscle usage or stridor. No respiratory distress. She has no decreased breath sounds. She has no wheezes. She has no rhonchi. She has no rales.  Lymphadenopathy:    She has no cervical adenopathy.    Neurological: She is alert and oriented to person, place, and time.  Skin: Skin is warm and dry. She is not diaphoretic.  Psychiatric: She has a normal mood and affect. Her behavior is normal.       Assessment & Plan:  1. Community acquired pneumonia of right lung, unspecified part of lung Patient demonstrating great improvement on antibiotic therapy. RTC in 3 weeks for repeat CXR or sooner if symptoms worsening rather than continuing to improve.  2. Essential hypertension Measurement of 138/90 mmHg in clinic today. Advised patient to keep log of blood pressures taken with her cuff at home each day for the next 3 weeks and bring log to next follow-up visit. Will re-evaluate blood pressure medication regimen at this time.  3. Paresthesia of foot, bilateral Advised to keep tract of blood glucose measurements at home. Will plan to evaluate for degenerative disc in lower back at next visit which could be contributing to numbness in feet.

## 2016-09-22 ENCOUNTER — Ambulatory Visit: Payer: Medicare Other | Admitting: Physician Assistant

## 2016-10-15 ENCOUNTER — Ambulatory Visit (INDEPENDENT_AMBULATORY_CARE_PROVIDER_SITE_OTHER): Payer: Medicare Other | Admitting: Physician Assistant

## 2016-10-15 ENCOUNTER — Encounter: Payer: Self-pay | Admitting: Physician Assistant

## 2016-10-15 ENCOUNTER — Ambulatory Visit (INDEPENDENT_AMBULATORY_CARE_PROVIDER_SITE_OTHER): Payer: Medicare Other

## 2016-10-15 VITALS — BP 128/76 | HR 85 | Temp 98.2°F | Resp 16 | Ht 61.5 in | Wt 221.0 lb

## 2016-10-15 DIAGNOSIS — E1121 Type 2 diabetes mellitus with diabetic nephropathy: Secondary | ICD-10-CM

## 2016-10-15 DIAGNOSIS — I10 Essential (primary) hypertension: Secondary | ICD-10-CM | POA: Diagnosis not present

## 2016-10-15 DIAGNOSIS — J189 Pneumonia, unspecified organism: Secondary | ICD-10-CM | POA: Diagnosis not present

## 2016-10-15 DIAGNOSIS — R202 Paresthesia of skin: Secondary | ICD-10-CM

## 2016-10-15 DIAGNOSIS — E785 Hyperlipidemia, unspecified: Secondary | ICD-10-CM

## 2016-10-15 NOTE — Patient Instructions (Addendum)
     IF you received an x-ray today, you will receive an invoice from [facility]. Please contact [facility] at [phone] with questions or concerns regarding your invoice.   IF you received labwork today, you will receive an invoice from [facility]. Please contact [facility] at [phone] with questions or concerns regarding your invoice.   Our billing staff will not be able to assist you with questions regarding bills from these companies.  You will be contacted with the lab results as soon as they are available. The fastest way to get your results is to activate your My Chart account. Instructions are located on the last page of this paperwork. If you have not heard from [place] regarding the results in 2 weeks, please contact this office.    We recommend that you schedule a mammogram for breast cancer screening. Typically, you do not need a referral to do this. Please contact a local imaging center to schedule your mammogram.  [facility] - [phone] or [phone]  [place] [phone] [place] [phone] [facility] - [phone]  *ask for the [facility] - [phone]  *ask for the Radiology Department [facility] - [phone]  *ask for the [place] - [phone]

## 2016-10-15 NOTE — Progress Notes (Signed)
[name], female    DOB: [birth year], 70 y.o.   MRN: [id number]  PCP: Harrison Mons, PA-C  Chief Complaint  Patient presents with  . xray    Says she was dx w/ pneumonia and would like chest xray   . Numbness    bilateral foot numbness     Subjective:   Presents for evaluation of recent community acquired pneumonia, and with persistent bilateral foot numbness.  She presented here on 09/12/2016 with illness and was noted to have upper and lower RIGHT sided infiltrates. She was treated with doxycycline.  The foot numbness is not worse, but she is ready to seek out additional evaluation and treatment. Previously took gabapentin. Didn't seem to help and caused her to feel drunk. Sister took Lyrica, "And it liked to kill her." She drove without recalling having driven.  A1C 06/2016 7.0% Drank a sip of Ginger Ale this morning with her meds, but is otherwise fasting.  Review of Systems  Constitutional: Negative for activity change, appetite change, fatigue and unexpected weight change.  HENT: Negative for congestion, dental problem, ear pain, hearing loss, mouth sores, postnasal drip, rhinorrhea, sneezing, sore throat, tinnitus and trouble swallowing.   Eyes: Negative for photophobia, pain, redness and visual disturbance.  Respiratory: Positive for cough (mild, occasional). Negative for chest tightness and shortness of breath.   Cardiovascular: Negative for chest pain, palpitations and leg swelling.  Gastrointestinal: Negative for abdominal pain, blood in stool, constipation, diarrhea, nausea and vomiting.  Genitourinary: Negative for dysuria, frequency, hematuria and urgency.  Musculoskeletal: Negative for arthralgias, gait problem, myalgias and neck stiffness.  Skin: Negative for rash.  Neurological: Positive for numbness (both feet, "feel tight, like they are bound."). Negative for dizziness, speech difficulty, weakness, light-headedness and headaches.    Hematological: Negative for adenopathy.  Psychiatric/Behavioral: Negative for confusion and sleep disturbance. The patient is not nervous/anxious.        Patient Active Problem List   Diagnosis Date Noted  . Carious teeth 01/04/2014  . HTN (hypertension) 06/29/2013  . DM (diabetes mellitus), type 2 ([place]) 06/29/2013  . Hyperlipidemia with target LDL less than 70 06/29/2013  . Obesity      Prior to Admission medications   Medication Sig Start Date End Date Taking? Authorizing Provider  Ascorbic Acid (VITAMIN C) 1000 MG tablet Take 1,000 mg by mouth daily.   Yes Historical Provider, MD  aspirin 81 MG tablet Take 81 mg by mouth daily.   Yes Historical Provider, MD  B Complex-C (B-COMPLEX WITH VITAMIN C) tablet Take 1 tablet by mouth daily.   Yes Historical Provider, MD  benzonatate (TESSALON) 100 MG capsule Take 1-2 capsules (100-200 mg total) by mouth 3 (three) times daily as needed for cough. 09/12/16  Yes Kizer Nobbe, PA-C  CALCIUM CITRATE-VITAMIN D3 PO Take by mouth.   Yes Historical Provider, MD  Calcium-Vitamin D-Vitamin K 518-841-66 MG-UNT-MCG CHEW Chew by mouth.   Yes Historical Provider, MD  chlorhexidine gluconate, MEDLINE KIT, (PERIDEX) 0.12 % solution Use as directed 15 mLs in the mouth or throat 2 (two) times daily. 09/01/16  Yes Vernadette Stutsman, PA-C  Cinnamon 500 MG capsule Take 500 mg by mouth daily.   Yes Historical Provider, MD  COD LIVER OIL PO Take by mouth.   Yes Historical Provider, MD  doxycycline (VIBRAMYCIN) 100 MG capsule Take 1 capsule (100 mg total) by mouth 2 (two) times daily. 09/12/16  Yes Zakk Borgen, PA-C  GLUCOSAMINE-CHONDROITIN-VIT D3 PO Take by mouth.  Yes Historical Provider, MD  Guaifenesin (MUCINEX MAXIMUM STRENGTH) 1200 MG TB12 Take 1 tablet (1,200 mg total) by mouth every 12 (twelve) hours as needed. 09/12/16  Yes Justyna Timoney, PA-C  hydrochlorothiazide (HYDRODIURIL) 25 MG tablet Take 1 tablet (25 mg total) by mouth daily with breakfast.  09/14/16  Yes Zoe A Nolon Rod, MD  losartan (COZAAR) 100 MG tablet Take 1 tablet (100 mg total) by mouth daily. 09/14/16  Yes Zoe A Nolon Rod, MD  magnesium oxide (MAG-OX) 400 MG tablet Take 400 mg by mouth daily.   Yes Historical Provider, MD  metFORMIN (GLUCOPHAGE) 500 MG tablet Take 1 tablet (500 mg total) by mouth 2 (two) times daily with a meal. 07/04/16  Yes Shenekia Riess, PA-C  Multiple Vitamins-Minerals (CENTRUM SILVER ADULT 50+ PO) Take by mouth.   Yes Historical Provider, MD  Multiple Vitamins-Minerals (MEGA BASIC PO) Take 500 Int'l Units by mouth daily.   Yes Historical Provider, MD  Omega-3 Fatty Acids (FISH OIL PO) Take by mouth daily.   Yes Historical Provider, MD  OVER THE COUNTER MEDICATION 400 mg daily.   Yes Historical Provider, MD  Potassium Gluconate 595 MG CAPS Take by mouth.   Yes Historical Provider, MD  Red Yeast Rice Extract (RED YEAST RICE PO) Take 1,200 mg by mouth daily.   Yes Historical Provider, MD  Vitamin K, Phytonadione, 100 MCG TABS Take by mouth.   Yes Historical Provider, MD     No Known Allergies     Objective:  Physical Exam  Constitutional: She is oriented to person, place, and time. She appears well-developed and well-nourished. She is active and cooperative. No distress.  BP 128/76   Pulse 85   Temp 98.2 F (36.8 C) (Oral)   Resp 16   Ht 5' 1.5" (1.562 m)   Wt 221 lb (100.2 kg)   SpO2 96%   BMI 41.08 kg/m   HENT:  Head: Normocephalic and atraumatic.  Right Ear: Hearing normal.  Left Ear: Hearing normal.  Eyes: Conjunctivae are normal. No scleral icterus.  Neck: Normal range of motion. Neck supple. No thyromegaly present.  Cardiovascular: Normal rate, regular rhythm and normal heart sounds.   Pulses:      Radial pulses are 2+ on the right side, and 2+ on the left side.       Dorsalis pedis pulses are 2+ on the right side, and 2+ on the left side.       Posterior tibial pulses are 2+ on the right side, and 2+ on the left side.    Pulmonary/Chest: Effort normal and breath sounds normal.  Lymphadenopathy:       Head (right side): No tonsillar, no preauricular, no posterior auricular and no occipital adenopathy present.       Head (left side): No tonsillar, no preauricular, no posterior auricular and no occipital adenopathy present.    She has no cervical adenopathy.       Right: No supraclavicular adenopathy present.       Left: No supraclavicular adenopathy present.  Neurological: She is alert and oriented to person, place, and time. No sensory deficit.  Skin: Skin is warm, dry and intact. No rash noted. No cyanosis or erythema. Nails show no clubbing.  Psychiatric: She has a normal mood and affect. Her speech is normal and behavior is normal.    Dg Chest 2 View  Result Date: 10/15/2016 CLINICAL DATA:  Follow-up pneumonia EXAM: CHEST  2 VIEW COMPARISON:  09/12/2016 FINDINGS: Lungs are clear.  No pleural  effusion or pneumothorax. The heart is normal in size. Visualized osseous structures are within normal limits. IMPRESSION: Normal chest radiographs. Electronically Signed   By: Julian Hy M.D.   On: 10/15/2016 11:08    Diabetic Foot Exam - Simple   Simple Foot Form Diabetic Foot exam was performed with the following findings:  Yes 10/15/2016 12:53 PM  Visual Inspection No deformities, no ulcerations, no other skin breakdown bilaterally:  Yes Sensation Testing Intact to touch and monofilament testing bilaterally:  Yes Pulse Check Posterior Tibialis and Dorsalis pulse intact bilaterally:  Yes Comments          Assessment & Plan:   1. Community acquired pneumonia of right lung, unspecified part of lung Infiltrates resolved. - DG Chest 2 View; Future - Care order/instruction:  2. Paresthesia of foot, bilateral Long thought due to uncontrolled diabetes. Now that diabetes is controlled, she is ready for additional evaluation. - CBC with Differential/Platelet - Ambulatory referral to  Neurology  3. Type 2 diabetes mellitus with diabetic nephropathy, without long-term current use of insulin ([place]) Await labs. Adjust regimen as indicated by results. - Comprehensive metabolic panel - Hemoglobin A1c  4. Hyperlipidemia with target LDL less than 70 Await labs. Adjust regimen as indicated by results. - Comprehensive metabolic panel - Lipid panel  5. Essential hypertension Controlled. Continue current regimen. - Comprehensive metabolic panel - CBC with Differential/Platelet    Return in about 3 months (around 01/13/2017) for re-evaluation and fasting labs.  Fara Chute, PA-C Physician Assistant-Certified [facility]

## 2016-10-16 LAB — LIPID PANEL
CHOL/HDL RATIO: 4.7 ratio — AB (ref 0.0–4.4)
CHOLESTEROL TOTAL: 184 mg/dL (ref 100–199)
HDL: 39 mg/dL — AB (ref >39)
LDL Calculated: 96 mg/dL (ref 0–99)
TRIGLYCERIDES: 243 mg/dL — AB (ref 0–149)
VLDL Cholesterol Cal: 49 mg/dL — ABNORMAL HIGH (ref 5–40)

## 2016-10-16 LAB — CBC WITH DIFFERENTIAL/PLATELET
BASOS: 0 %
Basophils Absolute: 0 10*3/uL (ref 0.0–0.2)
EOS (ABSOLUTE): 0.2 10*3/uL (ref 0.0–0.4)
EOS: 2 %
HEMATOCRIT: 36.9 % (ref 34.0–46.6)
HEMOGLOBIN: 12.2 g/dL (ref 11.1–15.9)
IMMATURE GRANS (ABS): 0.1 10*3/uL (ref 0.0–0.1)
Immature Granulocytes: 1 %
LYMPHS ABS: 2.8 10*3/uL (ref 0.7–3.1)
LYMPHS: 25 %
MCH: 27.8 pg (ref 26.6–33.0)
MCHC: 33.1 g/dL (ref 31.5–35.7)
MCV: 84 fL (ref 79–97)
MONOCYTES: 6 %
Monocytes Absolute: 0.7 10*3/uL (ref 0.1–0.9)
NEUTROS ABS: 7.6 10*3/uL — AB (ref 1.4–7.0)
Neutrophils: 66 %
Platelets: 313 10*3/uL (ref 150–379)
RBC: 4.39 x10E6/uL (ref 3.77–5.28)
RDW: 14.8 % (ref 12.3–15.4)
WBC: 11.3 10*3/uL — ABNORMAL HIGH (ref 3.4–10.8)

## 2016-10-16 LAB — COMPREHENSIVE METABOLIC PANEL
ALBUMIN: 3.9 g/dL (ref 3.6–4.8)
ALK PHOS: 98 IU/L (ref 39–117)
ALT: 12 IU/L (ref 0–32)
AST: 11 IU/L (ref 0–40)
Albumin/Globulin Ratio: 1.4 (ref 1.2–2.2)
BUN / CREAT RATIO: 20 (ref 12–28)
BUN: 17 mg/dL (ref 8–27)
Bilirubin Total: 0.3 mg/dL (ref 0.0–1.2)
CHLORIDE: 99 mmol/L (ref 96–106)
CO2: 24 mmol/L (ref 18–29)
CREATININE: 0.83 mg/dL (ref 0.57–1.00)
Calcium: 9.7 mg/dL (ref 8.7–10.3)
GFR calc Af Amer: 84 mL/min/{1.73_m2} (ref >59)
GFR calc non Af Amer: 73 mL/min/{1.73_m2} (ref >59)
GLUCOSE: 149 mg/dL — AB (ref 65–99)
Globulin, Total: 2.7 g/dL (ref 1.5–4.5)
Potassium: 3.8 mmol/L (ref 3.5–5.2)
Sodium: 141 mmol/L (ref 134–144)
Total Protein: 6.6 g/dL (ref 6.0–8.5)

## 2016-10-16 LAB — HEMOGLOBIN A1C
ESTIMATED AVERAGE GLUCOSE: 160 mg/dL
HEMOGLOBIN A1C: 7.2 % — AB (ref 4.8–5.6)

## 2016-10-16 MED ORDER — METFORMIN HCL 500 MG PO TABS: 1000.0000 mg | ORAL_TABLET | Freq: Two times a day (BID) | ORAL | 3 refills | Status: DC

## 2016-10-16 MED ORDER — ATORVASTATIN CALCIUM 20 MG PO TABS: 20.0000 mg | ORAL_TABLET | Freq: Every day | ORAL | 3 refills | Status: DC

## 2016-10-16 NOTE — Addendum Note (Signed)
Addended by: Fara Chute on: 10/16/2016 10:07 AM   Modules accepted: Orders

## 2016-11-24 ENCOUNTER — Ambulatory Visit: Payer: Medicare Other | Admitting: Neurology

## 2016-11-25 ENCOUNTER — Ambulatory Visit (INDEPENDENT_AMBULATORY_CARE_PROVIDER_SITE_OTHER): Payer: PPO | Admitting: Neurology

## 2016-11-25 ENCOUNTER — Encounter: Payer: Self-pay | Admitting: Neurology

## 2016-11-25 VITALS — BP 146/86 | HR 73 | Ht 61.5 in | Wt 220.5 lb

## 2016-11-25 DIAGNOSIS — E538 Deficiency of other specified B group vitamins: Secondary | ICD-10-CM | POA: Diagnosis not present

## 2016-11-25 DIAGNOSIS — G479 Sleep disorder, unspecified: Secondary | ICD-10-CM | POA: Diagnosis not present

## 2016-11-25 DIAGNOSIS — R202 Paresthesia of skin: Secondary | ICD-10-CM | POA: Diagnosis not present

## 2016-11-25 MED ORDER — DULOXETINE HCL 30 MG PO CPEP: 30.0000 mg | ORAL_CAPSULE | Freq: Every day | ORAL | 3 refills | Status: DC

## 2016-11-25 NOTE — Progress Notes (Signed)
Reason for visit: Paresthesias  Referring physician: Dr. Arther Abbott is a 71 y.o. female  History of present illness:  Karina Davis is a 71 year old right-handed white female with a history of diabetes and obesity. The patient came to medical attention about 5 years ago when she began having some numbness and paresthesias in the feet, she was discovered to have diabetes at that time. Over the years, she has had increasing problems with numbness and tingling in the feet and legs, the sensory changes may go up to the knees at this time, and she has been having some numbness and tingling in the hands off and on over the last year. The hands may wake her up at night. The symptoms of the feet are worse in the evening hours when she is inactive. The patient has noted some mild changes in balance, she fell yesterday in the bathroom. The patient does have some mild low back discomfort without radiation down the legs on either side. She reports some burning and stinging and itching sensations in the feet at times. She denies any issues controlling the bladder with exception of some occasional stress incontinence and she may have diarrhea off and on with the metformin. The patient also indicates that she has some shortness of breath with lying down flat at night, she sleeps with pillows up underneath her head to alleviate this. She has also noted some significant issues with snoring, she has had observed sleep apnea. She will have daytime drowsiness, if she is inactive during the day she may tend to go to sleep. The patient may drop things out of the hands from time to time. She is sent to this office for an evaluation.  Past Medical History:  Diagnosis Date  . Ankle fracture   . Diabetes mellitus without complication ([place])   . Hypertension   . Obesity     Past Surgical History:  Procedure Laterality Date  . APPENDECTOMY  1955   childhood  . COLON SURGERY  1955   Ruptured intestine     Family History  Problem Relation Age of Onset  . Cancer Mother 34    Colon  . Colon cancer Mother 48  . Stroke Father 63  . Diabetes Father   . Heart disease Father   . Hypertension Father   . Hypertension Sister   . Diabetes Sister   . Kidney disease Sister     due to HTN and DM  . Hypertension Brother   . Stroke Brother   . Hypertension Daughter   . Diabetes Daughter   . Obesity Daughter     gastric sleeve surgery 03/05/2014  . Hypertension Sister   . Drug abuse Sister     narcotic abuse  . Stroke Sister     associated with narcotic abuse  . Diabetes Sister   . Hypertension Sister   . Hyperlipidemia Sister   . Hypertension Brother   . Drug abuse Son     cocaine  . Stroke Brother     Social history:  reports that she has never smoked. She has never used smokeless tobacco. She reports that she does not drink alcohol or use drugs.  Medications:  Prior to Admission medications   Medication Sig Start Date End Date Taking? Authorizing Provider  Ascorbic Acid (VITAMIN C) 1000 MG tablet Take 1,000 mg by mouth daily.   Yes Historical Provider, MD  aspirin 81 MG tablet Take 81 mg by mouth daily.   Yes  Historical Provider, MD  B Complex-C (B-COMPLEX WITH VITAMIN C) tablet Take 1 tablet by mouth daily.   Yes Historical Provider, MD  benzonatate (TESSALON) 100 MG capsule Take 1-2 capsules (100-200 mg total) by mouth 3 (three) times daily as needed for cough. 09/12/16  Yes Chelle Jeffery, PA-C  CALCIUM CITRATE-VITAMIN D3 PO Take by mouth.   Yes Historical Provider, MD  Calcium-Vitamin D-Vitamin K 476-546-50 MG-UNT-MCG CHEW Chew by mouth.   Yes Historical Provider, MD  chlorhexidine gluconate, MEDLINE KIT, (PERIDEX) 0.12 % solution Use as directed 15 mLs in the mouth or throat 2 (two) times daily. 09/01/16  Yes Chelle Jeffery, PA-C  Cinnamon 500 MG capsule Take 500 mg by mouth daily.   Yes Historical Provider, MD  COD LIVER OIL PO Take by mouth.   Yes Historical Provider, MD   GLUCOSAMINE-CHONDROITIN-VIT D3 PO Take by mouth.   Yes Historical Provider, MD  Guaifenesin (MUCINEX MAXIMUM STRENGTH) 1200 MG TB12 Take 1 tablet (1,200 mg total) by mouth every 12 (twelve) hours as needed. 09/12/16  Yes Chelle Jeffery, PA-C  hydrochlorothiazide (HYDRODIURIL) 25 MG tablet Take 1 tablet (25 mg total) by mouth daily with breakfast. 09/14/16  Yes Zoe A Nolon Rod, MD  losartan (COZAAR) 100 MG tablet Take 1 tablet (100 mg total) by mouth daily. 09/14/16  Yes Zoe A Nolon Rod, MD  magnesium oxide (MAG-OX) 400 MG tablet Take 400 mg by mouth daily.   Yes Historical Provider, MD  metFORMIN (GLUCOPHAGE) 500 MG tablet Take 2 tablets (1,000 mg total) by mouth 2 (two) times daily with a meal. 10/16/16  Yes Chelle Jeffery, PA-C  Multiple Vitamins-Minerals (CENTRUM SILVER ADULT 50+ PO) Take by mouth.   Yes Historical Provider, MD  Multiple Vitamins-Minerals (MEGA BASIC PO) Take 500 Int'l Units by mouth daily.   Yes Historical Provider, MD  Omega-3 Fatty Acids (FISH OIL PO) Take by mouth daily.   Yes Historical Provider, MD  OVER THE COUNTER MEDICATION 400 mg daily.   Yes Historical Provider, MD  Potassium Gluconate 595 MG CAPS Take by mouth.   Yes Historical Provider, MD  Red Yeast Rice Extract (RED YEAST RICE PO) Take 1,200 mg by mouth daily.   Yes Historical Provider, MD  Vitamin K, Phytonadione, 100 MCG TABS Take by mouth.   Yes Historical Provider, MD  atorvastatin (LIPITOR) 20 MG tablet Take 1 tablet (20 mg total) by mouth daily. Patient not taking: Reported on 11/25/2016 10/16/16   Harrison Mons, PA-C  DULoxetine (CYMBALTA) 30 MG capsule Take 1 capsule (30 mg total) by mouth daily. 11/25/16   Kathrynn Ducking, MD     No Known Allergies  ROS:  Out of a complete 14 system review of symptoms, the patient complains only of the following symptoms, and all other reviewed systems are negative.  Fatigue Wheezing, snoring Feeling cold Easy bruising Numbness, weakness Restless  legs  Blood pressure (!) 146/86, pulse 73, height 5' 1.5" (1.562 m), weight 220 lb 8 oz (100 kg).  Physical Exam  General: The patient is alert and cooperative at the time of the examination. The patient is morbidly obese.  Eyes: Pupils are equal, round, and reactive to light. Discs are flat bilaterally.  Neck: The neck is supple, no carotid bruits are noted.  Respiratory: The respiratory examination is clear.  Cardiovascular: The cardiovascular examination reveals a regular rate and rhythm, no obvious murmurs or rubs are noted.  Skin: Extremities are without significant edema.  Neurologic Exam  Mental status: The patient is alert and oriented  x 3 at the time of the examination. The patient has apparent normal recent and remote memory, with an apparently normal attention span and concentration ability.  Cranial nerves: Facial symmetry is present. There is good sensation of the face to pinprick and soft touch bilaterally. The strength of the facial muscles and the muscles to head turning and shoulder shrug are normal bilaterally. Speech is well enunciated, no aphasia or dysarthria is noted. Extraocular movements are full. Visual fields are full. The tongue is midline, and the patient has symmetric elevation of the soft palate. No obvious hearing deficits are noted.  Motor: The motor testing reveals 5 over 5 strength of all 4 extremities. Good symmetric motor tone is noted throughout.  Sensory: Sensory testing is intact to pinprick, soft touch, vibration sensation, and position sense on all 4 extremities, with exception of a stocking pattern pinprick sensory deficit up to the knees bilaterally. No evidence of extinction is noted.  Coordination: Cerebellar testing reveals good finger-nose-finger and heel-to-shin bilaterally.  Gait and station: Gait is normal. Tandem gait is slightly unsteady. Romberg is negative. No drift is seen. The patient is able to walk on heels and the toes  bilaterally.  Reflexes: Deep tendon reflexes are symmetric and normal bilaterally, with exception of absence of ankle jerk reflexes bilaterally. Toes are downgoing bilaterally.   Assessment/Plan:  1. History of diabetes  2. Probable mild diabetic peripheral neuropathy  3. Snoring, probable sleep apnea   The patient likely has a mild diabetic peripheral neuropathy. The patient will be set up for blood work today, she will have nerve conduction studies on both legs and one arm, EMG on 1 leg. The patient likely has sleep apnea as well, we will refer her for a sleep evaluation. The patient will follow-up for the EMG evaluation, she will have a standard revisit in 6 months. She will be started on low-dose Cymbalta, she will call for any dose adjustments. In the past, she has been on gabapentin but she did not tolerate the drug secondary to gait instability.  Jill Alexanders MD 11/25/2016 10:20 AM  [facility] [address] [place], [name] [address]  Phone [phone] Fax [phone]

## 2016-11-25 NOTE — Patient Instructions (Signed)
    We will get blood work today. EMG and NCV evaluation will be done to look at nerve function. We will start Cymbalta at night for the neuropathy pain.  Cymbalta (duloxetine) is an antidepressant medication that is commonly used for peripheral neuropathy pain or for fibromyalgia pain. As with any antidepressant medication, worsening depression can be seen. This medication can potentially cause headache, dizziness, sexual dysfunction, or nausea. If any problems are noted on this medication, please contact our office.

## 2016-11-30 LAB — MULTIPLE MYELOMA PANEL, SERUM
ALBUMIN/GLOB SERPL: 1.1 (ref 0.7–1.7)
ALPHA 1: 0.3 g/dL (ref 0.0–0.4)
Albumin SerPl Elph-Mcnc: 3.5 g/dL (ref 2.9–4.4)
Alpha2 Glob SerPl Elph-Mcnc: 1 g/dL (ref 0.4–1.0)
B-Globulin SerPl Elph-Mcnc: 1.2 g/dL (ref 0.7–1.3)
Gamma Glob SerPl Elph-Mcnc: 0.8 g/dL (ref 0.4–1.8)
Globulin, Total: 3.3 g/dL (ref 2.2–3.9)
IGA/IMMUNOGLOBULIN A, SERUM: 287 mg/dL (ref 87–352)
IGM (IMMUNOGLOBULIN M), SRM: 60 mg/dL (ref 26–217)
IgG (Immunoglobin G), Serum: 843 mg/dL (ref 700–1600)
Total Protein: 6.8 g/dL (ref 6.0–8.5)

## 2016-11-30 LAB — VITAMIN B12

## 2016-11-30 LAB — RHEUMATOID FACTOR: Rhuematoid fact SerPl-aCnc: 10.6 IU/mL (ref 0.0–13.9)

## 2016-11-30 LAB — B. BURGDORFI ANTIBODIES

## 2016-11-30 LAB — ANGIOTENSIN CONVERTING ENZYME: ANGIO CONVERT ENZYME: 37 U/L (ref 14–82)

## 2016-11-30 LAB — ANA W/REFLEX: Anti Nuclear Antibody(ANA): NEGATIVE

## 2016-12-08 ENCOUNTER — Encounter: Payer: Self-pay | Admitting: Physician Assistant

## 2016-12-08 ENCOUNTER — Ambulatory Visit (INDEPENDENT_AMBULATORY_CARE_PROVIDER_SITE_OTHER): Payer: PPO | Admitting: Physician Assistant

## 2016-12-08 VITALS — BP 178/80 | HR 82 | Temp 98.3°F | Resp 16 | Ht 61.5 in | Wt 218.0 lb

## 2016-12-08 DIAGNOSIS — Z23 Encounter for immunization: Secondary | ICD-10-CM

## 2016-12-08 DIAGNOSIS — E1121 Type 2 diabetes mellitus with diabetic nephropathy: Secondary | ICD-10-CM

## 2016-12-08 DIAGNOSIS — Z1231 Encounter for screening mammogram for malignant neoplasm of breast: Secondary | ICD-10-CM

## 2016-12-08 DIAGNOSIS — I10 Essential (primary) hypertension: Secondary | ICD-10-CM | POA: Diagnosis not present

## 2016-12-08 DIAGNOSIS — E2839 Other primary ovarian failure: Secondary | ICD-10-CM

## 2016-12-08 DIAGNOSIS — L03319 Cellulitis of trunk, unspecified: Secondary | ICD-10-CM | POA: Diagnosis not present

## 2016-12-08 DIAGNOSIS — Z9189 Other specified personal risk factors, not elsewhere classified: Secondary | ICD-10-CM | POA: Diagnosis not present

## 2016-12-08 DIAGNOSIS — L02219 Cutaneous abscess of trunk, unspecified: Secondary | ICD-10-CM | POA: Diagnosis not present

## 2016-12-08 DIAGNOSIS — E785 Hyperlipidemia, unspecified: Secondary | ICD-10-CM | POA: Diagnosis not present

## 2016-12-08 MED ORDER — OMEGA-3-ACID ETHYL ESTERS 1 G PO CAPS: 1.0000 g | ORAL_CAPSULE | Freq: Two times a day (BID) | ORAL | 3 refills | Status: DC

## 2016-12-08 MED ORDER — SULFAMETHOXAZOLE-TRIMETHOPRIM 800-160 MG PO TABS: 1.0000 | ORAL_TABLET | Freq: Two times a day (BID) | ORAL | 0 refills | Status: DC

## 2016-12-08 MED ORDER — ZOSTER VAC RECOMB ADJUVANTED 50 MCG/0.5ML IM SUSR: 50.0000 ug | Freq: Once | INTRAMUSCULAR | 0 refills | Status: AC

## 2016-12-08 MED ORDER — CHLORHEXIDINE GLUCONATE 0.12% ORAL RINSE (MEDLINE KIT): 15.0000 mL | Freq: Two times a day (BID) | OROMUCOSAL | 3 refills | Status: DC

## 2016-12-08 NOTE — Patient Instructions (Addendum)
As your lifestyle changes take effect, we will try to reduce and eliminate whatever medications we can. In the meantime, PLEASE take the medications as prescribed. In place of the OTC fish oil, please take the Lovaza, which has been shown to improve triglycerides better than the OTC products.  I encourage you to start the Cymbalta, which can help with the peripheral neuropathy and the mood you are experiencing.    IF you received an x-ray today, you will receive an invoice from [facility]. Please contact [facility] at [phone] with questions or concerns regarding your invoice.   IF you received labwork today, you will receive an invoice from [facility]. Please contact [facility] at [phone] with questions or concerns regarding your invoice.   Our billing staff will not be able to assist you with questions regarding bills from these companies.  You will be contacted with the lab results as soon as they are available. The fastest way to get your results is to activate your My Chart account. Instructions are located on the last page of this paperwork. If you have not heard from [place] regarding the results in 2 weeks, please contact this office.     TAKE the antibiotic as prescribed. Apply a warm compress to the area for 15-20 minutes 2-4 times each day. Change the dressing if it becomes saturated, leaks or falls off.

## 2016-12-08 NOTE — Progress Notes (Signed)
[name], female    DOB: 1946/02/05, 71 y.o.   MRN: [id number]  PCP: Harrison Mons, PA-C  Chief Complaint  Patient presents with  . Follow-up    3 months/labs  . Medication Refill    peridex    Subjective:   Presents for follow up for her A1C, cholesterol, HTN and for stress. She was seen October 15 2016 for a follow-up from her CAP treatment course and she was diagnosed with Dm. She states she has been trying lifestyle modification and has significantly reduced her sugar intake, is eating out less and being more aware of her diet. She also started weight watchers last week in order to "not take medicine" She also has been dealing with quite a bit of work stress since she lost her car [employer] bre[employer] and has been struggling financially for the better part of last year. She states she is having some days better than others but feels like she doesn't want to get out of bed in the morning.   Review of Systems  Constitutional: Negative.   HENT: Negative.   Eyes: Negative.   Respiratory: Negative.   Cardiovascular: Negative.   Gastrointestinal: Negative.        Patient Active Problem List   Diagnosis Date Noted  . Paresthesia 11/25/2016  . Carious teeth 01/04/2014  . HTN (hypertension) 06/29/2013  . DM (diabetes mellitus), type 2 ([place]) 06/29/2013  . Hyperlipidemia with target LDL less than 70 06/29/2013  . Obesity      Prior to Admission medications   Medication Sig Start Date End Date Taking? Authorizing Provider  Ascorbic Acid (VITAMIN C) 1000 MG tablet Take 1,000 mg by mouth daily.   Yes Historical Provider, MD  aspirin 81 MG tablet Take 81 mg by mouth daily.   Yes Historical Provider, MD  atorvastatin (LIPITOR) 20 MG tablet Take 1 tablet (20 mg total) by mouth daily. 10/16/16  Yes Chelle Jeffery, PA-C  B Complex-C (B-COMPLEX WITH VITAMIN C) tablet Take 1 tablet by mouth daily.   Yes Historical Provider, MD  benzonatate (TESSALON) 100 MG capsule  Take 1-2 capsules (100-200 mg total) by mouth 3 (three) times daily as needed for cough. 09/12/16  Yes Chelle Jeffery, PA-C  CALCIUM CITRATE-VITAMIN D3 PO Take by mouth.   Yes Historical Provider, MD  Calcium-Vitamin D-Vitamin K [id number] MG-UNT-MCG CHEW Chew by mouth.   Yes Historical Provider, MD  chlorhexidine gluconate, MEDLINE KIT, (PERIDEX) 0.12 % solution Use as directed 15 mLs in the mouth or throat 2 (two) times daily. 09/01/16  Yes Chelle Jeffery, PA-C  Cinnamon 500 MG capsule Take 500 mg by mouth daily.   Yes Historical Provider, MD  COD LIVER OIL PO Take by mouth.   Yes Historical Provider, MD  DULoxetine (CYMBALTA) 30 MG capsule Take 1 capsule (30 mg total) by mouth daily. 11/25/16  Yes Kathrynn Ducking, MD  GLUCOSAMINE-CHONDROITIN-VIT D3 PO Take by mouth.   Yes Historical Provider, MD  Guaifenesin (MUCINEX MAXIMUM STRENGTH) 1200 MG TB12 Take 1 tablet (1,200 mg total) by mouth every 12 (twelve) hours as needed. 09/12/16  Yes Chelle Jeffery, PA-C  hydrochlorothiazide (HYDRODIURIL) 25 MG tablet Take 1 tablet (25 mg total) by mouth daily with breakfast. 09/14/16  Yes Zoe A Nolon Rod, MD  losartan (COZAAR) 100 MG tablet Take 1 tablet (100 mg total) by mouth daily. 09/14/16  Yes Zoe A Nolon Rod, MD  magnesium oxide (MAG-OX) 400 MG tablet Take 400 mg by mouth daily.  Yes Historical Provider, MD  metFORMIN (GLUCOPHAGE) 500 MG tablet Take 2 tablets (1,000 mg total) by mouth 2 (two) times daily with a meal. 10/16/16  Yes Chelle Jeffery, PA-C  Multiple Vitamins-Minerals (CENTRUM SILVER ADULT 50+ PO) Take by mouth.   Yes Historical Provider, MD  Multiple Vitamins-Minerals (MEGA BASIC PO) Take 500 Int'l Units by mouth daily.   Yes Historical Provider, MD  Omega-3 Fatty Acids (FISH OIL PO) Take by mouth daily.   Yes Historical Provider, MD  OVER THE COUNTER MEDICATION 400 mg daily.   Yes Historical Provider, MD  Potassium Gluconate 595 MG CAPS Take by mouth.   Yes Historical Provider, MD  Red Yeast  Rice Extract (RED YEAST RICE PO) Take 1,200 mg by mouth daily.   Yes Historical Provider, MD  Vitamin K, Phytonadione, 100 MCG TABS Take by mouth.   Yes Historical Provider, MD     Allergies  Allergen Reactions  . Gabapentin     Gait instability       Objective:  Physical Exam  Constitutional: She is oriented to person, place, and time. She appears well-developed and well-nourished.  Eyes: Pupils are equal, round, and reactive to light.  Cardiovascular: Normal rate, regular rhythm, normal heart sounds and intact distal pulses.   Pulmonary/Chest: Effort normal and breath sounds normal.  Neurological: She is alert and oriented to person, place, and time.  Skin: Skin is dry. There is erythema.     Psychiatric: Her speech is normal and behavior is normal. Judgment and thought content normal. Cognition and memory are normal. She exhibits a depressed mood (Felt "Down" since she lost her [place]).  She denied suicidal ideations or having a plan. Has felt more "down" but is getting better. Her Neurologist has Rx Cymbalta for neuropathy which I told her would help her stress/depression.    Pt states she hasn't taken her BP medicine taken last night, vitals last visit were WNL for her. Vitals:   12/08/16 1341 12/08/16 1344  BP: (!) 188/82 (!) 178/80  Pulse: 82   Resp: 16   Temp: 98.3 F (36.8 C)         Assessment & Plan:   1. Type 2 diabetes mellitus with diabetic nephropathy, without long-term current use of insulin ([place]) She should take the metformin as directed by Harrison Mons, PA-C last visit as well as the Cymbalta as prescribed by her Neuroligst.  - Comprehensive metabolic panel - Hemoglobin A1c - Ambulatory referral to diabetic education  2. Essential hypertension Continue lifestyle modification as well as take her Losartan and HCZT.   - Comprehensive metabolic panel - CBC with Differential/Platelet  3. Hyperlipidemia with target LDL less than 70 Continue  lifestyle modification and begin statin medicaiton as prescribed by Harrison Mons, PA-C as well as her Lovaza as a replacement for her OTC fish oil.  - Comprehensive metabolic panel - Lipid panel - omega-3 acid ethyl esters (LOVAZA) 1 g capsule; Take 1 capsule (1 g total) by mouth 2 (two) times daily.  Dispense: 180 capsule; Refill: 3  4. Need for shingles vaccine New prescription sent to her pharmacy. CDC recommended Shingrix.  - Zoster Vac Recomb Adjuvanted ([place]) 50 MCG SUSR; Inject 50 mcg into the muscle once.  Dispense: 1 each; Refill: 0  5. Encounter for screening mammogram for breast cancer She aggred to schedule her Mammogram - MM DIGITAL SCREENING BILATERAL; Future  6. Estrogen deficiency Plan not addressed during this visit, but at her follow-up appointment in two weeks we  will engage her in dialog.  - DG Bone Density; Future  7. Poor oral hygiene Once finaically able she will establish care with a dentist to begin definitive treatment for her dental caries and gingivitis. Until then she will use Preidex mouth wash to help minimize pain and reduce inflammation.  - chlorhexidine gluconate, MEDLINE KIT, (PERIDEX) 0.12 % solution; Use as directed 15 mLs in the mouth or throat 2 (two) times daily.  Dispense: 1893 mL; Refill: 3  8. Cellulitis and abscess of trunk Take prescribed antibiotics for 10 days and follow up with [place] in two weeks for a wound check. - WOUND CULTURE - sulfamethoxazole-trimethoprim (BACTRIM DS,SEPTRA DS) 800-160 MG tablet; Take 1 tablet by mouth 2 (two) times daily.  Dispense: 20 tablet; Refill: 0

## 2016-12-08 NOTE — Progress Notes (Signed)
[name], female    DOB: [birth year], 71 y.o.   MRN: [id number]  PCP: Harrison Mons, PA-C  Chief Complaint  Patient presents with  . Follow-up    3 months/labs/ pt did not take her BP med today  . Medication Refill    peridex    Subjective:   Presents for evaluation of diabetes, HTN and lipids.  She has been working hard to make lifestyle changes, having reduced her sugar intake and is preparing more meals at home. She started Weight Watchers last week and her goal is to not need medication to treat her conditions.  She is not taking the metformin regularly as prescribed. She has NOT taken her BP meds yet today.   Financial struggles this past year are weighing on her, and she is supporting her brother who had a stroke last fall. Feeling depressed. Some days she doesn't want to get out of bed.  Her neurologist has recently prescribed duloxetine to address peripheral neuropathy.  In addition, she notes a red lump on the RIGHT flank. Her daughter has squeezed some stuff out of it, but it has become larger and tender.    Depression screen [facility] 2/9 12/08/2016 10/15/2016 09/21/2016 09/14/2016 09/12/2016  Decreased Interest 0 0 0 0 0  Down, Depressed, Hopeless 0 0 0 0 0  PHQ - 2 Score 0 0 0 0 0       Review of Systems As above. Denies chest pain, shortness of breath, HA, dizziness, vision change, nausea, vomiting, diarrhea, constipation, melena, hematochezia, dysuria, increased urinary urgency or frequency, increased hunger or thirst, unintentional weight change, unexplained myalgias or arthralgias, rash.     Patient Active Problem List   Diagnosis Date Noted  . Paresthesia 11/25/2016  . Carious teeth 01/04/2014  . HTN (hypertension) 06/29/2013  . DM (diabetes mellitus), type 2 ([place]) 06/29/2013  . Hyperlipidemia with target LDL less than 70 06/29/2013  . Obesity      Prior to Admission medications   Medication Sig Start Date End Date Taking?  Authorizing Provider  Ascorbic Acid (VITAMIN C) 1000 MG tablet Take 1,000 mg by mouth daily.   Yes Historical Provider, MD  aspirin 81 MG tablet Take 81 mg by mouth daily.   Yes Historical Provider, MD  atorvastatin (LIPITOR) 20 MG tablet Take 1 tablet (20 mg total) by mouth daily. 10/16/16  NO Braelon Sprung, PA-C  B Complex-C (B-COMPLEX WITH VITAMIN C) tablet Take 1 tablet by mouth daily.   Yes Historical Provider, MD  CALCIUM CITRATE-VITAMIN D3 PO Take by mouth.   Yes Historical Provider, MD  Calcium-Vitamin D-Vitamin K [id number] MG-UNT-MCG CHEW Chew by mouth.   Yes Historical Provider, MD  chlorhexidine gluconate, MEDLINE KIT, (PERIDEX) 0.12 % solution Use as directed 15 mLs in the mouth or throat 2 (two) times daily. 09/01/16  Yes Ridgely Anastacio, PA-C  Cinnamon 500 MG capsule Take 500 mg by mouth daily.   Yes Historical Provider, MD  COD LIVER OIL PO Take by mouth.   Yes Historical Provider, MD  DULoxetine (CYMBALTA) 30 MG capsule Take 1 capsule (30 mg total) by mouth daily. 11/25/16  NO Kathrynn Ducking, MD  GLUCOSAMINE-CHONDROITIN-VIT D3 PO Take by mouth.   Yes Historical Provider, MD  hydrochlorothiazide (HYDRODIURIL) 25 MG tablet Take 1 tablet (25 mg total) by mouth daily with breakfast. 09/14/16  Yes Zoe A Nolon Rod, MD  losartan (COZAAR) 100 MG tablet Take 1 tablet (100 mg total) by mouth daily. 09/14/16  Yes Forrest Moron, MD  magnesium oxide (MAG-OX) 400 MG tablet Take 400 mg by mouth daily.   Yes Historical Provider, MD  metFORMIN (GLUCOPHAGE) 500 MG tablet Take 2 tablets (1,000 mg total) by mouth 2 (two) times daily with a meal. PATIENT IS TAKING 500 mg BID 10/16/16  Yes Dezarai Prew, PA-C  Multiple Vitamins-Minerals (CENTRUM SILVER ADULT 50+ PO) Take by mouth.   Yes Historical Provider, MD  Multiple Vitamins-Minerals (MEGA BASIC PO) Take 500 Int'l Units by mouth daily.   Yes Historical Provider, MD  Omega-3 Fatty Acids (FISH OIL PO) Take by mouth daily.   Yes Historical Provider,  MD  OVER THE COUNTER MEDICATION 400 mg daily.   Yes Historical Provider, MD  Potassium Gluconate 595 MG CAPS Take by mouth.   Yes Historical Provider, MD  Red Yeast Rice Extract (RED YEAST RICE PO) Take 1,200 mg by mouth daily.   Yes Historical Provider, MD  Vitamin K, Phytonadione, 100 MCG TABS Take by mouth.   Yes Historical Provider, MD     Allergies  Allergen Reactions  . Gabapentin     Gait instability       Objective:  Physical Exam  Constitutional: She is oriented to person, place, and time. She appears well-developed and well-nourished. She is active and cooperative. No distress.  BP (!) 178/80   Pulse 82   Temp 98.3 F (36.8 C) (Oral)   Resp 16   Ht 5' 1.5" (1.562 m)   Wt 218 lb (98.9 kg)   SpO2 97%   BMI 40.52 kg/m   HENT:  Head: Normocephalic and atraumatic.  Right Ear: Hearing normal.  Left Ear: Hearing normal.  Mouth/Throat: Uvula is midline, oropharynx is clear and moist and mucous membranes are normal. Dental caries present. No dental abscesses or uvula swelling.  Eyes: Conjunctivae are normal. No scleral icterus.  Neck: Normal range of motion. Neck supple. No thyromegaly present.  Cardiovascular: Normal rate, regular rhythm and normal heart sounds.   Pulses:      Radial pulses are 2+ on the right side, and 2+ on the left side.  Pulmonary/Chest: Effort normal and breath sounds normal.  Lymphadenopathy:       Head (right side): No tonsillar, no preauricular, no posterior auricular and no occipital adenopathy present.       Head (left side): No tonsillar, no preauricular, no posterior auricular and no occipital adenopathy present.    She has no cervical adenopathy.       Right: No supraclavicular adenopathy present.       Left: No supraclavicular adenopathy present.  Neurological: She is alert and oriented to person, place, and time. No sensory deficit.  Skin: Skin is warm, dry and intact. No rash noted. No cyanosis or erythema. Nails show no clubbing.       Psychiatric: She has a normal mood and affect. Her speech is normal and behavior is normal.   BP Readings from Last 3 Encounters:  12/08/16 (!) 178/80  11/25/16 (!) 146/86  10/15/16 128/76    PROCEDURE: Verbal Consent Obtained. Local anesthesia with 1 cc of 2% lidocaine plain.  11 blade used to incise the lesion centrally.  Purulence and large amount of sebaceous material expressed. Cleansed and dressed.     Assessment & Plan:   1. Type 2 diabetes mellitus with diabetic nephropathy, without long-term current use of insulin ([place]) Await lab results. Counseled on the importance of compliance with metformin. Education re: diabetes and nutrition may help her achieve  the goal of minimizing/eliminating medications. - Comprehensive metabolic panel - Hemoglobin A1c - Ambulatory referral to diabetic education  2. Essential hypertension Uncontrolled today, non-compliant with medication. Counseled. - Comprehensive metabolic panel - CBC with Differential/Platelet  3. Hyperlipidemia with target LDL less than 70 Add Lovaza. Continue statin. - Comprehensive metabolic panel - Lipid panel - omega-3 acid ethyl esters (LOVAZA) 1 g capsule; Take 1 capsule (1 g total) by mouth 2 (two) times daily.  Dispense: 180 capsule; Refill: 3  4. Need for shingles vaccine - Zoster Vac Recomb Adjuvanted ([place]) 50 MCG SUSR; Inject 50 mcg into the muscle once.  Dispense: 1 each; Refill: 0  5. Encounter for screening mammogram for breast cancer - MM DIGITAL SCREENING BILATERAL; Future  6. Estrogen deficiency - DG Bone Density; Future  7. Poor oral hygiene Once her finances are more stable, she plans to see a dentist. - chlorhexidine gluconate, MEDLINE KIT, (PERIDEX) 0.12 % solution; Use as directed 15 mLs in the mouth or throat 2 (two) times daily.  Dispense: 1893 mL; Refill: 3  8. Cellulitis and abscess of trunk Await culture. Warm compresses. Empiric antibiotic. - WOUND CULTURE -  sulfamethoxazole-trimethoprim (BACTRIM DS,SEPTRA DS) 800-160 MG tablet; Take 1 tablet by mouth 2 (two) times daily.  Dispense: 20 tablet; Refill: 0  Re-evaluate in 3 months, sooner if needed.   Fara Chute, PA-C Physician Assistant-Certified [facility] [place]

## 2016-12-09 LAB — CBC WITH DIFFERENTIAL/PLATELET
BASOS ABS: 0 10*3/uL (ref 0.0–0.2)
Basos: 0 %
EOS (ABSOLUTE): 0.2 10*3/uL (ref 0.0–0.4)
Eos: 2 %
HEMOGLOBIN: 12.4 g/dL (ref 11.1–15.9)
Hematocrit: 37.8 % (ref 34.0–46.6)
IMMATURE GRANULOCYTES: 0 %
Immature Grans (Abs): 0 10*3/uL (ref 0.0–0.1)
LYMPHS: 27 %
Lymphocytes Absolute: 2.6 10*3/uL (ref 0.7–3.1)
MCH: 28.2 pg (ref 26.6–33.0)
MCHC: 32.8 g/dL (ref 31.5–35.7)
MCV: 86 fL (ref 79–97)
MONOCYTES: 7 %
Monocytes Absolute: 0.6 10*3/uL (ref 0.1–0.9)
NEUTROS PCT: 64 %
Neutrophils Absolute: 6.4 10*3/uL (ref 1.4–7.0)
PLATELETS: 334 10*3/uL (ref 150–379)
RBC: 4.4 x10E6/uL (ref 3.77–5.28)
RDW: 15.1 % (ref 12.3–15.4)
WBC: 9.8 10*3/uL (ref 3.4–10.8)

## 2016-12-09 LAB — COMPREHENSIVE METABOLIC PANEL
ALK PHOS: 99 IU/L (ref 39–117)
ALT: 17 IU/L (ref 0–32)
AST: 17 IU/L (ref 0–40)
Albumin/Globulin Ratio: 1.4 (ref 1.2–2.2)
Albumin: 4.1 g/dL (ref 3.6–4.8)
BUN/Creatinine Ratio: 19 (ref 12–28)
BUN: 14 mg/dL (ref 8–27)
Bilirubin Total: 0.4 mg/dL (ref 0.0–1.2)
CALCIUM: 9.6 mg/dL (ref 8.7–10.3)
CO2: 24 mmol/L (ref 18–29)
Chloride: 96 mmol/L (ref 96–106)
Creatinine, Ser: 0.75 mg/dL (ref 0.57–1.00)
GFR calc Af Amer: 95 mL/min/{1.73_m2} (ref >59)
GFR calc non Af Amer: 82 mL/min/{1.73_m2} (ref >59)
GLOBULIN, TOTAL: 2.9 g/dL (ref 1.5–4.5)
Glucose: 143 mg/dL — ABNORMAL HIGH (ref 65–99)
POTASSIUM: 4.4 mmol/L (ref 3.5–5.2)
Sodium: 138 mmol/L (ref 134–144)
Total Protein: 7 g/dL (ref 6.0–8.5)

## 2016-12-09 LAB — LIPID PANEL
CHOL/HDL RATIO: 5.7 ratio — AB (ref 0.0–4.4)
Cholesterol, Total: 217 mg/dL — ABNORMAL HIGH (ref 100–199)
HDL: 38 mg/dL — ABNORMAL LOW (ref >39)
LDL Calculated: 128 mg/dL — ABNORMAL HIGH (ref 0–99)
TRIGLYCERIDES: 254 mg/dL — AB (ref 0–149)
VLDL Cholesterol Cal: 51 mg/dL — ABNORMAL HIGH (ref 5–40)

## 2016-12-09 LAB — HEMOGLOBIN A1C
ESTIMATED AVERAGE GLUCOSE: 163 mg/dL
HEMOGLOBIN A1C: 7.3 % — AB (ref 4.8–5.6)

## 2016-12-10 LAB — WOUND CULTURE

## 2016-12-16 MED ORDER — CANAGLIFLOZIN 100 MG PO TABS: 100.0000 mg | ORAL_TABLET | Freq: Every day | ORAL | 2 refills | Status: DC

## 2016-12-16 MED ORDER — ATORVASTATIN CALCIUM 40 MG PO TABS: 40.0000 mg | ORAL_TABLET | Freq: Every day | ORAL | 3 refills | Status: DC

## 2016-12-17 ENCOUNTER — Telehealth: Payer: Self-pay

## 2016-12-17 NOTE — Telephone Encounter (Signed)
Patient is calling to get the [place] fish oil replacement called into the pharmacy.  Chelle had put it in the notes but did not call it in.  Pt is currently at the pharmacy Please advise  [phone]

## 2016-12-18 NOTE — Telephone Encounter (Signed)
Record indicates that this prescription was sent to the [facility] on 12/08/2016. That pharmacy is not open until 10 am, so I left a message with the prescription information.  Patient notified via My Chart.

## 2016-12-22 ENCOUNTER — Ambulatory Visit: Payer: Self-pay | Admitting: Dietician

## 2016-12-30 ENCOUNTER — Other Ambulatory Visit: Payer: Self-pay | Admitting: Physician Assistant

## 2016-12-30 NOTE — Telephone Encounter (Signed)
PATIENT HAS CALLED HER PHARMACY AND SHE ALSO WANTED TO LEAVE A PHONE MESSAGE FOR CHELLE THAT SHE HAS HAD A COUGH FOR MOST OF THIS WEEK. IT IS STARTING TO KEEP HER AWAKE. SHE DOES NOT WANT TO COME INTO THE OFFICE TO BE SEEN. SHE SAID IT WORKED IN November WHEN CHELLE GAVE IT TO HER BEFORE.  BEST PHONE [phone] ([place]) PHARMACY CHOICE IS [facility] [place]

## 2017-01-04 ENCOUNTER — Telehealth: Payer: Self-pay | Admitting: *Deleted

## 2017-01-04 ENCOUNTER — Encounter: Payer: BLUE CROSS/BLUE SHIELD | Admitting: Neurology

## 2017-01-04 NOTE — Telephone Encounter (Signed)
Called and spoke with pt. Offered this Wed check in 1215pm instead since office closing early d/t weather. Pt declined. She has to take husband to appt then.  Advised I will have someone call her to schedule this. She verbalized understanding.   She also stated she never heard about scheduling sleep study. I advised she usually has to have initial consult first before having sleep study. She wants to make sure insurance will cover this. Advised normally she is to schedule this at check out. She stated no one explained this process to her. Advised I will have someone call to schedule this as well. She verbalized understanding.

## 2017-01-11 ENCOUNTER — Other Ambulatory Visit: Payer: Self-pay | Admitting: Pharmacist

## 2017-01-11 ENCOUNTER — Encounter: Payer: Self-pay | Admitting: Pharmacist

## 2017-01-12 NOTE — Patient Outreach (Signed)
[facility]) Care Management  [place]   01/12/2017  Karina Davis [birth year] [id number]  Subjective: Patient was called regarding medication assistance. She referred herself for medication assistance to [facility] because her brother received medication assistance with our help.  HIPAA identifiers were obtained.  Patient is a 71 year old female with a medical history significant for:  Hypertension, type 2 diabetes, obesity, and hyperlipidemia.  Patient reported she manages her own medications and said two of her medications (omega 3 fatty acids and Invokana) were difficult to afford.     Objective:   Encounter Medications: Outpatient Encounter Prescriptions as of 01/11/2017  Medication Sig Note  . Ascorbic Acid (VITAMIN C) 1000 MG tablet Take 1,000 mg by mouth daily.   Marland Kitchen aspirin 81 MG tablet Take 81 mg by mouth daily.   Marland Kitchen atorvastatin (LIPITOR) 40 MG tablet Take 1 tablet (40 mg total) by mouth daily.   . canagliflozin (INVOKANA) 100 MG TABS tablet Take 1 tablet (100 mg total) by mouth daily before breakfast.   . chlorhexidine gluconate, MEDLINE KIT, (PERIDEX) 0.12 % solution Use as directed 15 mLs in the mouth or throat 2 (two) times daily.   . Cholecalciferol (VITAMIN D) 2000 units CAPS Take 1 capsule by mouth daily.   . Coenzyme Q10 200 MG capsule Take 200 mg by mouth daily.   . hydrochlorothiazide (HYDRODIURIL) 25 MG tablet Take 1 tablet (25 mg total) by mouth daily with breakfast.   . losartan (COZAAR) 100 MG tablet Take 1 tablet (100 mg total) by mouth daily.   . magnesium oxide (MAG-OX) 400 MG tablet Take 400 mg by mouth daily.   . metFORMIN (GLUCOPHAGE) 500 MG tablet Take 2 tablets (1,000 mg total) by mouth 2 (two) times daily with a meal.   . Misc Natural Products (TART CHERRY ADVANCED) CAPS Take 1 capsule by mouth daily.   . Multiple Vitamins-Minerals (DIABETES HEALTH PO) Take 1 packet by mouth daily.   . Nutritional Supplements (BEE POLLEN/ROYAL JELLY/HONEY  PO) Take 2 tablets by mouth daily.   . Nutritional Supplements (JUICE PLUS FIBRE PO) Take 3 tablets by mouth 2 (two) times daily.   Marland Kitchen omega-3 acid ethyl esters (LOVAZA) 1 g capsule Take 1 capsule (1 g total) by mouth 2 (two) times daily.   . Omega-3 Fatty Acids (FISH OIL PO) Take by mouth daily.   . Potassium Gluconate 595 MG CAPS Take by mouth.   . Turmeric 500 MG CAPS Take 1 capsule by mouth 2 (two) times daily.   . DULoxetine (CYMBALTA) 30 MG capsule Take 1 capsule (30 mg total) by mouth daily. (Patient not taking: Reported on 01/11/2017) 01/11/2017: Felt nervous  . [DISCONTINUED] B Complex-C (B-COMPLEX WITH VITAMIN C) tablet Take 1 tablet by mouth daily.   . [DISCONTINUED] benzonatate (TESSALON) 100 MG capsule TAKE 1 TO 2 CAPSULES BY MOUTH 3 TIMES A DAY AS NEEDED FOR COUGH (Patient not taking: Reported on 01/11/2017)   . [DISCONTINUED] CALCIUM CITRATE-VITAMIN D3 PO Take by mouth. 04/24/2014: PETITE WITH K  . [DISCONTINUED] Calcium-Vitamin D-Vitamin K [id number] MG-UNT-MCG CHEW Chew by mouth.   . [DISCONTINUED] Cinnamon 500 MG capsule Take 500 mg by mouth daily.   . [DISCONTINUED] GLUCOSAMINE-CHONDROITIN-VIT D3 PO Take by mouth.   . [DISCONTINUED] Multiple Vitamins-Minerals (CENTRUM SILVER ADULT 50+ PO) Take by mouth.   . [DISCONTINUED] Multiple Vitamins-Minerals (MEGA BASIC PO) Take 500 Int'l Units by mouth daily.   . [DISCONTINUED] OVER THE COUNTER MEDICATION 400 mg daily. 04/24/2014: GREEN TEA  FAT BURNER  . [DISCONTINUED] Red Yeast Rice Extract (RED YEAST RICE PO) Take 1,200 mg by mouth daily.   . [DISCONTINUED] sulfamethoxazole-trimethoprim (BACTRIM DS,SEPTRA DS) 800-160 MG tablet Take 1 tablet by mouth 2 (two) times daily.   . [DISCONTINUED] Vitamin K, Phytonadione, 100 MCG TABS Take by mouth. 04/24/2014: THIS VITAMIN K 2   No facility-administered encounter medications on file as of 01/11/2017.     Functional Status: In your present state of health, do you have any difficulty performing the  following activities: 09/01/2016  Hearing? N  Vision? N  Difficulty concentrating or making decisions? N  Walking or climbing stairs? N  Dressing or bathing? N  Doing errands, shopping? N  Some recent data might be hidden    Fall/Depression Screening: PHQ 2/9 Scores 12/08/2016 10/15/2016 09/21/2016 09/14/2016 09/12/2016 09/01/2016 06/18/2015  PHQ - 2 Score 0 0 0 0 0 0 0    Assessment: Patient's medications and allergies were reviewed over the phone.   Drugs sorted by system:  Neurologic/Psychologic: Duloxetine-not taking said it caused her to feel very anxious  Cardiovascular: Aspirin Atorvastatin Hydrochlorothiazide Losartan Omega 3 Fatty acid Losartan  Endocrine: Vitamin D Invokana Metformin  Vitamins/Minerals: Potassium Gluconate Turmeric Juice Plus Bee Pollen Tart Cherry Diabetes Health Multivitamin Pack Magnesium oxide CoEnzyme Q-10 Vitamin C  Miscellaneous: Chlorhexidine  Medication Review Findings  Therapeutic Duplication Omega 3 OTC/Omega 3 (Lovaza)- not sure why she would need both products.  If deemed therapeutically appropriate, there could be a cost savings to the patient if one of these products was discontinue.  Multiple Nutritional supplements Patient is taking several OTC supplements. It is not clear she has an understanding of what each of them is actually for.  If deemed therapeutically appropriate, Potassium Gluconate could be discontinued-patients potassium was normal at 4.4 12/08/16. One Potassium Gluconate 595 mg tablet is equal to 2.5 mEq of Potassium Chloride.  Discontinuing this supplement could also be a cost savings to the patient.   It may also be beneficial to complete a short benefit/cost analysis of all the patient's nutritional supplements.  Combination Product Available (Invokamet)-could save the patient $10 over a 3 month time span and also decrease pill burden   Medication Assistance Findings;  Patient has Health Team  Advantage Plan 1  Most of her prescribed medications are tier 1-2 with the exception of: Invokana- tier 3--$45 for a one month supply or $90 for a 3 month supply.  Patient reported paying $45 for a one month supply.  She was shown how getting a 46-monthsupply would save her some money.  Omega 3 Fatty Acid capsules are tier 2 and are $30 for a 3 month supply.  Patient was also educated about getting her medications from a mail order company.  The mail order pharmacy for Health Team Advantage is [facility].  Patient was provided with information on how to enroll and get her prescriptions transferred from her local pharmacy.  It is unclear if the patient is eligible for the Extra Help program.  An application was completed on her behalf. Patient will receive a letter from the [place] within two weeks with a determination.  [place] will provide Lovaza at no cost for patients who meet their financial requirements AND have spent $600 out of-pocket on medications. Although the patient may qualify from an income perspective, she has not spent $600 out-of-pocket on medications.  [place] Assistance Program will provide Invokana at no cost  for patients who meet their financial requirements AND have spent 4% to their total household income on medications. Again, patient has not spent this much out of pocket on medications yet.   Plan:  1. Follow up with patient in 2 weeks to see if she heard from Social Security and to see if she was able to get set up with [facility].  2. Route note to PCP  Elayne Guerin, PharmD, [place] Clinical Pharmacist [phone]

## 2017-01-25 ENCOUNTER — Other Ambulatory Visit: Payer: Self-pay | Admitting: Pharmacist

## 2017-01-25 ENCOUNTER — Institutional Professional Consult (permissible substitution): Payer: PPO | Admitting: Neurology

## 2017-01-25 ENCOUNTER — Encounter: Payer: Self-pay | Admitting: Pharmacist

## 2017-01-25 NOTE — Patient Outreach (Signed)
[facility]) Care Management  01/25/2017  Rmoni Keplinger [birth year] [id number]   Called patient to follow up on [facility] account set up and to see if she heard from [place] about her "extra help" application.  HIPAA identifiers were obtained.  Patient said she had not had an opportunity to set up her [facility] account as of yet but she planned on getting it set up today.  Patient also confirmed she had NOT received communication from [employer] about her "extra help" application.    Plan:  I will follow up with the patient in 7-10 business days.   Elayne Guerin, PharmD, [place] Clinical Pharmacist [phone]

## 2017-02-04 ENCOUNTER — Other Ambulatory Visit: Payer: Self-pay | Admitting: Pharmacist

## 2017-02-04 NOTE — Patient Outreach (Signed)
[place] [facility]) Care Management  02/04/2017  Karina Davis 12-30-1945 [id number]   Patient was called to follow up on the extra help application completed online.  HIPAA identifiers were obtained.  Patient said she received a letter from [employer] requesting information about her property.  After gathering the information, patient reported the Social Security representative was going to submit the new property information.  While the patient was on the phone, HealthTeam Advantage was called to obtain her current medication expenditures.  [employer] connected [place] with [facility].  According to [facility], patient has a true-out-of-pocket spend of:  $213.17 and has a total drug spend of $1215.28.   In addition, the patient was set up with [facility], clinical.  While on the phone with [facility], it was realized the patient's coverage with HealthTeam Advantage terminated 01/23/17.  Patient stated she would call Social Security about her coverage because she recently paid her premium but was not able to pay the premium on time.  Plan:  1.  Since the patient has not spent the necessary funds for medications required to obtain Invokana and Lovaza from patient assistance program and is currently terminated on HealthTeam Advantage, I will follow up with the patient in one month .     Elayne Guerin, PharmD, [place] Clinical Pharmacist [phone]

## 2017-02-09 ENCOUNTER — Encounter: Payer: PPO | Admitting: Neurology

## 2017-02-09 ENCOUNTER — Telehealth: Payer: Self-pay | Admitting: *Deleted

## 2017-02-09 NOTE — Telephone Encounter (Signed)
Called and LVM for pt letting her know appt today cancelled. Office closed, no power. Will call back to r/s appt once back open. Apologized for any inconvenience.

## 2017-02-11 ENCOUNTER — Institutional Professional Consult (permissible substitution): Payer: Self-pay | Admitting: Neurology

## 2017-02-16 ENCOUNTER — Institutional Professional Consult (permissible substitution): Payer: Self-pay | Admitting: Neurology

## 2017-02-17 ENCOUNTER — Encounter: Payer: Self-pay | Admitting: Neurology

## 2017-02-19 ENCOUNTER — Telehealth: Payer: Self-pay | Admitting: Physician Assistant

## 2017-02-19 DIAGNOSIS — Z9189 Other specified personal risk factors, not elsewhere classified: Secondary | ICD-10-CM

## 2017-02-19 DIAGNOSIS — E1121 Type 2 diabetes mellitus with diabetic nephropathy: Secondary | ICD-10-CM

## 2017-02-19 DIAGNOSIS — E785 Hyperlipidemia, unspecified: Secondary | ICD-10-CM

## 2017-02-19 DIAGNOSIS — I10 Essential (primary) hypertension: Secondary | ICD-10-CM

## 2017-02-19 NOTE — Telephone Encounter (Signed)
Pt has a new pharmacy.  It is [facility].  She says they need a list of her medications faxed to them.  Scripts need to be filled for 90 each.  Fax # for them [phone].    Call her [phone]

## 2017-02-22 MED ORDER — OMEGA-3-ACID ETHYL ESTERS 1 G PO CAPS: 1.0000 g | ORAL_CAPSULE | Freq: Two times a day (BID) | ORAL | 3 refills | Status: DC

## 2017-02-22 MED ORDER — CANAGLIFLOZIN 100 MG PO TABS: 100.0000 mg | ORAL_TABLET | Freq: Every day | ORAL | 2 refills | Status: DC

## 2017-02-22 MED ORDER — CHLORHEXIDINE GLUCONATE 0.12% ORAL RINSE (MEDLINE KIT): 15.0000 mL | Freq: Two times a day (BID) | OROMUCOSAL | 3 refills | Status: DC

## 2017-02-22 MED ORDER — LOSARTAN POTASSIUM 100 MG PO TABS: 100.0000 mg | ORAL_TABLET | Freq: Every day | ORAL | 3 refills | Status: DC

## 2017-02-22 MED ORDER — HYDROCHLOROTHIAZIDE 25 MG PO TABS: 25.0000 mg | ORAL_TABLET | Freq: Every day | ORAL | 3 refills | Status: DC

## 2017-02-22 MED ORDER — ATORVASTATIN CALCIUM 40 MG PO TABS: 40.0000 mg | ORAL_TABLET | Freq: Every day | ORAL | 3 refills | Status: DC

## 2017-02-22 MED ORDER — DULOXETINE HCL 30 MG PO CPEP: 30.0000 mg | ORAL_CAPSULE | Freq: Every day | ORAL | 3 refills | Status: DC

## 2017-02-22 MED ORDER — METFORMIN HCL 500 MG PO TABS: 1000.0000 mg | ORAL_TABLET | Freq: Two times a day (BID) | ORAL | 3 refills | Status: DC

## 2017-03-02 ENCOUNTER — Telehealth: Payer: Self-pay | Admitting: Neurology

## 2017-03-02 ENCOUNTER — Encounter: Payer: PPO | Admitting: Neurology

## 2017-03-02 NOTE — Telephone Encounter (Signed)
This patient canceled same day of the EMG nerve conduction study appointment today.

## 2017-03-03 ENCOUNTER — Encounter: Payer: Self-pay | Admitting: Neurology

## 2017-03-04 ENCOUNTER — Encounter: Payer: Self-pay | Admitting: Pharmacist

## 2017-03-04 ENCOUNTER — Other Ambulatory Visit: Payer: Self-pay | Admitting: Pharmacist

## 2017-03-04 NOTE — Patient Outreach (Signed)
[facility]) Care Management  03/04/2017  Karina Davis 1946-08-21 [id number]  Patient was called to follow up on medication management.  HIPAA identifiers were obtained.  Patient confirmed she received "extra help" from [employer] and is now able to afford her medications.  In addition, she set up an account with [facility].  Plan:  Close Pharmacy case as the patient's needs have been met. She has my number and communicated understanding she can all me at ay time with medication related issues.  Elayne Guerin, PharmD, [place] Clinical Pharmacist [phone]

## 2017-03-09 ENCOUNTER — Encounter: Payer: Self-pay | Admitting: Physician Assistant

## 2017-03-09 ENCOUNTER — Ambulatory Visit (INDEPENDENT_AMBULATORY_CARE_PROVIDER_SITE_OTHER): Payer: PPO | Admitting: Physician Assistant

## 2017-03-09 VITALS — BP 130/90 | HR 76 | Temp 97.4°F | Resp 16 | Ht 61.5 in | Wt 203.4 lb

## 2017-03-09 DIAGNOSIS — I10 Essential (primary) hypertension: Secondary | ICD-10-CM

## 2017-03-09 DIAGNOSIS — R202 Paresthesia of skin: Secondary | ICD-10-CM | POA: Diagnosis not present

## 2017-03-09 DIAGNOSIS — E785 Hyperlipidemia, unspecified: Secondary | ICD-10-CM | POA: Diagnosis not present

## 2017-03-09 DIAGNOSIS — E1121 Type 2 diabetes mellitus with diabetic nephropathy: Secondary | ICD-10-CM

## 2017-03-09 DIAGNOSIS — K029 Dental caries, unspecified: Secondary | ICD-10-CM

## 2017-03-09 DIAGNOSIS — Z6837 Body mass index (BMI) 37.0-37.9, adult: Secondary | ICD-10-CM | POA: Diagnosis not present

## 2017-03-09 MED ORDER — GABAPENTIN 300 MG PO CAPS: 300.0000 mg | ORAL_CAPSULE | Freq: Three times a day (TID) | ORAL | 3 refills | Status: DC

## 2017-03-09 MED ORDER — GABAPENTIN 300 MG PO CAPS: 300.0000 mg | ORAL_CAPSULE | Freq: Every day | ORAL | 3 refills | Status: DC

## 2017-03-09 MED ORDER — CANAGLIFLOZIN 100 MG PO TABS: 100.0000 mg | ORAL_TABLET | Freq: Every day | ORAL | 3 refills | Status: DC

## 2017-03-09 NOTE — Progress Notes (Signed)
[name], female    DOB: Sep 23, 1946, 71 y.o.   MRN: [id number]  PCP: Harrison Mons, PA-C  Chief Complaint  Patient presents with  . Diabetes    follow up 3 months  . Medication Refill    Canagliflozin 119m-90 day supply needed    Subjective:   Presents for evaluation of diabetes.  Last A1C, in February, was essentially unchanged at 7.3%. TC 217 TG 254 HDL 38 LDL 128  Using Weight Watchers. Plans to take 20 lbs at a time. Goal is 150 lbs, size 10-12.  Has found a dentist who will allow her to make payments for care. Will be getting dental care to address multiple caries. Peridex is helping her gum inflammation.  Notes reduced sense of taste and smell.  Started when she had pneumonia. Is improving.  Sometimes experiences brief pain in the top portion of the external ear. No tenderness.  Has had the first Shingrix. Will schedule the second. Has not had the NCS with neurology. Did not tolerate Cymbalta. Felt like she was jumping out of her skin. Gabapentin made her dizzy, but helped her rest. She'd like to restart it. She took one of her husband's tablets, 300 mg, with good relief.   Review of Systems As above. No CP, SOB, HA. No nausea, vomiting, diarrhea. No polydipsia or polyuria.    Patient Active Problem List   Diagnosis Date Noted  . Cellulitis and abscess of trunk 12/08/2016  . Paresthesia 11/25/2016  . Carious teeth 01/04/2014  . HTN (hypertension) 06/29/2013  . DM (diabetes mellitus), type 2 ([place] 06/29/2013  . Hyperlipidemia with target LDL less than 70 06/29/2013  . Obesity      Prior to Admission medications   Medication Sig Start Date End Date Taking? Authorizing Provider  Ascorbic Acid (VITAMIN C) 1000 MG tablet Take 1,000 mg by mouth daily.   Yes [provider]  aspirin 81 MG tablet Take 81 mg by mouth daily.   Yes [provider]  atorvastatin (LIPITOR) 40 MG tablet Take 1 tablet (40 mg total)  by mouth daily. 02/22/17  Yes Lynita Groseclose, PA-C  Calcium Carb-Cholecalciferol (CALCIUM 600-D PO) Take by mouth.   Yes [provider]  canagliflozin (INVOKANA) 100 MG TABS tablet Take 1 tablet (100 mg total) by mouth daily before breakfast. 02/22/17  Yes Raniyah Curenton, PA-C  chlorhexidine gluconate, MEDLINE KIT, (PERIDEX) 0.12 % solution Use as directed 15 mLs in the mouth or throat 2 (two) times daily. 02/22/17  Yes Carlei Huang, PA-C  Cholecalciferol (VITAMIN D) 2000 units CAPS Take 1 capsule by mouth daily.   Yes [provider]  Coenzyme Q10 200 MG capsule Take 200 mg by mouth daily.   Yes [provider]  hydrochlorothiazide (HYDRODIURIL) 25 MG tablet Take 1 tablet (25 mg total) by mouth daily with breakfast. 02/22/17  Yes Mustaf Antonacci, PA-C  losartan (COZAAR) 100 MG tablet Take 1 tablet (100 mg total) by mouth daily. 02/22/17  Yes Saleema Weppler, PA-C  magnesium oxide (MAG-OX) 400 MG tablet Take 400 mg by mouth daily.   Yes [provider]  metFORMIN (GLUCOPHAGE) 500 MG tablet Take 2 tablets (1,000 mg total) by mouth 2 (two) times daily with a meal. 02/22/17  Yes Alawna Graybeal, PA-C  Misc Natural Products (TART CHERRY ADVANCED) CAPS Take 1 capsule by mouth daily.   Yes [provider]  Multiple Vitamins-Minerals (DIABETES HEALTH PO) Take 1 packet by mouth daily.   Yes [provider]  Nutritional Supplements (BEE POLLEN/ROYAL JELLY/HONEY PO) Take 2 tablets by mouth daily.   Yes [provider]  Nutritional Supplements (JUICE PLUS FIBRE PO) Take 3 tablets by mouth 2 (two) times daily.   Yes [provider]  omega-3 acid ethyl esters (LOVAZA) 1 g capsule Take 1 capsule (1 g total) by mouth 2 (two) times daily. 02/22/17  Yes Roberta Angell, PA-C  Omega-3 Fatty Acids (FISH OIL PO) Take by mouth daily.   Yes [provider]  Potassium Gluconate 595 MG CAPS Take by mouth.   Yes [provider]    Turmeric 500 MG CAPS Take 1 capsule by mouth 2 (two) times daily.   Yes [provider]  DULoxetine (CYMBALTA) 30 MG capsule Take 1 capsule (30 mg total) by mouth daily. Patient not taking: Reported on 03/09/2017 02/22/17   Harrison Mons, PA-C     Allergies  Allergen Reactions  . Gabapentin     Gait instability, dizziness       Objective:  Physical Exam  Constitutional: She is oriented to person, place, and time. She appears well-developed and well-nourished. She is active and cooperative. No distress.  BP 130/90 (BP Location: Left Arm, Patient Position: Sitting, Cuff Size: Large)   Pulse 76   Temp 97.4 F (36.3 C) (Oral)   Resp 16   Ht 5' 1.5" (1.562 m)   Wt 203 lb 6.4 oz (92.3 kg)   SpO2 98%   BMI 37.81 kg/m   HENT:  Head: Normocephalic and atraumatic.  Right Ear: Hearing normal.  Left Ear: Hearing normal.  Mouth/Throat: Oropharynx is clear and moist and mucous membranes are normal. No oral lesions. Abnormal dentition. Dental caries present.  Eyes: Conjunctivae are normal. No scleral icterus.  Neck: Normal range of motion. Neck supple. No thyromegaly present.  Cardiovascular: Normal rate, regular rhythm and normal heart sounds.   Pulses:      Radial pulses are 2+ on the right side, and 2+ on the left side.  Pulmonary/Chest: Effort normal and breath sounds normal.  Lymphadenopathy:       Head (right side): No tonsillar, no preauricular, no posterior auricular and no occipital adenopathy present.       Head (left side): No tonsillar, no preauricular, no posterior auricular and no occipital adenopathy present.    She has no cervical adenopathy.       Right: No supraclavicular adenopathy present.       Left: No supraclavicular adenopathy present.  Neurological: She is alert and oriented to person, place, and time. No sensory deficit.  Skin: Skin is warm, dry and intact. No rash noted. No cyanosis or erythema. Nails show no clubbing.  Psychiatric: She has a normal  mood and affect. Her speech is normal and behavior is normal.   Diabetic Foot Exam - Simple   Simple Foot Form Diabetic Foot exam was performed with the following findings:  Yes 03/09/2017  2:05 PM  Visual Inspection No deformities, no ulcerations, no other skin breakdown bilaterally:  Yes Sensation Testing Intact to touch and monofilament testing bilaterally:  Yes Pulse Check Posterior Tibialis and Dorsalis pulse intact bilaterally:  Yes Comments Pedal pulse felt on both feet.     Wt Readings from Last 3 Encounters:  03/09/17 203 lb 6.4 oz (92.3 kg)  12/08/16 218 lb (98.9 kg)  11/25/16 220 lb 8 oz (100 kg)       Assessment & Plan:   Problem List Items Addressed This Visit  HTN (hypertension) - Primary (Chronic)    Borderline control today. Anticipate improvement as she continues to lose weight. Not interested in change in medication regimen. Would add beta blocker if remains elevated at her next visit.      Relevant Orders   CBC with Differential/Platelet (Completed)   Comprehensive metabolic panel (Completed)   DM (diabetes mellitus), type 2 ([place]) (Chronic)    Await lab results. Adjust regimen as indicated. Would increase Invokana from 100 to 300 mg if A1C >7%.      Relevant Medications   canagliflozin (INVOKANA) 100 MG TABS tablet   Other Relevant Orders   HM Diabetes Foot Exam (Completed)   Comprehensive metabolic panel (Completed)   Hemoglobin A1c (Completed)   BMI 37.0-37.9, adult    Has lost 15 lbs since her visit 12/08/16. Encouraged her efforts toward healthier eating and regular exercise.      Hyperlipidemia with target LDL less than 70    Await lab results. Continue atorvastatin. Would increase to 80 mg if LDL remains above 70.      Relevant Orders   Comprehensive metabolic panel (Completed)   Lipid panel (Completed)   Carious teeth    Proceed with dental visit and extraction. Anticipate improvement in her overall health with that.       Paresthesia    Retry gabapentin. She'll let me know if she experiences dizziness again.      Relevant Medications   gabapentin (NEURONTIN) 300 MG capsule       Return in about 3 months (around 06/09/2017) for re-evaluation of diabetes, blood pressure and cholesterol.   Fara Chute, PA-C [facility]

## 2017-03-09 NOTE — Patient Instructions (Addendum)
1. Call the neurology office and re-schedule the nerve conduction study.  2. Schedule the bone density test.  3. We recommend that you schedule a mammogram for breast cancer screening. Typically, you do not need a referral to do this. Please contact a local imaging center to schedule your mammogram.  [facility] - [phone]  *ask for the Radiology Department [facility]) - [phone] or [phone]  [facility] - [phone] [phone] [facility] - [phone]  *ask for the [facility] - [phone]  *ask for the Radiology Department [facility] - [phone]  *ask for the [place] - [phone]  IF you received an x-ray today, you will receive an invoice from [facility]. Please contact [facility] Radiology at [phone] with questions or concerns regarding your invoice.   IF you received labwork today, you will receive an invoice from [facility]. Please contact [facility] at [phone] with questions or concerns regarding your invoice.   Our billing staff will not be able to assist you with questions regarding bills from these companies.  You will be contacted with the lab results as soon as they are available. The fastest way to get your results is to activate your My Chart account. Instructions are located on the last page of this paperwork. If you have not heard from [place] regarding the results in 2 weeks, please contact this office.

## 2017-03-10 LAB — COMPREHENSIVE METABOLIC PANEL
A/G RATIO: 1.8 (ref 1.2–2.2)
ALK PHOS: 108 IU/L (ref 39–117)
ALT: 14 IU/L (ref 0–32)
AST: 14 IU/L (ref 0–40)
Albumin: 4.2 g/dL (ref 3.6–4.8)
BILIRUBIN TOTAL: 0.3 mg/dL (ref 0.0–1.2)
BUN/Creatinine Ratio: 25 (ref 12–28)
BUN: 18 mg/dL (ref 8–27)
CHLORIDE: 98 mmol/L (ref 96–106)
CO2: 22 mmol/L (ref 18–29)
Calcium: 9.5 mg/dL (ref 8.7–10.3)
Creatinine, Ser: 0.72 mg/dL (ref 0.57–1.00)
GFR calc non Af Amer: 86 mL/min/{1.73_m2} (ref >59)
GFR, EST AFRICAN AMERICAN: 100 mL/min/{1.73_m2} (ref >59)
GLUCOSE: 184 mg/dL — AB (ref 65–99)
Globulin, Total: 2.4 g/dL (ref 1.5–4.5)
POTASSIUM: 4.2 mmol/L (ref 3.5–5.2)
Sodium: 140 mmol/L (ref 134–144)
TOTAL PROTEIN: 6.6 g/dL (ref 6.0–8.5)

## 2017-03-10 LAB — CBC WITH DIFFERENTIAL/PLATELET
Basophils Absolute: 0 x10E3/uL (ref 0.0–0.2)
Basos: 0 %
EOS (ABSOLUTE): 0.2 x10E3/uL (ref 0.0–0.4)
Eos: 2 %
Hematocrit: 39.6 % (ref 34.0–46.6)
Hemoglobin: 13.2 g/dL (ref 11.1–15.9)
Immature Grans (Abs): 0 x10E3/uL (ref 0.0–0.1)
Immature Granulocytes: 0 %
Lymphocytes Absolute: 2.4 x10E3/uL (ref 0.7–3.1)
Lymphs: 25 %
MCH: 28 pg (ref 26.6–33.0)
MCHC: 33.3 g/dL (ref 31.5–35.7)
MCV: 84 fL (ref 79–97)
Monocytes Absolute: 0.4 x10E3/uL (ref 0.1–0.9)
Monocytes: 4 %
Neutrophils Absolute: 6.5 x10E3/uL (ref 1.4–7.0)
Neutrophils: 69 %
Platelets: 324 x10E3/uL (ref 150–379)
RBC: 4.71 x10E6/uL (ref 3.77–5.28)
RDW: 14.3 % (ref 12.3–15.4)
WBC: 9.5 x10E3/uL (ref 3.4–10.8)

## 2017-03-10 LAB — LIPID PANEL
CHOLESTEROL TOTAL: 128 mg/dL (ref 100–199)
Chol/HDL Ratio: 3.3 ratio (ref 0.0–4.4)
HDL: 39 mg/dL — ABNORMAL LOW (ref >39)
LDL CALC: 56 mg/dL (ref 0–99)
Triglycerides: 167 mg/dL — ABNORMAL HIGH (ref 0–149)
VLDL Cholesterol Cal: 33 mg/dL (ref 5–40)

## 2017-03-10 LAB — HEMOGLOBIN A1C
Est. average glucose Bld gHb Est-mCnc: 143 mg/dL
HEMOGLOBIN A1C: 6.6 % — AB (ref 4.8–5.6)

## 2017-03-11 NOTE — Assessment & Plan Note (Signed)
Proceed with dental visit and extraction. Anticipate improvement in her overall health with that.

## 2017-03-11 NOTE — Assessment & Plan Note (Signed)
Await lab results. Continue atorvastatin. Would increase to 80 mg if LDL remains above 70.

## 2017-03-11 NOTE — Assessment & Plan Note (Signed)
Has lost 15 lbs since her visit 12/08/16. Encouraged her efforts toward healthier eating and regular exercise.

## 2017-03-11 NOTE — Assessment & Plan Note (Signed)
Retry gabapentin. She'll let me know if she experiences dizziness again.

## 2017-03-11 NOTE — Assessment & Plan Note (Signed)
Borderline control today. Anticipate improvement as she continues to lose weight. Not interested in change in medication regimen. Would add beta blocker if remains elevated at her next visit.

## 2017-03-11 NOTE — Assessment & Plan Note (Signed)
Await lab results. Adjust regimen as indicated. Would increase Invokana from 100 to 300 mg if A1C >7%.

## 2017-03-12 ENCOUNTER — Telehealth: Payer: Self-pay | Admitting: Physician Assistant

## 2017-03-12 NOTE — Telephone Encounter (Signed)
Dose clarified. 300 mg PO QHS.

## 2017-03-18 ENCOUNTER — Ambulatory Visit (INDEPENDENT_AMBULATORY_CARE_PROVIDER_SITE_OTHER): Payer: PPO | Admitting: Neurology

## 2017-03-18 ENCOUNTER — Encounter: Payer: Self-pay | Admitting: Neurology

## 2017-03-18 VITALS — BP 132/78 | HR 80 | Resp 16 | Ht 61.5 in | Wt 202.0 lb

## 2017-03-18 DIAGNOSIS — G4719 Other hypersomnia: Secondary | ICD-10-CM | POA: Diagnosis not present

## 2017-03-18 DIAGNOSIS — R351 Nocturia: Secondary | ICD-10-CM

## 2017-03-18 DIAGNOSIS — R0681 Apnea, not elsewhere classified: Secondary | ICD-10-CM | POA: Diagnosis not present

## 2017-03-18 DIAGNOSIS — E669 Obesity, unspecified: Secondary | ICD-10-CM

## 2017-03-18 DIAGNOSIS — R0683 Snoring: Secondary | ICD-10-CM

## 2017-03-18 NOTE — Progress Notes (Signed)
Subjective:    [name] is a 71 y.o. female.  HPI     Star Age, MD, PhD [facility] [address], [address] [place], [place] [address]  Dear Karina Davis,   I saw your patient, Karina Davis, upon your kind request in my clinic today for initial consultation of her sleep disorder, in particular, concern for underlying obstructive sleep apnea. The patient is unaccompanied today. As you know, Karina Davis is a 71 year old right-handed woman with an underlying medical history of hypertension, diabetes, paresthesias and morbid obesity, who reports snoring and excessive daytime somnolence. I reviewed your office note from 11/25/2016. Her Epworth sleepiness score is 18 out of 24, fatigue score is 58 out of 63. She reports loud snoring and  waking up with a sense of gasping. She has been working on weight loss and her symptoms have improved a little bit since she has lost weight. She lives with her husband. She has 2 grown children. She is a nonsmoker. She drinks alcohol infrequently. She drinks caffeine in the form of coffee, 2 cups in the mornings typically. 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 Bedtime varies. She is generally in bed between 11 and midnight. Wakeup time also varies, most typically between 8 and 9 AM. She has nocturia usually once per average night, usually in the very early morning. She denies recurrent morning headaches. She had pneumonia in November 2017 and stayed with her daughter noticed loud snoring and pauses in her breathing. She has also been told by her husband and friends that she had loud snoring and pauses in her breathing. She started working on weight loss some 6 months ago and has lost about 40 pounds so far. She denies restless leg symptoms but has had neuropathy symptoms, tried Cymbalta and had side effects, is back on gabapentin. She is scheduled for EMG and nerve conduction testing in  June.  Her Past Medical History Is Significant For: Past Medical History:  Diagnosis Date  . Ankle fracture   . Diabetes mellitus without complication ([place])   . Hypertension   . Neuropathy   . Obesity     Her Past Surgical History Is Significant For: Past Surgical History:  Procedure Laterality Date  . APPENDECTOMY  1955   childhood  . COLON SURGERY      Her Family History Is Significant For: Family History  Problem Relation Age of Onset  . Cancer Mother 14       Colon  . Colon cancer Mother 21  . Stroke Father 1  . Diabetes Father   . Heart disease Father   . Hypertension Father   . Hypertension Sister   . Diabetes Sister   . Kidney disease Sister        due to HTN and DM  . Hypertension Brother   . Stroke Brother   . Hypertension Daughter   . Diabetes Daughter   . Obesity Daughter        gastric sleeve surgery 03/05/2014  . Hypertension Sister   . Drug abuse Sister        narcotic abuse  . Stroke Sister        associated with narcotic abuse  . Diabetes Sister   . Hypertension Sister   . Hyperlipidemia Sister   . Hypertension Brother   . Drug abuse Son        cocaine  . Stroke Brother   .  Hypertension Brother     Her Social History Is Significant For: Social History   Social History  . Marital status: Married    Spouse name: Karina Davis, Sr.  . Number of children: 2  . Years of education: 12+   Occupational History  . Realtor    Social History Main Topics  . Smoking status: Never Smoker  . Smokeless tobacco: Never Used  . Alcohol use Yes     Comment: Socially  . Drug use: No  . Sexual activity: Not Currently   Other Topics Concern  . None   Social History Narrative   Lives with her husband.  Her adult children live locally.      College education, works as a Forensic psychologist.      Right-handed   Caffeine: 1-2 cups coffee and one soda per day    Her Allergies Are:  Allergies  Allergen Reactions  . Gabapentin     Gait  instability, dizziness  :   Her Current Medications Are:  Outpatient Encounter Prescriptions as of 03/18/2017  Medication Sig  . Ascorbic Acid (VITAMIN C) 1000 MG tablet Take 1,000 mg by mouth daily.  Marland Kitchen aspirin 81 MG tablet Take 81 mg by mouth daily.  Marland Kitchen atorvastatin (LIPITOR) 40 MG tablet Take 1 tablet (40 mg total) by mouth daily.  . Calcium Carb-Cholecalciferol (CALCIUM 600-D PO) Take by mouth.  . canagliflozin (INVOKANA) 100 MG TABS tablet Take 1 tablet (100 mg total) by mouth daily before breakfast.  . chlorhexidine gluconate, MEDLINE KIT, (PERIDEX) 0.12 % solution Use as directed 15 mLs in the mouth or throat 2 (two) times daily.  . Cholecalciferol (VITAMIN D) 2000 units CAPS Take 1 capsule by mouth daily.  . Coenzyme Q10 200 MG capsule Take 200 mg by mouth daily.  Marland Kitchen gabapentin (NEURONTIN) 300 MG capsule Take 1 capsule (300 mg total) by mouth at bedtime.  . hydrochlorothiazide (HYDRODIURIL) 25 MG tablet Take 1 tablet (25 mg total) by mouth daily with breakfast.  . losartan (COZAAR) 100 MG tablet Take 1 tablet (100 mg total) by mouth daily.  . magnesium oxide (MAG-OX) 400 MG tablet Take 400 mg by mouth daily.  . metFORMIN (GLUCOPHAGE) 500 MG tablet Take 2 tablets (1,000 mg total) by mouth 2 (two) times daily with a meal.  . Misc Natural Products (TART CHERRY ADVANCED) CAPS Take 1 capsule by mouth daily.  . Multiple Vitamins-Minerals (DIABETES HEALTH PO) Take 1 packet by mouth daily.  . Nutritional Supplements (BEE POLLEN/ROYAL JELLY/HONEY PO) Take 2 tablets by mouth daily.  . Nutritional Supplements (JUICE PLUS FIBRE PO) Take 3 tablets by mouth 2 (two) times daily.  Marland Kitchen omega-3 acid ethyl esters (LOVAZA) 1 g capsule Take 1 capsule (1 g total) by mouth 2 (two) times daily.  . Omega-3 Fatty Acids (FISH OIL PO) Take by mouth daily.  . Potassium Gluconate 595 MG CAPS Take by mouth.  . Turmeric 500 MG CAPS Take 1 capsule by mouth 2 (two) times daily.   No facility-administered encounter  medications on file as of 03/18/2017.   :  Review of Systems:  Out of a complete 14 point review of systems, all are reviewed and negative with the exception of these symptoms as listed below:  Review of Systems  Neurological:       Patient states that she has trouble falling asleep, wakes up many times in the nights, wakes up feeling tired, daytime fatigue, occasionally takes naps. Witnessed apnea, snores.    Epworth  Sleepiness Scale 0= would never doze 1= slight chance of dozing 2= moderate chance of dozing 3= high chance of dozing  Sitting and reading:3 Watching TV:3 Sitting inactive in a public place (ex. Theater or meeting):2 As a passenger in a car for an hour without a break:3 Lying down to rest in the afternoon:3 Sitting and talking to someone:2 Sitting quietly after lunch (no alcohol):2 In a car, while stopped in traffic:0 Total:18  Objective:  Neurologic Exam  Physical Exam Physical Examination:   Vitals:   03/18/17 1111  BP: 132/78  Pulse: 80  Resp: 16   General Examination: The patient is a very pleasant 71 y.o. female in no acute distress. She appears well-developed and well-nourished and well groomed.   HEENT: Normocephalic, atraumatic, pupils are equal, round and reactive to light and accommodation. Cataracts bilaterally. Extraocular tracking is good without limitation to gaze excursion or nystagmus noted. Normal smooth pursuit is noted. Hearing is grossly intact. Face is symmetric with normal facial animation and normal facial sensation. Speech is clear with no dysarthria noted. There is no hypophonia. There is no lip, neck/head, jaw or voice tremor. Neck is supple with full range of passive and active motion. There are no carotid bruits on auscultation. Oropharynx exam reveals: moderate mouth dryness, marginal dental hygiene and moderate airway crowding, due to smaller airway, tonsils in place, but not fully visualized and redundant soft palate. Mallampati is  class II. Tongue protrudes centrally and palate elevates symmetrically. Neck size is 15.75 inches. She has a Marked overbite and misaligned teeth.    Chest: Clear to auscultation without wheezing, rhonchi or crackles noted.  Heart: S1+S2+0, regular and normal without murmurs, rubs or gallops noted.   Abdomen: Soft, non-tender and non-distended with normal bowel sounds appreciated on auscultation.  Extremities: There is no pitting edema in the distal lower extremities bilaterally. Pedal pulses are intact.  Skin: Warm and dry without trophic changes noted.  Musculoskeletal: exam reveals no obvious joint deformities, tenderness or joint swelling or erythema.   Neurologically:  Mental status: The patient is awake, alert and oriented in all 4 spheres. Her immediate and remote memory, attention, language skills and fund of knowledge are appropriate. There is no evidence of aphasia, agnosia, apraxia or anomia. Speech is clear with normal prosody and enunciation. Thought process is linear. Mood is normal and affect is normal.  Cranial nerves II - XII are as described above under HEENT exam. In addition: shoulder shrug is normal with equal shoulder height noted. Motor exam: Normal bulk, strength and tone is noted. There is no drift, tremor or rebound. Romberg is negative. Reflexes are 1+ in the UEs, diminished in the knees, absent in the ankles. Fine motor skills and coordination: grossly intact.  Cerebellar testing: No dysmetria or intention tremor on finger to nose testing. Heel to shin is unremarkable bilaterally. There is no truncal or gait ataxia.  Sensory exam: intact to light touch, pinprick, vibration, temperature sense in the upper extremities, but decreased to all modalities in the lower extremities up to mid shin areas.  Gait, station and balance: She stands easily. No veering to one side is noted. No leaning to one side is noted. Posture is age-appropriate and stance is narrow based. Gait  shows normal stride length and normal pace. No problems turning are noted. Tandem walk is difficult for her.   Assessment and Plan:  In summary, Karina Davis is a very pleasant 71 y.o.-year old female  with an underlying medical history of hypertension,  diabetes, paresthesias and morbid obesity, whose history and physical exam are concerning for obstructive sleep apnea (OSA). I had a long chat with the patient about my findings and the diagnosis of OSA, its prognosis and treatment options. We talked about medical treatments, surgical interventions and non-pharmacological approaches. I explained in particular the risks and ramifications of untreated moderate to severe OSA, especially with respect to developing cardiovascular disease down the Road, including congestive heart failure, difficult to treat hypertension, cardiac arrhythmias, or stroke. Even type 2 diabetes has, in part, been linked to untreated OSA. Symptoms of untreated OSA include daytime sleepiness, memory problems, mood irritability and mood disorder such as depression and anxiety, lack of energy, as well as recurrent headaches, especially morning headaches. We talked about trying to maintain a healthy lifestyle in general, as well as the importance of weight control. I encouraged the patient to eat healthy, exercise daily and keep well hydrated, to keep a scheduled bedtime and wake time routine, to not skip any meals and eat healthy snacks in between meals. I advised the patient not to drive when feeling sleepy. I recommended the following at this time: sleep study with potential positive airway pressure titration. (We will score hypopneas at 4%).   I explained the sleep test procedure to the patient and also outlined possible surgical and non-surgical treatment options of OSA, including the use of a custom-made dental device (which would require a referral to a specialist dentist or oral surgeon), upper airway surgical options, such as pillar  implants, radiofrequency surgery, tongue base surgery, and UPPP (which would involve a referral to an ENT surgeon). Rarely, jaw surgery such as mandibular advancement may be considered.  I also explained the CPAP treatment option to the patient, who indicated that she would be willing to try CPAP if the need arises. I explained the importance of being compliant with PAP treatment, not only for insurance purposes but primarily to improve Her symptoms, and for the patient's long term health benefit, including to reduce Her cardiovascular risks. I answered all her questions today and the patient was in agreement. I would like to see her back after the sleep study is completed and encouraged her to call with any interim questions, concerns, problems or updates.   Thank you very much for allowing me to participate in the care of this nice patient. If I can be of any further assistance to you please do not hesitate to talk to me.  Sincerely,   Star Age, MD, PhD

## 2017-03-18 NOTE — Patient Instructions (Signed)

## 2017-03-30 ENCOUNTER — Encounter: Payer: Self-pay | Admitting: Neurology

## 2017-03-30 ENCOUNTER — Ambulatory Visit (INDEPENDENT_AMBULATORY_CARE_PROVIDER_SITE_OTHER): Payer: Self-pay | Admitting: Neurology

## 2017-03-30 ENCOUNTER — Ambulatory Visit (INDEPENDENT_AMBULATORY_CARE_PROVIDER_SITE_OTHER): Payer: PPO | Admitting: Neurology

## 2017-03-30 DIAGNOSIS — M545 Low back pain: Secondary | ICD-10-CM | POA: Diagnosis not present

## 2017-03-30 DIAGNOSIS — G8929 Other chronic pain: Secondary | ICD-10-CM

## 2017-03-30 DIAGNOSIS — E1140 Type 2 diabetes mellitus with diabetic neuropathy, unspecified: Secondary | ICD-10-CM

## 2017-03-30 DIAGNOSIS — E1142 Type 2 diabetes mellitus with diabetic polyneuropathy: Secondary | ICD-10-CM

## 2017-03-30 DIAGNOSIS — R202 Paresthesia of skin: Secondary | ICD-10-CM

## 2017-03-30 HISTORY — DX: Type 2 diabetes mellitus with diabetic neuropathy, unspecified: E11.40

## 2017-03-30 NOTE — Progress Notes (Signed)
The patient comes in today for EMG and nerve conduction study evaluation. She has a history of chronic low back pain without radiation down the legs, she is having some gait instability. She has a history of diabetes.  EMG nerve conduction studies show evidence of a mild peripheral neuropathy. This would not be severe enough to result in a significant gait disorder. Given the history of low back pain, we will get MRI of the low back.  The patient could not tolerate Cymbalta, she is back on gabapentin taking 2 capsules in the evening.

## 2017-03-30 NOTE — Progress Notes (Signed)
Please refer to EMG and nerve conduction study procedure note. 

## 2017-03-30 NOTE — Procedures (Signed)
     HISTORY:  Karina Davis is a 71 year old patient with a history of diabetes and numbness of the feet, she reports some gait instability with occasional falls. The patient is being evaluated for a possible neuropathy. The patient also reports chronic low back pain without radiation down the legs.  NERVE CONDUCTION STUDIES:  Nerve conduction studies were performed on the left upper extremity. The distal motor latencies and motor amplitudes for the median and ulnar nerves were within normal limits. The nerve conduction velocities for these nerves were also normal. The sensory latencies for the median and ulnar nerves were normal. The F wave latency for the left ulnar nerve was normal.  Nerve conduction studies were performed on both lower extremities. The distal motor latencies for the peroneal and posterior tibial nerves were normal bilaterally with low motor amplitudes for the right peroneal nerve and for the posterior tibial nerves bilaterally. The nerve conduction velocities for the peroneal and posterior tibial nerves were normal bilaterally. The sensory latencies for the sural nerves were normal bilaterally but unobtainable for the peroneal nerves bilaterally. The posterior tibial F wave latencies were within normal limits bilaterally.  EMG STUDIES:  EMG study was performed on the left lower extremity:  The tibialis anterior muscle reveals 2 to 4K motor units with full recruitment. No fibrillations or positive waves were seen. The peroneus tertius muscle reveals 2 to 4K motor units with full recruitment. No fibrillations or positive waves were seen. The medial gastrocnemius muscle reveals 1 to 3K motor units with full recruitment. No fibrillations or positive waves were seen. The vastus lateralis muscle reveals 2 to 4K motor units with full recruitment. No fibrillations or positive waves were seen. The iliopsoas muscle reveals 2 to 4K motor units with full recruitment. No fibrillations or  positive waves were seen. The biceps femoris muscle (long head) reveals 2 to 4K motor units with full recruitment. No fibrillations or positive waves were seen. The lumbosacral paraspinal muscles were tested at 3 levels, and revealed no abnormalities of insertional activity at all 3 levels tested. There was good relaxation.   IMPRESSION:  Nerve conduction studies done on the left upper extremity and both lower extremities shows findings that are consistent with a mild primarily axonal peripheral neuropathy. EMG evaluation of the left lower extremity was unremarkable, without evidence of an overlying lumbosacral radiculopathy.  Jill Alexanders MD 03/30/2017 2:00 PM  [facility] [address] [place], [place] [address]  Phone [phone] Fax [phone]

## 2017-04-11 ENCOUNTER — Ambulatory Visit
Admission: RE | Admit: 2017-04-11 | Discharge: 2017-04-11 | Disposition: A | Discharge status: Home / Self Care | Payer: PPO | Source: Ambulatory Visit | Attending: Neurology | Admitting: Neurology

## 2017-04-11 DIAGNOSIS — M545 Low back pain: Secondary | ICD-10-CM | POA: Diagnosis not present

## 2017-04-11 DIAGNOSIS — G8929 Other chronic pain: Secondary | ICD-10-CM | POA: Diagnosis not present

## 2017-04-11 DIAGNOSIS — M48061 Spinal stenosis, lumbar region without neurogenic claudication: Secondary | ICD-10-CM | POA: Diagnosis not present

## 2017-04-12 ENCOUNTER — Telehealth: Payer: Self-pay | Admitting: Neurology

## 2017-04-12 MED ORDER — CARBAMAZEPINE 100 MG PO CHEW
50.0000 mg | CHEWABLE_TABLET | Freq: Two times a day (BID) | ORAL | 1 refills | Status: DC
Start: 2017-04-12 — End: 2017-09-03

## 2017-04-12 NOTE — Telephone Encounter (Signed)
  I called patient. Unable to leave a message, MRI of the low back to show potential for nerve root impingement bilaterally at L5 level, not sure this would cause a significant gait problem but could lead to back pain and leg discomfort.  I will call back later.  MRI lumbar 04/11/17:   IMPRESSION:  This MRI of the lumbar spine without contrast shows the following: 1.    At L4-L5, there is moderate spinal stenosis due to combination of minimal anterolisthesis, severe facet hypertrophy, mild disc bulging and mild ligamentum flavum hypertrophy on the left. There is severe right and moderately severe left lateral recess stenosis that could lead to compression of either of the L5 nerve roots. 2.   There are milder degenerative changes at the other lumbar levels as detailed above with less potential for nerve root impingement.

## 2017-04-12 NOTE — Telephone Encounter (Signed)
Pt called back, she has been made aware that you will try her later.  She has cleared her voicemail in the even that you call her again and are unable to reach.

## 2017-04-12 NOTE — Telephone Encounter (Signed)
I called the patient. The patient is having numbness and lancinating pains in the feet, this is likely related to the peripheral neuropathy. I did discuss the results of the MRI of the low back, nothing that should impair balance but the patient may have some impingement of the L5 nerve roots bilaterally.  The patient will go on carbamazepine 100 mg chewable tablets taking one half tablet twice daily. The patient will call for any dose adjustments.

## 2017-04-12 NOTE — Addendum Note (Signed)
Addended by: Kathrynn Ducking on: 04/12/2017 12:59 PM   Modules accepted: Orders

## 2017-04-25 ENCOUNTER — Ambulatory Visit (INDEPENDENT_AMBULATORY_CARE_PROVIDER_SITE_OTHER): Payer: PPO | Admitting: Neurology

## 2017-04-25 DIAGNOSIS — G4733 Obstructive sleep apnea (adult) (pediatric): Secondary | ICD-10-CM

## 2017-04-25 DIAGNOSIS — G4761 Periodic limb movement disorder: Secondary | ICD-10-CM

## 2017-04-25 DIAGNOSIS — G4720 Circadian rhythm sleep disorder, unspecified type: Secondary | ICD-10-CM

## 2017-05-03 ENCOUNTER — Telehealth: Payer: Self-pay

## 2017-05-03 ENCOUNTER — Encounter: Payer: Self-pay | Admitting: Physician Assistant

## 2017-05-03 DIAGNOSIS — G4733 Obstructive sleep apnea (adult) (pediatric): Secondary | ICD-10-CM | POA: Insufficient documentation

## 2017-05-03 NOTE — Procedures (Signed)
PATIENT'S NAME:  Karina Davis, Karina Davis DOB:      03-23-1946      MR#:    [id number]     DATE OF RECORDING: 04/25/2017 REFERRING M.D.:  Harrison Mons PA-C Study Performed:   Baseline Polysomnogram HISTORY: 71 year old right-handed woman with an underlying medical history of hypertension, diabetes, paresthesias and morbid obesity, who reports snoring and excessive daytime somnolence. The patient endorsed the Epworth Sleepiness Scale at 18 points. The patient's weight 202 pounds with a height of 61.5 (inches), resulting in a BMI of 38.3 kg/m2. The patient's neck circumference measured 15.8 inches.  CURRENT MEDICATIONS: vitamin C, Lipitor, Calcium, Invokana, Peridex, Vitamin D, Co-Q 10, Neurontin, Hydrodiuril, Cozaar, Mag0ox, Glucophage, Omega 3, Potassium, Turmeric   PROCEDURE:  This is a multichannel digital polysomnogram utilizing the Somnostar 11.2 system.  Electrodes and sensors were applied and monitored per AASM Specifications.   EEG, EOG, Chin and Limb EMG, were sampled at 200 Hz.  ECG, Snore and Nasal Pressure, Thermal Airflow, Respiratory Effort, CPAP Flow and Pressure, Oximetry was sampled at 50 Hz. Digital video and audio were recorded.      BASELINE STUDY  Lights Out was at 22:47 and Lights On at 05:14.  Total recording time (TRT) was 388 minutes, with a total sleep time (TST) of  328 minutes.   The patient's sleep latency was 54.5 minutes, which is delayed. REM latency was 104.5 minutes, which is normal. The sleep efficiency was 84.5 %.     SLEEP ARCHITECTURE: WASO (Wake after sleep onset) was 25 minutes with mild sleep fragmentation noted.  There were 7 minutes in Stage N1, 223.5 minutes Stage N2, 38.5 minutes Stage N3 and 59 minutes in Stage REM.  The percentage of Stage N1 was 2.1%, Stage N2 was 68.1%, which is increased, Stage N3 was 11.7% and Stage R (REM sleep) was 18.%, which is near normal.  The arousals were noted as: 5 were spontaneous, 8 were associated with PLMs, 198 were associated  with respiratory events.    Audio and video analysis did not show any abnormal or unusual movements, behaviors, phonations or vocalizations. The patient took no bathroom breaks. Moderate to loud snoring was noted. The EKG was in keeping with normal sinus rhythm (NSR).  RESPIRATORY ANALYSIS:  There were a total of 198 respiratory events:  27 obstructive apneas, 0 central apneas and 0 mixed apneas with a total of 27 apneas and an apnea index (AI) of 4.9 /hour. There were 171 hypopneas with a hypopnea index of 31.3 /hour. The patient also had 0 respiratory event related arousals (RERAs).      The total APNEA/HYPOPNEA INDEX (AHI) was 36.2/hour and the total RESPIRATORY DISTURBANCE INDEX was 36.2 /hour.  46 events occurred in REM sleep and 280 events in NREM. The REM AHI was 46.8 /hour, versus a non-REM AHI of 33.9. The patient spent 139 minutes of total sleep time in the supine position and 189 minutes in non-supine.. The supine AHI was 63.9 versus a non-supine AHI of 15.8.  OXYGEN SATURATION & C02:  The Wake baseline 02 saturation was 96%, with the lowest being 72%. Time spent below 89% saturation equaled 215 minutes.  PERIODIC LIMB MOVEMENTS: The patient had a total of 164 Periodic Limb Movements.  The Periodic Limb Movement (PLM) index was 30 and the PLM Arousal index was 1.5/hour.  Post-study, the patient indicated that sleep was worse than usual.   IMPRESSION:  1. Obstructive Sleep Apnea (OSA) 2. Periodic Limb Movement Disorder (PLMD) 3. Dysfunctions associated with  sleep stages or arousal from sleep  RECOMMENDATIONS:  1. This study demonstrates severe obstructive sleep apnea, with a total AHI of 36.2/hour, REM AHI of 46.8/hour, supine AHI of 63.9/hour and O2 nadir of 72%. Treatment with positive airway pressure in the form of CPAP is recommended. This will require a full night titration study to optimize therapy. Other treatment options for OSA may, generally speaking, include avoidance of  supine sleep position along with weight loss, upper airway or jaw surgery in selected patients or the use of an oral appliance in certain patients. ENT evaluation and/or consultation with a maxillofacial surgeon or dentist may be feasible in some instances.    2. Please note that untreated obstructive sleep apnea carries additional perioperative morbidity. Patients with significant obstructive sleep apnea should receive perioperative PAP therapy and the surgeons and particularly the anesthesiologist should be informed of the diagnosis and the severity of the sleep disordered breathing. 3. This study shows sleep fragmentation and abnormal sleep stage percentages; these are nonspecific findings and per se do not signify an intrinsic sleep disorder or a cause for the patient's sleep-related symptoms. Causes include (but are not limited to) the first night effect of the sleep study, circadian rhythm disturbances, medication effect or an underlying mood disorder or medical problem.  4. Moderate PLMs (periodic limb movements of sleep) were noted during this study with no significant arousals; clinical correlation is recommended.  5. The patient will be seen in follow-up by Dr. Rexene Alberts at [facility] for discussion of the test results and further management strategies. The referring provider will be notified of the test results.  I certify that I have reviewed the entire raw data recording prior to the issuance of this report in accordance with the Standards of Accreditation of the American Academy of Sleep Medicine (AASM)   Star Age, MD, PhD Diplomat, American Board of Psychiatry and Neurology (Neurology and Sleep Medicine)

## 2017-05-03 NOTE — Addendum Note (Signed)
Addended by: Star Age on: 05/03/2017 08:12 AM   Modules accepted: Orders

## 2017-05-03 NOTE — Telephone Encounter (Signed)
I called pt. I advised pt that Dr. Rexene Alberts reviewed their sleep study results and found that pt has severe osa. Dr. Rexene Alberts recommends that pt return for a repeat sleep study in order to properly titrate the cpap and ensure a good mask fit. Pt is agreeable to returning for a titration study. I advised pt that our sleep lab will file with pt's insurance and call pt to schedule the sleep study when we hear back from the pt's insurance regarding coverage of this sleep study. Pt has concerns regarding sleeping with a mask over her nose and face due to her claustrophobia. Pt would like me to let the sleep lab know about this claustrophobia. Pt verbalized understanding of results. Pt had no questions at this time but was encouraged to call back if questions arise.

## 2017-05-03 NOTE — Progress Notes (Signed)
Patient referred by Dr. Jannifer Franklin, seen by me on 03/18/17, diagnostic PSG on 04/25/17.    Please call and notify the patient that the recent sleep study did confirm the diagnosis of severe obstructive sleep apnea and that I recommend treatment for this in the form of CPAP. This will require a repeat sleep study for proper titration and mask fitting. Please explain to patient and arrange for a CPAP titration study. I have placed an order in the chart. Thanks, and please route to [facility] for scheduling next sleep study.  Star Age, MD, PhD [facility])

## 2017-05-03 NOTE — Telephone Encounter (Signed)
Left message for patient to call me back if she thought coming in during day to try a mask would help her with cpap.

## 2017-05-03 NOTE — Telephone Encounter (Signed)
-----   Message from Star Age, MD sent at 05/03/2017  8:12 AM EDT ----- Patient referred by Dr. Jannifer Franklin, seen by me on 03/18/17, diagnostic PSG on 04/25/17.    Please call and notify the patient that the recent sleep study did confirm the diagnosis of severe obstructive sleep apnea and that I recommend treatment for this in the form of CPAP. This will require a repeat sleep study for proper titration and mask fitting. Please explain to patient and arrange for a CPAP titration study. I have placed an order in the chart. Thanks, and please route to [facility] for scheduling next sleep study.  Star Age, MD, PhD [facility])

## 2017-05-08 ENCOUNTER — Other Ambulatory Visit: Payer: Self-pay | Admitting: Physician Assistant

## 2017-05-08 DIAGNOSIS — R202 Paresthesia of skin: Secondary | ICD-10-CM

## 2017-05-10 NOTE — Telephone Encounter (Signed)
Pt need a refill on Gabapentin please send to [facility] pt is completely out

## 2017-05-16 ENCOUNTER — Ambulatory Visit (INDEPENDENT_AMBULATORY_CARE_PROVIDER_SITE_OTHER): Payer: PPO | Admitting: Neurology

## 2017-05-16 DIAGNOSIS — G4733 Obstructive sleep apnea (adult) (pediatric): Secondary | ICD-10-CM

## 2017-05-16 DIAGNOSIS — G4720 Circadian rhythm sleep disorder, unspecified type: Secondary | ICD-10-CM

## 2017-05-18 ENCOUNTER — Telehealth: Payer: Self-pay

## 2017-05-18 NOTE — Telephone Encounter (Signed)
-----   Message from Star Age, MD sent at 05/18/2017  7:46 AM EDT ----- Patient referred by Dr. Jannifer Franklin, seen by me on 03/18/17, diagnostic PSG on 04/25/17, CPAP titration on 05/16/17.    Please call and inform patient that I have entered an order for treatment with positive airway pressure (PAP) treatment of obstructive sleep apnea (OSA). She did well during the latest sleep study with CPAP. We will, therefore, arrange for a machine for home use through a DME (durable medical equipment) company of Her choice; and I will see the patient back in follow-up in about 10 weeks; can see MM or CM for timing. Please also explain to the patient that I will be looking out for compliance data, which can be downloaded from the machine (stored on an SD card, that is inserted in the machine) or via remote access through a modem, that is built into the machine. At the time of the followup appointment we will discuss sleep study results and how it is going with PAP treatment at home. Please advise patient to bring Her machine at the time of the first FU visit, even though this is cumbersome. Bringing the machine for every visit after that will likely not be needed, but often helps for the first visit to troubleshoot if needed. Please re-enforce the importance of compliance with treatment and the need for [place] to monitor compliance data - often an insurance requirement and actually good feedback for the patient as far as how they are doing.  Also remind patient, that any interim PAP machine or mask issues should be first addressed with the DME company, as they can often help better with technical and mask fit issues. Please ask if patient has a preference regarding DME company.  Please also make sure, the patient has a follow-up appointment with me or NP in about 10 weeks from the setup date, thanks.  Once you have spoken to the patient - and faxed/routed report to PCP and referring MD (if other than PCP), you can close this  encounter, thanks,   Star Age, MD, PhD [facility] ([place])

## 2017-05-18 NOTE — Progress Notes (Signed)
Patient referred by Dr. Jannifer Franklin, seen by me on 03/18/17, diagnostic PSG on 04/25/17, CPAP titration on 05/16/17.    Please call and inform patient that I have entered an order for treatment with positive airway pressure (PAP) treatment of obstructive sleep apnea (OSA). She did well during the latest sleep study with CPAP. We will, therefore, arrange for a machine for home use through a DME (durable medical equipment) company of Her choice; and I will see the patient back in follow-up in about 10 weeks; can see MM or CM for timing. Please also explain to the patient that I will be looking out for compliance data, which can be downloaded from the machine (stored on an SD card, that is inserted in the machine) or via remote access through a modem, that is built into the machine. At the time of the followup appointment we will discuss sleep study results and how it is going with PAP treatment at home. Please advise patient to bring Her machine at the time of the first FU visit, even though this is cumbersome. Bringing the machine for every visit after that will likely not be needed, but often helps for the first visit to troubleshoot if needed. Please re-enforce the importance of compliance with treatment and the need for [place] to monitor compliance data - often an insurance requirement and actually good feedback for the patient as far as how they are doing.  Also remind patient, that any interim PAP machine or mask issues should be first addressed with the DME company, as they can often help better with technical and mask fit issues. Please ask if patient has a preference regarding DME company.  Please also make sure, the patient has a follow-up appointment with me or NP in about 10 weeks from the setup date, thanks.  Once you have spoken to the patient - and faxed/routed report to PCP and referring MD (if other than PCP), you can close this encounter, thanks,   Star Age, MD, PhD [facility]  ([place])

## 2017-05-18 NOTE — Addendum Note (Signed)
Addended by: Star Age on: 05/18/2017 07:46 AM   Modules accepted: Orders

## 2017-05-18 NOTE — Procedures (Signed)
PATIENT'S NAME:  Karina Davis, Karina Davis DOB:      April 15, 1946      MR#:    [id number]     DATE OF RECORDING: 05/16/2017 REFERRING M.D.:  Harrison Mons PA-C Study Performed:   CPAP  Titration HISTORY: 71 year old right-handed woman with an underlying medical history of hypertension, diabetes, paresthesias and morbid obesity, who presents for a full night CPAP titration to treat her OSA. The patient had a baseline PSG on 04/25/17, which showed severe obstructive sleep apnea, with a total AHI of 36.2/hour, REM AHI of 46.8/hour, supine AHI of 63.9/hour and O2 nadir of 72%. The patient endorsed the Epworth Sleepiness Scale at 18/24 points. The patient's weight 203 pounds with a height of 61 (inches), resulting in a BMI of 38.3 kg/m2. The patient's neck circumference measured 15 inches.  CURRENT MEDICATIONS: vitamin C, Lipitor, Calcium, Invokana, Peridex, Vitamin D, Co-Q 10, Neurontin, Hydrodiuril, Cozaar, Mag0ox, Glucophage, Omega 3, Potassium, Turmeric.  PROCEDURE:  This is a multichannel digital polysomnogram utilizing the SomnoStar 11.2 system.  Electrodes and sensors were applied and monitored per AASM Specifications.   EEG, EOG, Chin and Limb EMG, were sampled at 200 Hz.  ECG, Snore and Nasal Pressure, Thermal Airflow, Respiratory Effort, CPAP Flow and Pressure, Oximetry was sampled at 50 Hz. Digital video and audio were recorded.      The patient was fitted with medium P10 nasal pillows. CPAP was initiated at 5 cmH20 with heated humidity per AASM standards and pressure was advanced to 12 cmH20 with EPR because of hypopneas, apneas and desaturations.  At a PAP pressure of 12 cmH20, there was a reduction of the AHI to 0 with non-supine NREM sleep achieved and O2 nadir of 93%.     Lights Out was at 22:38 and Lights On at 05:00. Total recording time (TRT) was 382.5 minutes, with a total sleep time (TST) of 253 minutes. The patient's sleep latency was 94 minutes, which is markedly delayed. REM latency was 181 minutes,  which is delayed. The sleep efficiency was 66.1 %, which is reduced.    SLEEP ARCHITECTURE: WASO (Wake after sleep onset)  was 53 minutes with moderate sleep fragmentation noted.  There were 11.5 minutes in Stage N1, 230 minutes Stage N2, 0 minutes Stage N3 and 11.5 minutes in Stage REM.  The percentage of Stage N1 was 4.5%, Stage N2 was 90.9%, which is markedly increased, Stage N3 was absent and stage R (REM sleep) was 4.5%, which is markedly reduced. The arousals were noted as: 21 were spontaneous, 0 were associated with PLMs, 69 were associated with respiratory events.  Audio and video analysis did not show any abnormal or unusual movements, behaviors, phonations or vocalizations. The patient took no bathroom breaks. The EKG was in keeping with normal sinus rhythm (NSR).  RESPIRATORY ANALYSIS:  There was a total of 70 respiratory events: 0 obstructive apneas, 0 central apneas and 0 mixed apneas with a total of 0 apneas and an apnea index (AI) of 0 /hour. There were 70 hypopneas with a hypopnea index of 16.6/hour. The patient also had 0 respiratory event related arousals (RERAs).      The total APNEA/HYPOPNEA INDEX  (AHI) was 16.6 /hour and the total RESPIRATORY DISTURBANCE INDEX was 16.6 .hour  0 events occurred in REM sleep and 70 events in NREM. The REM AHI was 0 /hour versus a non-REM AHI of 17.4 /hour.  The patient spent 11.5 minutes of total sleep time in the supine position and 242 minutes in non-supine. The  supine AHI was 73.0, versus a non-supine AHI of 13.9.  OXYGEN SATURATION & C02:  The baseline 02 saturation was 96%, with the lowest being 83%. Time spent below 89% saturation equaled 28 minutes.  PERIODIC LIMB MOVEMENTS: The patient had a total of 11 Periodic Limb Movements. The Periodic Limb Movement (PLM) index was 2.6 and the PLM Arousal index was 0 /hour.  Post-study, the patient indicated that sleep was worse than usual.   DIAGNOSIS 1. Obstructive Sleep Apnea (OSA)   2. Dysfunctions associated with Sleep stages or arousal from sleep    PLANS/RECOMMENDATIONS: 1. This study demonstrates significant improvement of the patient's obstructive sleep apnea with CPAP therapy. I will, therefore, start the patient on home CPAP treatment at a pressure of 12 cm via medium nasal pillows with heated humidity. The patient should be reminded to be fully compliant with PAP therapy to improve sleep related symptoms and decrease long term cardiovascular risks. The patient should be reminded, that it may take up to 3 months to get fully used to using PAP with all planned sleep. The earlier full compliance is achieved, the better long term compliance tends to be. Please note that untreated obstructive sleep apnea carries additional perioperative morbidity. Patients with significant obstructive sleep apnea should receive perioperative PAP therapy and the surgeons and particularly the anesthesiologist should be informed of the diagnosis and the severity of the sleep disordered breathing. 2. This study shows sleep fragmentation and abnormal sleep stage percentages; these are nonspecific findings and per se do not signify an intrinsic sleep disorder or a cause for the patient's sleep-related symptoms. Causes include (but are not limited to) the first night effect of the sleep study, circadian rhythm disturbances, medication effect or an underlying mood disorder or medical problem.   3. The patient should be cautioned not to drive, work at heights, or operate dangerous or heavy equipment when tired or sleepy. Review and reiteration of good sleep hygiene measures should be pursued with any patient. 4. The patient will be seen in follow-up by Dr. Rexene Alberts at [facility] for discussion of the test results and further management strategies. The referring provider will be notified of the test results.  I certify that I have reviewed the entire raw data recording prior to the issuance of this report in  accordance with the Standards of Accreditation of the American Academy of Sleep Medicine (AASM)   Star Age, MD, PhD Diplomat, American Board of Psychiatry and Neurology (Neurology and Sleep Medicine)

## 2017-05-18 NOTE — Telephone Encounter (Signed)
I called pt. I advised pt that Dr. Rexene Alberts reviewed their sleep study results and found that pt did well with her cpap. Dr. Rexene Alberts recommends that pt start a cpap at home. I reviewed PAP compliance expectations with the pt. Pt is agreeable to starting a CPAP. I advised pt that an order will be sent to a DME, [facility], and [facility] will call the pt within about one week after they file with the pt's insurance. [facility] will show the pt how to use the machine, fit for masks, and troubleshoot the CPAP if needed. A follow up appt was made for insurance purposes with Dr. Rexene Alberts on Tuesday, October 2nd, 2018 at 1:00pm. Pt verbalized understanding to arrive 15 minutes early and bring their CPAP. A letter with all of this information in it will be mailed to the pt as a reminder. I verified with the pt that the address we have on file is correct. Pt verbalized understanding of results. Pt had no questions at this time but was encouraged to call back if questions arise.

## 2017-05-27 ENCOUNTER — Ambulatory Visit: Payer: BLUE CROSS/BLUE SHIELD | Admitting: Neurology

## 2017-05-27 ENCOUNTER — Telehealth: Payer: Self-pay | Admitting: Neurology

## 2017-05-27 NOTE — Telephone Encounter (Signed)
This is the third no-show within the last 6 months for this patient.  We will discharge her from our practice.

## 2017-05-28 ENCOUNTER — Encounter: Payer: Self-pay | Admitting: Neurology

## 2017-06-15 ENCOUNTER — Encounter: Payer: Self-pay | Admitting: Physician Assistant

## 2017-06-15 ENCOUNTER — Ambulatory Visit (INDEPENDENT_AMBULATORY_CARE_PROVIDER_SITE_OTHER): Payer: PPO | Admitting: Physician Assistant

## 2017-06-15 VITALS — BP 114/70 | HR 73 | Temp 98.6°F | Resp 18 | Ht 61.5 in | Wt 206.0 lb

## 2017-06-15 DIAGNOSIS — R202 Paresthesia of skin: Secondary | ICD-10-CM

## 2017-06-15 DIAGNOSIS — G4733 Obstructive sleep apnea (adult) (pediatric): Secondary | ICD-10-CM

## 2017-06-15 DIAGNOSIS — E785 Hyperlipidemia, unspecified: Secondary | ICD-10-CM

## 2017-06-15 DIAGNOSIS — I10 Essential (primary) hypertension: Secondary | ICD-10-CM | POA: Diagnosis not present

## 2017-06-15 DIAGNOSIS — Z1231 Encounter for screening mammogram for malignant neoplasm of breast: Secondary | ICD-10-CM

## 2017-06-15 DIAGNOSIS — E2839 Other primary ovarian failure: Secondary | ICD-10-CM | POA: Diagnosis not present

## 2017-06-15 DIAGNOSIS — E1142 Type 2 diabetes mellitus with diabetic polyneuropathy: Secondary | ICD-10-CM

## 2017-06-15 DIAGNOSIS — Z6837 Body mass index (BMI) 37.0-37.9, adult: Secondary | ICD-10-CM

## 2017-06-15 DIAGNOSIS — E1121 Type 2 diabetes mellitus with diabetic nephropathy: Secondary | ICD-10-CM

## 2017-06-15 MED ORDER — LINAGLIPTIN 5 MG PO TABS: 5.0000 mg | ORAL_TABLET | Freq: Every day | ORAL | 3 refills | Status: DC

## 2017-06-15 NOTE — Progress Notes (Signed)
Patient ID: Erza Mothershead, female    DOB: 1945/12/08, 71 y.o.   MRN: [id number]  PCP: Harrison Mons, PA-C  Chief Complaint  Patient presents with  . Hypertension    pt states she hasn't been checking BPs  . Diabetes    Pt states she check her sugars when thinks about it but is aware she should be checking about twice a day.  . Follow-up    Subjective:   Presents for evaluation of HTN and diabetes.  No showed x 3 for visits with Dr. Jannifer Franklin at [facility], so has been dismissed from their practice.  Did Weight Watchers with her daughter for a while. Goal weight: 150 lbs. Estimates 40 lb loss so far, since started trying to lose.  Back on gabapentin, higher dose. It's helping with her sleep. Hands are tingling some now, too, along with her feet. She did not tolerate duloxetine. Is tolerating carbamazepine well.  Sleep study revealed OSA. She gets her CPAP set up on 06/23/2017.  Is interested in changing from [place] to [place]. She's heard the bad press about Invokana, and her sister is doing well on the [place].  Review of Systems As above. Denies chest pain, shortness of breath, HA, dizziness, vision change, nausea, vomiting, diarrhea, constipation, melena, hematochezia, dysuria, increased urinary urgency or frequency, increased hunger or thirst, unintentional weight change, unexplained myalgias or arthralgias, rash.     Patient Active Problem List   Diagnosis Date Noted  . OSA (obstructive sleep apnea) 05/03/2017  . Diabetic neuropathy ([place]) 03/30/2017  . Paresthesia 11/25/2016  . Carious teeth 01/04/2014  . HTN (hypertension) 06/29/2013  . DM (diabetes mellitus), type 2 ([place]) 06/29/2013  . Hyperlipidemia with target LDL less than 70 06/29/2013  . BMI 37.0-37.9, adult      Prior to Admission medications   Medication Sig Start Date End Date Taking? Authorizing Provider  Ascorbic Acid (VITAMIN C) 1000 MG tablet Take 1,000 mg by mouth daily.   Yes [provider]  aspirin 81 MG tablet Take 81 mg by mouth daily.   Yes [provider]  atorvastatin (LIPITOR) 40 MG tablet Take 1 tablet (40 mg total) by mouth daily. 02/22/17  Yes Justino Boze, PA-C  Calcium Carb-Cholecalciferol (CALCIUM 600-D PO) Take by mouth.   Yes [provider]  canagliflozin (INVOKANA) 100 MG TABS tablet Take 1 tablet (100 mg total) by mouth daily before breakfast. 03/09/17  Yes Latrell Reitan, PA-C  carbamazepine (TEGRETOL) 100 MG chewable tablet Chew 0.5 tablets (50 mg total) by mouth 2 (two) times daily. 04/12/17  Yes Kathrynn Ducking, MD  chlorhexidine gluconate, MEDLINE KIT, (PERIDEX) 0.12 % solution Use as directed 15 mLs in the mouth or throat 2 (two) times daily. 02/22/17  Yes Ebony Rickel, PA-C  Cholecalciferol (VITAMIN D) 2000 units CAPS Take 1 capsule by mouth daily.   Yes [provider]  Coenzyme Q10 200 MG capsule Take 200 mg by mouth daily.   Yes [provider]  gabapentin (NEURONTIN) 300 MG capsule TAKE 2 CAPSULES BY MOUTH AT BEDTIME. 05/11/17  Yes Tamberlyn Midgley, PA-C  hydrochlorothiazide (HYDRODIURIL) 25 MG tablet Take 1 tablet (25 mg total) by mouth daily with breakfast. 02/22/17  Yes Rose Hegner, PA-C  losartan (COZAAR) 100 MG tablet Take 1 tablet (100 mg total) by mouth daily. 02/22/17  Yes Tobe Kervin, PA-C  magnesium oxide (MAG-OX) 400 MG tablet Take 400 mg by mouth daily.   Yes [provider]  metFORMIN (GLUCOPHAGE) 500 MG  tablet Take 2 tablets (1,000 mg total) by mouth 2 (two) times daily with a meal. 02/22/17  Yes Edilberto Roosevelt, PA-C  Misc Natural Products (TART CHERRY ADVANCED) CAPS Take 1 capsule by mouth daily.   Yes [provider]  Multiple Vitamins-Minerals (DIABETES HEALTH PO) Take 1 packet by mouth daily.   Yes [provider]  Nutritional Supplements (BEE POLLEN/ROYAL JELLY/HONEY PO) Take 2 tablets by mouth daily.   Yes [provider]  Nutritional  Supplements (JUICE PLUS FIBRE PO) Take 3 tablets by mouth 2 (two) times daily.   Yes [provider]  omega-3 acid ethyl esters (LOVAZA) 1 g capsule Take 1 capsule (1 g total) by mouth 2 (two) times daily. 02/22/17  Yes Cyrene Gharibian, PA-C  Omega-3 Fatty Acids (FISH OIL PO) Take by mouth daily.   Yes [provider]  Potassium Gluconate 595 MG CAPS Take by mouth.   Yes [provider]  Turmeric 500 MG CAPS Take 1 capsule by mouth 2 (two) times daily.   Yes [provider]  DULoxetine (CYMBALTA) 30 MG capsule  05/28/17   [provider]     Allergies  Allergen Reactions  . Cymbalta [Duloxetine Hcl]     Nervous  . Gabapentin     Gait instability, dizziness       Objective:  Physical Exam  Constitutional: She is oriented to person, place, and time. She appears well-developed and well-nourished. She is active and cooperative. No distress.  BP 114/70 (BP Location: Right Arm, Patient Position: Sitting, Cuff Size: Large)   Pulse 73   Temp 98.6 F (37 C) (Oral)   Resp 18   Ht 5' 1.5" (1.562 m)   Wt 206 lb (93.4 kg)   SpO2 98%   BMI 38.29 kg/m   HENT:  Head: Normocephalic and atraumatic.  Right Ear: Hearing normal.  Left Ear: Hearing normal.  Eyes: Conjunctivae are normal. No scleral icterus.  Neck: Normal range of motion. Neck supple. No thyromegaly present.  Cardiovascular: Normal rate, regular rhythm and normal heart sounds.   Pulses:      Radial pulses are 2+ on the right side, and 2+ on the left side.  Pulmonary/Chest: Effort normal and breath sounds normal.  Lymphadenopathy:       Head (right side): No tonsillar, no preauricular, no posterior auricular and no occipital adenopathy present.       Head (left side): No tonsillar, no preauricular, no posterior auricular and no occipital adenopathy present.    She has no cervical adenopathy.       Right: No supraclavicular adenopathy present.       Left: No supraclavicular adenopathy  present.  Neurological: She is alert and oriented to person, place, and time. No sensory deficit.  Skin: Skin is warm, dry and intact. No rash noted. No cyanosis or erythema. Nails show no clubbing.  Psychiatric: She has a normal mood and affect. Her speech is normal and behavior is normal.       Wt Readings from Last 3 Encounters:  06/15/17 206 lb (93.4 kg)  03/18/17 202 lb (91.6 kg)  03/09/17 203 lb 6.4 oz (92.3 kg)  11/25/16 220 lb 8 oz 07/04/16 227 lb 06/18/15 222 lb 10 oz 04/24/14 225 lb 06/29/13 240 lb 10 oz     Assessment & Plan:   Problem List Items Addressed This Visit    HTN (hypertension) (Chronic)    Well controlled on HCTZ. Not on ACEI or ARB, unclear why.  Relevant Orders   CBC with Differential/Platelet (Completed)   Comprehensive metabolic panel (Completed)   DM (diabetes mellitus), type 2 ([place]) - Primary (Chronic)    Await labs. Adjust regimen as indicated by results.       Relevant Medications   linagliptin (TRADJENTA) 5 MG TABS tablet   Other Relevant Orders   Comprehensive metabolic panel (Completed)   Hemoglobin A1c (Completed)   BMI 37.0-37.9, adult    Interested in additional weight loss. Goal weight is 150 lbs.       Relevant Orders   Amb Ref to Medical Weight Management   Hyperlipidemia with target LDL less than 70    Await labs. Adjust regimen as indicated by results. Continue statin.      Relevant Orders   Comprehensive metabolic panel (Completed)   Lipid panel (Completed)   Diabetic neuropathy ([place])    Needs controlled glucose. Intolerant to duloxetine. Retried gabapentin.      Relevant Medications   linagliptin (TRADJENTA) 5 MG TABS tablet   OSA (obstructive sleep apnea)    Needs to get CPAP titration study and set up. Has been dismissed from [facility] for 3 no shows. Will need to refer elsewhere or consider auo-titration study at home.       Other Visit Diagnoses    Estrogen deficiency       Relevant Orders   DG Bone  Density   Encounter for screening mammogram for breast cancer       Relevant Orders   MM Digital Diagnostic Bilat       Return in about 3 months (around 09/15/2017) for re-evaluation of diabetes, blood pressure.Fara Chute, PA-C [facility] [place]

## 2017-06-15 NOTE — Patient Instructions (Addendum)
REDUCE the magnesium supplement to help with the loose stools.  STOP the Invokana. START the Tradjenta.  We recommend that you schedule a mammogram for breast cancer screening. Typically, you do not need a referral to do this. Please contact a local imaging center to schedule your mammogram.  [facility] - [phone]  *ask for the Radiology Department [facility]) - [phone] or [phone]  [facility] - [phone] [place] [phone] [facility] - [phone]  *ask for the [facility] - [phone]  *ask for the Radiology Department [facility] - [phone]  *ask for the [place] - [phone]   IF you received an x-ray today, you will receive an invoice from [facility]. Please contact [facility] Radiology at [phone] with questions or concerns regarding your invoice.   IF you received labwork today, you will receive an invoice from [facility]. Please contact [facility] at [phone] with questions or concerns regarding your invoice.   Our billing staff will not be able to assist you with questions regarding bills from these companies.  You will be contacted with the lab results as soon as they are available. The fastest way to get your results is to activate your My Chart account. Instructions are located on the last page of this paperwork. If you have not heard from [place] regarding the results in 2 weeks, please contact this office.

## 2017-06-16 LAB — COMPREHENSIVE METABOLIC PANEL WITH GFR
ALT: 16 IU/L (ref 0–32)
AST: 18 IU/L (ref 0–40)
Albumin/Globulin Ratio: 1.8 (ref 1.2–2.2)
Albumin: 4.6 g/dL (ref 3.6–4.8)
Alkaline Phosphatase: 132 IU/L — ABNORMAL HIGH (ref 39–117)
BUN/Creatinine Ratio: 22 (ref 12–28)
BUN: 18 mg/dL (ref 8–27)
Bilirubin Total: 0.2 mg/dL (ref 0.0–1.2)
CO2: 22 mmol/L (ref 20–29)
Calcium: 10 mg/dL (ref 8.7–10.3)
Chloride: 102 mmol/L (ref 96–106)
Creatinine, Ser: 0.82 mg/dL (ref 0.57–1.00)
GFR calc Af Amer: 85 mL/min/1.73
GFR calc non Af Amer: 74 mL/min/1.73
Globulin, Total: 2.5 g/dL (ref 1.5–4.5)
Glucose: 98 mg/dL (ref 65–99)
Potassium: 4 mmol/L (ref 3.5–5.2)
Sodium: 144 mmol/L (ref 134–144)
Total Protein: 7.1 g/dL (ref 6.0–8.5)

## 2017-06-16 LAB — CBC WITH DIFFERENTIAL/PLATELET
Basophils Absolute: 0 x10E3/uL (ref 0.0–0.2)
Basos: 0 %
EOS (ABSOLUTE): 0.1 x10E3/uL (ref 0.0–0.4)
Eos: 1 %
Hematocrit: 37.8 % (ref 34.0–46.6)
Hemoglobin: 12.9 g/dL (ref 11.1–15.9)
Immature Grans (Abs): 0 x10E3/uL (ref 0.0–0.1)
Immature Granulocytes: 0 %
Lymphocytes Absolute: 2 x10E3/uL (ref 0.7–3.1)
Lymphs: 22 %
MCH: 28.5 pg (ref 26.6–33.0)
MCHC: 34.1 g/dL (ref 31.5–35.7)
MCV: 83 fL (ref 79–97)
Monocytes Absolute: 0.8 x10E3/uL (ref 0.1–0.9)
Monocytes: 9 %
Neutrophils Absolute: 6 x10E3/uL (ref 1.4–7.0)
Neutrophils: 68 %
Platelets: 277 x10E3/uL (ref 150–379)
RBC: 4.53 x10E6/uL (ref 3.77–5.28)
RDW: 15.5 % — ABNORMAL HIGH (ref 12.3–15.4)
WBC: 8.9 x10E3/uL (ref 3.4–10.8)

## 2017-06-16 LAB — LIPID PANEL
Chol/HDL Ratio: 3.5 ratio (ref 0.0–4.4)
Cholesterol, Total: 160 mg/dL (ref 100–199)
HDL: 46 mg/dL (ref >39)
LDL Calculated: 73 mg/dL (ref 0–99)
Triglycerides: 204 mg/dL — ABNORMAL HIGH (ref 0–149)
VLDL Cholesterol Cal: 41 mg/dL — ABNORMAL HIGH (ref 5–40)

## 2017-06-16 LAB — HEMOGLOBIN A1C
ESTIMATED AVERAGE GLUCOSE: 137 mg/dL
HEMOGLOBIN A1C: 6.4 % — AB (ref 4.8–5.6)

## 2017-06-20 NOTE — Assessment & Plan Note (Signed)
Well controlled on HCTZ. Not on ACEI or ARB, unclear why.

## 2017-06-20 NOTE — Assessment & Plan Note (Signed)
Await labs. Adjust regimen as indicated by results. Continue statin.

## 2017-06-20 NOTE — Assessment & Plan Note (Signed)
Interested in additional weight loss. Goal weight is 150 lbs.

## 2017-06-20 NOTE — Assessment & Plan Note (Signed)
Needs to get CPAP titration study and set up. Has been dismissed from [facility] for 3 no shows. Will need to refer elsewhere or consider auo-titration study at home.

## 2017-06-20 NOTE — Assessment & Plan Note (Signed)
Needs controlled glucose. Intolerant to duloxetine. Retried gabapentin.

## 2017-06-20 NOTE — Assessment & Plan Note (Signed)
Await labs. Adjust regimen as indicated by results.  

## 2017-06-22 ENCOUNTER — Telehealth: Payer: Self-pay

## 2017-06-22 NOTE — Telephone Encounter (Signed)
Received request stating that patient wants to change Invokana to Tradjenta due to side effect warning she heard about.  Filled out on cover my meds.  Key code is [id number].  Re check status in 5 days.

## 2017-06-23 DIAGNOSIS — G4733 Obstructive sleep apnea (adult) (pediatric): Secondary | ICD-10-CM | POA: Diagnosis not present

## 2017-07-02 NOTE — Telephone Encounter (Signed)
Tried to call plan at [phone] to check on status of PA.  (I was told by cover my Meds rep that this Key was from May and has already been archived.)  There was an unexpectedly high call volume when I called, so I held the line for 10 minutes and hung up.  I will call back later.

## 2017-07-05 NOTE — Telephone Encounter (Signed)
Call back to insurance company. Karina Davis is approved until 10/25/2017.  Patient aware.

## 2017-07-09 ENCOUNTER — Telehealth: Payer: Self-pay | Admitting: Physician Assistant

## 2017-07-09 NOTE — Telephone Encounter (Signed)
Form completed and placed in fax box in provider lounge.  Please advise patient that I doubt that her insurance will cover Tradjenta. It appears they will require she try their formulary alternative (likely Januvia or Onglyza). Has she tried one of these? She should contact them to determine which is the preferred alternative, and let me know.

## 2017-07-12 NOTE — Telephone Encounter (Signed)
Pt states that Rx was approved and she has already received the Rx in the mail and was given a 3 month supply and she believes she should be good until December.

## 2017-07-22 ENCOUNTER — Other Ambulatory Visit: Payer: Self-pay | Admitting: Physician Assistant

## 2017-07-22 DIAGNOSIS — Z1231 Encounter for screening mammogram for malignant neoplasm of breast: Secondary | ICD-10-CM

## 2017-07-24 DIAGNOSIS — G4733 Obstructive sleep apnea (adult) (pediatric): Secondary | ICD-10-CM | POA: Diagnosis not present

## 2017-07-27 ENCOUNTER — Ambulatory Visit: Payer: PPO | Admitting: Neurology

## 2017-08-11 ENCOUNTER — Other Ambulatory Visit: Payer: PPO

## 2017-08-11 ENCOUNTER — Ambulatory Visit: Payer: PPO

## 2017-08-23 DIAGNOSIS — G4733 Obstructive sleep apnea (adult) (pediatric): Secondary | ICD-10-CM | POA: Diagnosis not present

## 2017-09-02 ENCOUNTER — Ambulatory Visit
Admission: RE | Admit: 2017-09-02 | Discharge: 2017-09-02 | Disposition: A | Discharge status: Home / Self Care | Payer: PPO | Source: Ambulatory Visit | Attending: Physician Assistant | Admitting: Physician Assistant

## 2017-09-02 DIAGNOSIS — E2839 Other primary ovarian failure: Secondary | ICD-10-CM

## 2017-09-02 DIAGNOSIS — Z1231 Encounter for screening mammogram for malignant neoplasm of breast: Secondary | ICD-10-CM

## 2017-09-02 DIAGNOSIS — Z780 Asymptomatic menopausal state: Secondary | ICD-10-CM | POA: Diagnosis not present

## 2017-09-02 DIAGNOSIS — M85852 Other specified disorders of bone density and structure, left thigh: Secondary | ICD-10-CM | POA: Diagnosis not present

## 2017-09-03 ENCOUNTER — Telehealth: Payer: Self-pay

## 2017-09-03 ENCOUNTER — Ambulatory Visit (INDEPENDENT_AMBULATORY_CARE_PROVIDER_SITE_OTHER): Payer: PPO

## 2017-09-03 ENCOUNTER — Encounter: Payer: Self-pay | Admitting: Physician Assistant

## 2017-09-03 VITALS — BP 100/72 | HR 74 | Ht 62.0 in | Wt 216.2 lb

## 2017-09-03 DIAGNOSIS — Z0000 Encounter for general adult medical examination without abnormal findings: Secondary | ICD-10-CM | POA: Diagnosis not present

## 2017-09-03 DIAGNOSIS — Z23 Encounter for immunization: Secondary | ICD-10-CM

## 2017-09-03 DIAGNOSIS — R202 Paresthesia of skin: Secondary | ICD-10-CM

## 2017-09-03 DIAGNOSIS — M8580 Other specified disorders of bone density and structure, unspecified site: Secondary | ICD-10-CM | POA: Insufficient documentation

## 2017-09-03 MED ORDER — GABAPENTIN 300 MG PO CAPS: 600.0000 mg | ORAL_CAPSULE | Freq: Every day | ORAL | 3 refills | Status: DC

## 2017-09-03 NOTE — Patient Instructions (Addendum)
Karina Davis , Thank you for taking time to come for your Medicare Wellness Visit. I appreciate your ongoing commitment to your health goals. Please review the following plan we discussed and let me know if I can assist you in the future.   Screening recommendations/referrals: Colonoscopy: up to date, next due 02/15/2024 Mammogram: up to date, next due 09/03/2019 Bone Density: up to date, next due 09/02/2022 Recommended yearly ophthalmology/optometry visit for glaucoma screening and checkup Recommended yearly dental visit for hygiene and checkup  Vaccinations: Influenza vaccine: administered today   Pneumococcal vaccine: up to date Tdap vaccine: up to date, next due 10/06/2023 Shingles vaccine: up to date    Advanced directives: Advance directive discussed with you today. I have provided a copy for you to complete at home and have notarized. Once this is complete please bring a copy in to our office so we can scan it into your chart.   Conditions/risks identified: Try to lose weight and weigh around 150 lbs in the near future.  Next appointment: 09/21/17 @ 1:40 pm with [place] 65 Years and Older, Female Preventive care refers to lifestyle choices and visits with your health care provider that can promote health and wellness. What does preventive care include?  A yearly physical exam. This is also called an annual well check.  Dental exams once or twice a year.  Routine eye exams. Ask your health care provider how often you should have your eyes checked.  Personal lifestyle choices, including:  Daily care of your teeth and gums.  Regular physical activity.  Eating a healthy diet.  Avoiding tobacco and drug use.  Limiting alcohol use.  Practicing safe sex.  Taking low-dose aspirin every day.  Taking vitamin and mineral supplements as recommended by your health care provider. What happens during an annual well check? The services and screenings done  by your health care provider during your annual well check will depend on your age, overall health, lifestyle risk factors, and family history of disease. Counseling  Your health care provider may ask you questions about your:  Alcohol use.  Tobacco use.  Drug use.  Emotional well-being.  Home and relationship well-being.  Sexual activity.  Eating habits.  History of falls.  Memory and ability to understand (cognition).  Work and work Statistician.  Reproductive health. Screening  You may have the following tests or measurements:  Height, weight, and BMI.  Blood pressure.  Lipid and cholesterol levels. These may be checked every 5 years, or more frequently if you are over 105 years old.  Skin check.  Lung cancer screening. You may have this screening every year starting at age 51 if you have a 30-pack-year history of smoking and currently smoke or have quit within the past 15 years.  Fecal occult blood test (FOBT) of the stool. You may have this test every year starting at age 52.  Flexible sigmoidoscopy or colonoscopy. You may have a sigmoidoscopy every 5 years or a colonoscopy every 10 years starting at age 26.  Hepatitis C blood test.  Hepatitis B blood test.  Sexually transmitted disease (STD) testing.  Diabetes screening. This is done by checking your blood sugar (glucose) after you have not eaten for a while (fasting). You may have this done every 1-3 years.  Bone density scan. This is done to screen for osteoporosis. You may have this done starting at age 58.  Mammogram. This may be done every 1-2 years. Talk to your health  care provider about how often you should have regular mammograms. Talk with your health care provider about your test results, treatment options, and if necessary, the need for more tests. Vaccines  Your health care provider may recommend certain vaccines, such as:  Influenza vaccine. This is recommended every year.  Tetanus,  diphtheria, and acellular pertussis (Tdap, Td) vaccine. You may need a Td booster every 10 years.  Zoster vaccine. You may need this after age 103.  Pneumococcal 13-valent conjugate (PCV13) vaccine. One dose is recommended after age 58.  Pneumococcal polysaccharide (PPSV23) vaccine. One dose is recommended after age 73. Talk to your health care provider about which screenings and vaccines you need and how often you need them. This information is not intended to replace advice given to you by your health care provider. Make sure you discuss any questions you have with your health care provider. Document Released: 11/08/2015 Document Revised: 07/01/2016 Document Reviewed: 08/13/2015 Elsevier Interactive Patient Education  2017 [place] Prevention in the Home Falls can cause injuries. They can happen to people of all ages. There are many things you can do to make your home safe and to help prevent falls. What can I do on the outside of my home?  Regularly fix the edges of walkways and driveways and fix any cracks.  Remove anything that might make you trip as you walk through a door, such as a raised step or threshold.  Trim any bushes or trees on the path to your home.  Use bright outdoor lighting.  Clear any walking paths of anything that might make someone trip, such as rocks or tools.  Regularly check to see if handrails are loose or broken. Make sure that both sides of any steps have handrails.  Any raised decks and porches should have guardrails on the edges.  Have any leaves, snow, or ice cleared regularly.  Use sand or salt on walking paths during winter.  Clean up any spills in your garage right away. This includes oil or grease spills. What can I do in the bathroom?  Use night lights.  Install grab bars by the toilet and in the tub and shower. Do not use towel bars as grab bars.  Use non-skid mats or decals in the tub or shower.  If you need to sit down in  the shower, use a plastic, non-slip stool.  Keep the floor dry. Clean up any water that spills on the floor as soon as it happens.  Remove soap buildup in the tub or shower regularly.  Attach bath mats securely with double-sided non-slip rug tape.  Do not have throw rugs and other things on the floor that can make you trip. What can I do in the bedroom?  Use night lights.  Make sure that you have a light by your bed that is easy to reach.  Do not use any sheets or blankets that are too big for your bed. They should not hang down onto the floor.  Have a firm chair that has side arms. You can use this for support while you get dressed.  Do not have throw rugs and other things on the floor that can make you trip. What can I do in the kitchen?  Clean up any spills right away.  Avoid walking on wet floors.  Keep items that you use a lot in easy-to-reach places.  If you need to reach something above you, use a strong step stool that has a grab  bar.  Keep electrical cords out of the way.  Do not use floor polish or wax that makes floors slippery. If you must use wax, use non-skid floor wax.  Do not have throw rugs and other things on the floor that can make you trip. What can I do with my stairs?  Do not leave any items on the stairs.  Make sure that there are handrails on both sides of the stairs and use them. Fix handrails that are broken or loose. Make sure that handrails are as long as the stairways.  Check any carpeting to make sure that it is firmly attached to the stairs. Fix any carpet that is loose or worn.  Avoid having throw rugs at the top or bottom of the stairs. If you do have throw rugs, attach them to the floor with carpet tape.  Make sure that you have a light switch at the top of the stairs and the bottom of the stairs. If you do not have them, ask someone to add them for you. What else can I do to help prevent falls?  Wear shoes that:  Do not have high  heels.  Have rubber bottoms.  Are comfortable and fit you well.  Are closed at the toe. Do not wear sandals.  If you use a stepladder:  Make sure that it is fully opened. Do not climb a closed stepladder.  Make sure that both sides of the stepladder are locked into place.  Ask someone to hold it for you, if possible.  Clearly mark and make sure that you can see:  Any grab bars or handrails.  First and last steps.  Where the edge of each step is.  Use tools that help you move around (mobility aids) if they are needed. These include:  Canes.  Walkers.  Scooters.  Crutches.  Turn on the lights when you go into a dark area. Replace any light bulbs as soon as they burn out.  Set up your furniture so you have a clear path. Avoid moving your furniture around.  If any of your floors are uneven, fix them.  If there are any pets around you, be aware of where they are.  Review your medicines with your doctor. Some medicines can make you feel dizzy. This can increase your chance of falling. Ask your doctor what other things that you can do to help prevent falls. This information is not intended to replace advice given to you by your health care provider. Make sure you discuss any questions you have with your health care provider. Document Released: 08/08/2009 Document Revised: 03/19/2016 Document Reviewed: 11/16/2014 Elsevier Interactive Patient Education  2017 [employer].

## 2017-09-03 NOTE — Telephone Encounter (Signed)
Patient needs a refill of her gabapentin sent to [facility]. Patient has a follow up visit scheduled on 09/21/17.

## 2017-09-03 NOTE — Progress Notes (Signed)
Subjective:   Karina Davis is a 71 y.o. female who presents for Medicare Annual (Subsequent) preventive examination.  Review of Systems:  N/A Cardiac Risk Factors include: advanced age (>89mn, >>31women);diabetes mellitus;obesity (BMI >30kg/m2);dyslipidemia;hypertension     Objective:     Vitals: BP 100/72   Pulse 74   Ht '5\' 2"'  (1.575 m)   Wt 216 lb 4 oz (98.1 kg)   BMI 39.55 kg/m   Body mass index is 39.55 kg/m.   Tobacco Social History   Tobacco Use  Smoking Status Never Smoker  Smokeless Tobacco Never Used     Counseling given: Not Answered   Past Medical History:  Diagnosis Date  . Ankle fracture   . [place]   . [place]  . Hypertension   . Neuropathy   . Obesity    Past Surgical History:  Procedure Laterality Date  . APPENDECTOMY  1955   childhood  . COLON SURGERY     Family History  Problem Relation Age of Onset  . Cancer Mother 61      Colon  . Colon cancer Mother 652 . Stroke Father 859 . Diabetes Father   . Heart disease Father   . Hypertension Father   . Hypertension Sister   . Diabetes Sister   . Kidney disease Sister        due to HTN and DM  . Hypertension Brother   . Stroke Brother   . Hypertension Daughter   . Diabetes Daughter   . Obesity Daughter        gastric sleeve surgery 03/05/2014  . Hypertension Sister   . Drug abuse Sister        narcotic abuse  . Stroke Sister        associated with narcotic abuse  . Diabetes Sister   . Hypertension Sister   . Hyperlipidemia Sister   . Hypertension Brother   . Drug abuse Son        cocaine  . Stroke Brother   . Hypertension Brother    Social History   Substance and Sexual Activity  Sexual Activity Not Currently    Outpatient Encounter Medications as of 09/03/2017  Medication Sig  . Ascorbic Acid (VITAMIN C) 1000 MG tablet Take 1,000 mg by mouth daily.  .Marland Kitchenaspirin 81 MG tablet Take 81 mg by mouth daily.  .Marland Kitchen b complex vitamins tablet Take 1 tablet daily by mouth.  . Calcium Carb-Cholecalciferol (CALCIUM 600-D PO) Take by mouth.  . chlorhexidine gluconate, MEDLINE KIT, (PERIDEX) 0.12 % solution Use as directed 15 mLs in the mouth or throat 2 (two) times daily.  . Cholecalciferol (VITAMIN D) 2000 units CAPS Take 1 capsule by mouth daily.  . Coenzyme Q10 200 MG capsule Take 200 mg by mouth daily.  . Flaxseed, Linseed, (FLAX SEED OIL PO) Take by mouth.  . gabapentin (NEURONTIN) 300 MG capsule TAKE 2 CAPSULES BY MOUTH AT BEDTIME.  . hydrochlorothiazide (HYDRODIURIL) 25 MG tablet Take 1 tablet (25 mg total) by mouth daily with breakfast.  . linagliptin (TRADJENTA) 5 MG TABS tablet Take 1 tablet (5 mg total) by mouth daily.  .Marland Kitchenlosartan (COZAAR) 100 MG tablet Take 1 tablet (100 mg total) by mouth daily.  . magnesium oxide (MAG-OX) 400 MG tablet Take 400 mg by mouth daily.  . metFORMIN (GLUCOPHAGE) 500 MG tablet Take 2 tablets (1,000 mg total) by mouth 2 (two) times daily with a meal.  .  Misc Natural Products (TART CHERRY ADVANCED) CAPS Take 1 capsule by mouth daily.  . Multiple Vitamins-Minerals (DIABETES HEALTH PO) Take 1 packet by mouth daily.  . Nutritional Supplements (BEE POLLEN/ROYAL JELLY/HONEY PO) Take 2 tablets by mouth daily.  . Nutritional Supplements (JUICE PLUS FIBRE PO) Take 3 tablets by mouth 2 (two) times daily.  Marland Kitchen omega-3 acid ethyl esters (LOVAZA) 1 g capsule Take 1 capsule (1 g total) by mouth 2 (two) times daily.  . Omega-3 Fatty Acids (FISH OIL PO) Take by mouth daily.  . Potassium Gluconate 595 MG CAPS Take by mouth.  . Turmeric 500 MG CAPS Take 1 capsule by mouth 2 (two) times daily.  . [DISCONTINUED] canagliflozin (INVOKANA) 100 MG TABS tablet Take 1 tablet (100 mg total) by mouth daily before breakfast.  . [DISCONTINUED] carbamazepine (TEGRETOL) 100 MG chewable tablet Chew 0.5 tablets (50 mg total) by mouth 2 (two) times daily.  Marland Kitchen atorvastatin (LIPITOR) 40 MG tablet Take 1 tablet  (40 mg total) by mouth daily. (Patient not taking: Reported on 09/03/2017)   No facility-administered encounter medications on file as of 09/03/2017.     Activities of Daily Living In your present state of health, do you have any difficulty performing the following activities: 09/03/2017  Hearing? N  Vision? N  Difficulty concentrating or making decisions? N  Walking or climbing stairs? Y  Comment Has to hold on to rails and gets tired  Dressing or bathing? N  Doing errands, shopping? N  Preparing Food and eating ? N  Using the Toilet? N  In the past six months, have you accidently leaked urine? Y  Comment Wears a pad daily for urine leakage  Do you have problems with loss of bowel control? N  Managing your Medications? N  Managing your Finances? N  Housekeeping or managing your Housekeeping? N  Some recent data might be hidden    Patient Care Team: Harrison Mons, PA-C as PCP - General (Physician Assistant) Kathrynn Ducking, MD as Consulting Physician (Neurology) Star Age, MD as Attending Physician (Sleep Medicine)    Assessment:     Exercise Activities and Dietary recommendations Current Exercise Habits: Home exercise routine, Type of exercise: walking, Time (Minutes): 30, Frequency (Times/Week): 7, Weekly Exercise (Minutes/Week): 210, Intensity: Mild, Exercise limited by: None identified  Goals    . Weight (lb) < 150 lb (68 kg)     Patient wants to lose weight and weigh around 150 lbs in the near future.       Fall Risk Fall Risk  09/03/2017 06/15/2017 03/09/2017 12/08/2016 10/15/2016  Falls in the past year? Yes Yes Yes No No  Comment - - Fell in bath tub without injury. - -  Number falls in past yr: 1 1 - - -  Injury with Fall? No No - - -  Risk for fall due to : Other (Comment) - - - -  Risk for fall due to: Comment Patient slipped in the bathtub. - - - -  Follow up Falls prevention discussed Falls evaluation completed - - -   Depression Screen PHQ 2/9 Scores  09/03/2017 06/15/2017 03/09/2017 12/08/2016  PHQ - 2 Score 0 0 0 0     Cognitive Function     6CIT Screen 09/03/2017  What Year? 0 points  What month? 0 points  What time? 0 points  Count back from 20 0 points  Months in reverse 0 points  Repeat phrase 0 points  Total Score 0    Immunization  History  Administered Date(s) Administered  . Hepatitis B, adult 01/04/2014  . Influenza,inj,Quad PF,6+ Mos 10/05/2013, 07/04/2016, 09/03/2017  . Pneumococcal Conjugate-13 07/04/2016  . Pneumococcal Polysaccharide-23 10/05/2013  . Tdap 10/05/2013  . Zoster Recombinat (Shingrix) 04/26/2017   Screening Tests Health Maintenance  Topic Date Due  . OPHTHALMOLOGY EXAM  07/04/2017  . HEMOGLOBIN A1C  12/16/2017  . FOOT EXAM  03/09/2018  . COLONOSCOPY  02/15/2019  . MAMMOGRAM  09/03/2019  . TETANUS/TDAP  10/06/2023  . INFLUENZA VACCINE  Completed  . DEXA SCAN  Completed  . Hepatitis C Screening  Completed  . PNA vac Low Risk Adult  Completed      Plan:   I have personally reviewed and noted the following in the patient's chart:   . Medical and social history . Use of alcohol, tobacco or illicit drugs  . Current medications and supplements . Functional ability and status . Nutritional status . Physical activity . Advanced directives . List of other physicians . Hospitalizations, surgeries, and ER visits in previous 12 months . Vitals . Screenings to include cognitive, depression, and falls . Referrals and appointments  In addition, I have reviewed and discussed with patient certain preventive protocols, quality metrics, and best practice recommendations. A written personalized care plan for preventive services as well as general preventive health recommendations were provided to patient.   1. Encounter for Medicare annual wellness exam  2. Need for immunization against influenza - Flu Vaccine QUAD 6+ mos IM (Fluarix)   Andrez Grime, LPN  38/12/3381

## 2017-09-03 NOTE — Telephone Encounter (Signed)
Meds ordered this encounter  Medications  . gabapentin (NEURONTIN) 300 MG capsule    Sig: Take 2 capsules (600 mg total) at bedtime by mouth.    Dispense:  180 capsule    Refill:  3    Order Specific Question:   Supervising Provider    Answer:   Brigitte Pulse, EVA N [[id number]]

## 2017-09-14 ENCOUNTER — Other Ambulatory Visit: Payer: Self-pay

## 2017-09-14 ENCOUNTER — Encounter: Payer: Self-pay | Admitting: Physician Assistant

## 2017-09-14 ENCOUNTER — Ambulatory Visit: Payer: PPO | Admitting: Physician Assistant

## 2017-09-14 VITALS — BP 138/82 | HR 82 | Temp 98.2°F | Resp 18 | Ht 62.0 in | Wt 211.6 lb

## 2017-09-14 DIAGNOSIS — M545 Low back pain, unspecified: Secondary | ICD-10-CM

## 2017-09-14 DIAGNOSIS — G8929 Other chronic pain: Secondary | ICD-10-CM

## 2017-09-14 DIAGNOSIS — K029 Dental caries, unspecified: Secondary | ICD-10-CM

## 2017-09-14 DIAGNOSIS — R300 Dysuria: Secondary | ICD-10-CM

## 2017-09-14 DIAGNOSIS — R059 Cough, unspecified: Secondary | ICD-10-CM

## 2017-09-14 DIAGNOSIS — R05 Cough: Secondary | ICD-10-CM | POA: Diagnosis not present

## 2017-09-14 DIAGNOSIS — R109 Unspecified abdominal pain: Secondary | ICD-10-CM | POA: Diagnosis not present

## 2017-09-14 DIAGNOSIS — G4733 Obstructive sleep apnea (adult) (pediatric): Secondary | ICD-10-CM

## 2017-09-14 DIAGNOSIS — M48061 Spinal stenosis, lumbar region without neurogenic claudication: Secondary | ICD-10-CM | POA: Insufficient documentation

## 2017-09-14 LAB — POCT URINALYSIS DIP (MANUAL ENTRY)
Bilirubin, UA: NEGATIVE
Blood, UA: NEGATIVE
Glucose, UA: NEGATIVE mg/dL
Ketones, POC UA: NEGATIVE mg/dL
Nitrite, UA: NEGATIVE
Protein Ur, POC: NEGATIVE mg/dL
Spec Grav, UA: >=1.03 — AB (ref 1.010–1.025)
Urobilinogen, UA: 0.2 U/dL
pH, UA: 5.5 (ref 5.0–8.0)

## 2017-09-14 MED ORDER — MELOXICAM 15 MG PO TABS: 15.0000 mg | ORAL_TABLET | Freq: Every day | ORAL | 0 refills | Status: DC

## 2017-09-14 MED ORDER — BENZONATATE 100 MG PO CAPS: 100.0000 mg | ORAL_CAPSULE | Freq: Three times a day (TID) | ORAL | 0 refills | Status: DC | PRN

## 2017-09-14 MED ORDER — AZELASTINE HCL 0.1 % NA SOLN: 2.0000 | Freq: Two times a day (BID) | NASAL | 0 refills | Status: DC

## 2017-09-14 MED ORDER — CYCLOBENZAPRINE HCL 10 MG PO TABS: 10.0000 mg | ORAL_TABLET | Freq: Every evening | ORAL | 0 refills | Status: DC | PRN

## 2017-09-14 NOTE — Assessment & Plan Note (Signed)
Dismissed from previous provider due to missed appointments, which she disputes. Needs new provider.

## 2017-09-14 NOTE — Assessment & Plan Note (Signed)
Encouraged her to explore options for dentistry. Anticipate improved glucose with improved dental care and multiple extractions.

## 2017-09-14 NOTE — Progress Notes (Signed)
[name], female    DOB: [birth year], 71 y.o.   MRN: [id number]  PCP: Harrison Mons, PA-C  Chief Complaint  Patient presents with  . Back Pain    x2 weeks, lower back and goes around on the right side.  . Cough    x2 weeks, pt states she has been coughing since she got the flu shot.     Subjective:   Presents for evaluation of back pain x 2 weeks.  Initially thought it was due to leaning across the seat to lock the passenger side door.  The pain is located on the RIGHT low back and wraps around the side.  Aleve and OTC Back Ache product without benefit. Heating pad seemed to help. Was feeling a little better yesterday and the day before, but starting hurting again this morning. No pain into the leg, hip or groin.. Worse if she sleeps on the LEFT side. No loss of bowel or bladder control other than her typical urinary stress incontinence. Had burning with urination for a couple of days, but has resolved. No hematuria, no increased urinary urgency or frequency. No history of kidney stones. She started to drink beer and cranberry juice to "flush out her kidneys," on the recommendation of her daughter-in-law, who is a Marine scientist. Is also limiting caffeine.  Cough developed shortly after flu vaccine. Had a cold before the flu vaccine, which had resolved. Sometimes productive. Nasal drainage is gray colored. No sore throat or ear pain. Had CAP this time last year. No SOB or CP. Has felt sweaty, but doesn't have a thermometer, and chills.  Stopped atorvastatin and magnesium supplement due to elevated liver enzymes and diarrhea. Diarrhea resolved.  Was dismissed from [facility] due to 3 missed appointments, though the patient disputes that. She relates that there was an insurance problem that may have resulted in her being "late" and then not seen. This is problematic as her sleep specialist is also at that practice.   Review of  Systems As above.  Patient Active Problem List   Diagnosis Date Noted  . Osteopenia 09/03/2017  . OSA (obstructive sleep apnea) 05/03/2017  . Diabetic neuropathy ([place]) 03/30/2017  . Carious teeth 01/04/2014  . HTN (hypertension) 06/29/2013  . DM (diabetes mellitus), type 2 ([place]) 06/29/2013  . Hyperlipidemia with target LDL less than 70 06/29/2013  . BMI 37.0-37.9, adult      Prior to Admission medications   Medication Sig Start Date End Date Taking? Authorizing Provider  Ascorbic Acid (VITAMIN C) 1000 MG tablet Take 1,000 mg by mouth daily.   Yes [provider]  aspirin 81 MG tablet Take 81 mg by mouth daily.   Yes [provider]  b complex vitamins tablet Take 1 tablet daily by mouth.   Yes [provider]  Calcium Carb-Cholecalciferol (CALCIUM 600-D PO) Take by mouth.   Yes [provider]  chlorhexidine gluconate, MEDLINE KIT, (PERIDEX) 0.12 % solution Use as directed 15 mLs in the mouth or throat 2 (two) times daily. 02/22/17  Yes Novelle Addair, PA-C  Cholecalciferol (VITAMIN D) 2000 units CAPS Take 1 capsule by mouth daily.   Yes [provider]  Coenzyme Q10 200 MG capsule Take 200 mg by mouth daily.   Yes [provider]  Flaxseed, Linseed, (FLAX SEED OIL PO) Take by mouth.   Yes [provider]  gabapentin (NEURONTIN) 300 MG capsule Take 2 capsules (600 mg total) at bedtime by mouth. 09/03/17  Yes Jeffery, Chelle, PA-C  hydrochlorothiazide (HYDRODIURIL) 25 MG tablet Take 1 tablet (25 mg total) by mouth daily with breakfast. 02/22/17  Yes Jeffery, Chelle, PA-C  linagliptin (TRADJENTA) 5 MG TABS tablet Take 1 tablet (5 mg total) by mouth daily. 06/15/17  Yes Jeffery, Chelle, PA-C  losartan (COZAAR) 100 MG tablet Take 1 tablet (100 mg total) by mouth daily. 02/22/17  Yes Jeffery, Chelle, PA-C  magnesium oxide (MAG-OX) 400 MG tablet Take 400 mg by mouth daily.   Yes [provider]  metFORMIN (GLUCOPHAGE) 500 MG  tablet Take 2 tablets (1,000 mg total) by mouth 2 (two) times daily with a meal. 02/22/17  Yes Jeffery, Chelle, PA-C  Misc Natural Products (TART CHERRY ADVANCED) CAPS Take 1 capsule by mouth daily.   Yes [provider]  Multiple Vitamins-Minerals (DIABETES HEALTH PO) Take 1 packet by mouth daily.   Yes [provider]  Nutritional Supplements (BEE POLLEN/ROYAL JELLY/HONEY PO) Take 2 tablets by mouth daily.   Yes [provider]  Nutritional Supplements (JUICE PLUS FIBRE PO) Take 3 tablets by mouth 2 (two) times daily.   Yes [provider]  omega-3 acid ethyl esters (LOVAZA) 1 g capsule Take 1 capsule (1 g total) by mouth 2 (two) times daily. 02/22/17  Yes Jeffery, Chelle, PA-C  Omega-3 Fatty Acids (FISH OIL PO) Take by mouth daily.   Yes [provider]  Potassium Gluconate 595 MG CAPS Take by mouth.   Yes [provider]  Turmeric 500 MG CAPS Take 1 capsule by mouth 2 (two) times daily.   Yes [provider]  atorvastatin (LIPITOR) 40 MG tablet Take 1 tablet (40 mg total) by mouth daily. Patient not taking: Reported on 09/03/2017 02/22/17   Harrison Mons, PA-C     Allergies  Allergen Reactions  . Cymbalta [Duloxetine Hcl]     Nervous  . Gabapentin     Gait instability, dizziness       Objective:  Physical Exam  Constitutional: She is oriented to person, place, and time. She appears well-developed and well-nourished. She is active and cooperative. No distress.  BP 138/82 (BP Location: Left Arm, Patient Position: Sitting, Cuff Size: Large)   Pulse 82   Temp 98.2 F (36.8 C) (Oral)   Resp 18   Ht 5' 2" (1.575 m)   Wt 211 lb 9.6 oz (96 kg)   SpO2 96%   BMI 38.70 kg/m   HENT:  Head: Normocephalic and atraumatic.  Right Ear: Hearing and external ear normal.  Left Ear: Hearing and external ear normal.  Nose: Nose normal.  Mouth/Throat: Uvula is midline, oropharynx is clear and moist and mucous membranes are normal. She  does not have dentures. No oral lesions. No trismus in the jaw. Abnormal dentition. Dental caries present. No dental abscesses, uvula swelling or lacerations.  Eyes: Conjunctivae are normal. No scleral icterus.  Neck: Normal range of motion. Neck supple. No thyromegaly present.  Cardiovascular: Normal rate, regular rhythm and normal heart sounds.  Pulses:      Radial pulses are 2+ on the right side, and 2+ on the left side.  Pulmonary/Chest: Effort normal and breath sounds normal.  Abdominal: Soft. Bowel sounds are normal. There is no tenderness. There is no rigidity, no rebound, no guarding and no CVA tenderness.  Musculoskeletal:       Back:  Lymphadenopathy:       Head (right side): No tonsillar, no preauricular, no posterior auricular and no occipital adenopathy present.  Head (left side): No tonsillar, no preauricular, no posterior auricular and no occipital adenopathy present.    She has no cervical adenopathy.       Right: No supraclavicular adenopathy present.       Left: No supraclavicular adenopathy present.  Neurological: She is alert and oriented to person, place, and time. No sensory deficit.  Skin: Skin is warm, dry and intact. No rash noted. No cyanosis or erythema. Nails show no clubbing.  Psychiatric: She has a normal mood and affect. Her speech is normal and behavior is normal.     Results for orders placed or performed in visit on 09/14/17  POCT urinalysis dipstick  Result Value Ref Range   Color, UA straw (A) yellow   Clarity, UA cloudy (A) clear   Glucose, UA negative negative mg/dL   Bilirubin, UA negative negative   Ketones, POC UA negative negative mg/dL   Spec Grav, UA >=1.030 (A) 1.010 - 1.025   Blood, UA negative negative   pH, UA 5.5 5.0 - 8.0   Protein Ur, POC negative negative mg/dL   Urobilinogen, UA 0.2 0.2 or 1.0 E.U./dL   Nitrite, UA Negative Negative   Leukocytes, UA Small (1+) (A) Negative       Assessment & Plan:   Problem List  Items Addressed This Visit    Carious teeth    Encouraged her to explore options for dentistry. Anticipate improved glucose with improved dental care and multiple extractions.      OSA (obstructive sleep apnea)    Dismissed from previous provider due to missed appointments, which she disputes. Needs new provider.      Relevant Orders   Ambulatory referral to Sleep Studies   Chronic midline low back pain without sciatica    Dismissed from previous provider due to missed appointments, which she disputes. Needs new provider.       Relevant Medications   cyclobenzaprine (FLEXERIL) 10 MG tablet   meloxicam (MOBIC) 15 MG tablet   Other Relevant Orders   Ambulatory referral to Neurology    Other Visit Diagnoses    Dysuria    -  Primary   Resolved, but persistent flank pain. Await labs.   Relevant Orders   POCT urinalysis dipstick (Completed)   Flank pain       Cannot exclude nephrolithiasis. UA reassuring. NSAID and muscle relaxer.    Relevant Medications   cyclobenzaprine (FLEXERIL) 10 MG tablet   meloxicam (MOBIC) 15 MG tablet   Other Relevant Orders   CBC with Differential/Platelet   Comprehensive metabolic panel   Urine Microscopic   Urine Culture   Cough       Normal lung exam. Treat for URI-type symptoms and supportive care.   Relevant Medications   azelastine (ASTELIN) 0.1 % nasal spray   benzonatate (TESSALON) 100 MG capsule       Return for follow-up as planned.   Fara Chute, PA-C [facility] at [place]

## 2017-09-14 NOTE — Patient Instructions (Addendum)
Please resume the Lipitor (atorvastatin) every day, best in the evening. If the diarrhea recurs, let me know.    IF you received an x-ray today, you will receive an invoice from [facility]. Please contact [facility] at [phone] with questions or concerns regarding your invoice.   IF you received labwork today, you will receive an invoice from [place]. Please contact [facility] at [phone] with questions or concerns regarding your invoice.   Our billing staff will not be able to assist you with questions regarding bills from these companies.  You will be contacted with the lab results as soon as they are available. The fastest way to get your results is to activate your My Chart account. Instructions are located on the last page of this paperwork. If you have not heard from [place] regarding the results in 2 weeks, please contact this office.

## 2017-09-15 LAB — CBC WITH DIFFERENTIAL/PLATELET
BASOS ABS: 0 10*3/uL (ref 0.0–0.2)
Basos: 0 %
EOS (ABSOLUTE): 0.2 10*3/uL (ref 0.0–0.4)
Eos: 2 %
HEMOGLOBIN: 12.2 g/dL (ref 11.1–15.9)
Hematocrit: 36.8 % (ref 34.0–46.6)
Immature Grans (Abs): 0 10*3/uL (ref 0.0–0.1)
Immature Granulocytes: 0 %
LYMPHS ABS: 2.9 10*3/uL (ref 0.7–3.1)
LYMPHS: 25 %
MCH: 28.4 pg (ref 26.6–33.0)
MCHC: 33.2 g/dL (ref 31.5–35.7)
MCV: 86 fL (ref 79–97)
MONOCYTES: 6 %
Monocytes Absolute: 0.7 10*3/uL (ref 0.1–0.9)
Neutrophils Absolute: 7.9 10*3/uL — ABNORMAL HIGH (ref 1.4–7.0)
Neutrophils: 67 %
PLATELETS: 320 10*3/uL (ref 150–379)
RBC: 4.29 x10E6/uL (ref 3.77–5.28)
RDW: 14.6 % (ref 12.3–15.4)
WBC: 11.8 10*3/uL — AB (ref 3.4–10.8)

## 2017-09-15 LAB — COMPREHENSIVE METABOLIC PANEL
ALBUMIN: 4.3 g/dL (ref 3.6–4.8)
ALK PHOS: 99 IU/L (ref 39–117)
ALT: 14 IU/L (ref 0–32)
AST: 17 IU/L (ref 0–40)
Albumin/Globulin Ratio: 1.7 (ref 1.2–2.2)
BUN / CREAT RATIO: 23 (ref 12–28)
BUN: 18 mg/dL (ref 8–27)
Bilirubin Total: 0.2 mg/dL (ref 0.0–1.2)
CO2: 24 mmol/L (ref 20–29)
CREATININE: 0.77 mg/dL (ref 0.57–1.00)
Calcium: 10 mg/dL (ref 8.7–10.3)
Chloride: 101 mmol/L (ref 96–106)
GFR calc non Af Amer: 80 mL/min/{1.73_m2} (ref >59)
GFR, EST AFRICAN AMERICAN: 92 mL/min/{1.73_m2} (ref >59)
GLUCOSE: 116 mg/dL — AB (ref 65–99)
Globulin, Total: 2.6 g/dL (ref 1.5–4.5)
Potassium: 4.4 mmol/L (ref 3.5–5.2)
Sodium: 139 mmol/L (ref 134–144)
Total Protein: 6.9 g/dL (ref 6.0–8.5)

## 2017-09-15 LAB — URINALYSIS, MICROSCOPIC ONLY: CASTS: NONE SEEN /LPF

## 2017-09-18 LAB — URINE CULTURE

## 2017-09-19 ENCOUNTER — Other Ambulatory Visit: Payer: Self-pay | Admitting: Physician Assistant

## 2017-09-19 MED ORDER — AMOXICILLIN-POT CLAVULANATE 875-125 MG PO TABS: 1.0000 | ORAL_TABLET | Freq: Two times a day (BID) | ORAL | 0 refills | Status: AC

## 2017-09-19 NOTE — Progress Notes (Signed)
Meds ordered this encounter  Medications  . amoxicillin-clavulanate (AUGMENTIN) 875-125 MG tablet    Sig: Take 1 tablet by mouth 2 (two) times daily for 10 days.    Dispense:  20 tablet    Refill:  0    Order Specific Question:   Supervising Provider    Answer:   Brigitte Pulse, EVA N [[id number]]

## 2017-09-21 ENCOUNTER — Encounter: Payer: Self-pay | Admitting: Physician Assistant

## 2017-09-21 ENCOUNTER — Ambulatory Visit: Payer: PPO | Admitting: Physician Assistant

## 2017-09-21 VITALS — BP 128/80 | HR 87 | Temp 98.3°F | Resp 18 | Ht 62.0 in | Wt 212.8 lb

## 2017-09-21 DIAGNOSIS — I10 Essential (primary) hypertension: Secondary | ICD-10-CM

## 2017-09-21 DIAGNOSIS — E785 Hyperlipidemia, unspecified: Secondary | ICD-10-CM

## 2017-09-21 DIAGNOSIS — K029 Dental caries, unspecified: Secondary | ICD-10-CM

## 2017-09-21 DIAGNOSIS — Z6837 Body mass index (BMI) 37.0-37.9, adult: Secondary | ICD-10-CM | POA: Diagnosis not present

## 2017-09-21 DIAGNOSIS — E1121 Type 2 diabetes mellitus with diabetic nephropathy: Secondary | ICD-10-CM | POA: Diagnosis not present

## 2017-09-21 NOTE — Progress Notes (Signed)
[name], female    DOB: 16-Apr-1946, 71 y.o.   MRN: [id number]  PCP: Harrison Mons, PA-C  Chief Complaint  Patient presents with  . Diabetes  . Hypertension  . Follow-up    Subjective:   Presents for evaluation of diabetes, HTN, lipids.  I saw her last week for flank pain. She ultimately had a UTI (Proteus and Group B Strep), and some calcium oxalate crystals were noted, so it's possible that she had and passed a kidney stone. She has started on the antibiotic and feels well.  Eye exam several years ago at [facility].  Will see dentist next week!  Recent DEXA revealed osteopenia. She is taking calcium and vitamin D, but isn't sure how much. Has resumed atorvastatin since her visit last week, after stopping it several months ago due to diarrhea. She had also been on magnesium, which she also stopped at the same time.  Review of Systems Mild cough. Using OTC treatment. Episodic increased hunger. Occasional watery stools. Ankle swelling when on her feet too long. Denies fever, chills, chest pain, shortness of breath, HA, dizziness, vision change, nausea, vomiting, constipation, melena, hematochezia, dysuria, increased urinary urgency or frequency, increased  thirst, unintentional weight change, unexplained myalgias or arthralgias, rash.   Patient Active Problem List   Diagnosis Date Noted  . Chronic midline low back pain without sciatica 09/14/2017  . Osteopenia 09/03/2017  . OSA (obstructive sleep apnea) 05/03/2017  . Diabetic neuropathy ([place]) 03/30/2017  . Carious teeth 01/04/2014  . HTN (hypertension) 06/29/2013  . DM (diabetes mellitus), type 2 ([place]) 06/29/2013  . Hyperlipidemia with target LDL less than 70 06/29/2013  . BMI 37.0-37.9, adult      Prior to Admission medications   Medication Sig Start Date End Date Taking? Authorizing Provider  amoxicillin-clavulanate (AUGMENTIN) 875-125 MG tablet Take 1 tablet by mouth 2 (two) times  daily for 10 days. 09/19/17 09/29/17 Yes Kenora Spayd, PA-C  Ascorbic Acid (VITAMIN C) 1000 MG tablet Take 1,000 mg by mouth daily.   Yes [provider]  aspirin 81 MG tablet Take 81 mg by mouth daily.   Yes [provider]  atorvastatin (LIPITOR) 40 MG tablet Take 1 tablet (40 mg total) by mouth daily. 02/22/17  Yes Gwyn Hieronymus, PA-C  azelastine (ASTELIN) 0.1 % nasal spray Place 2 sprays into both nostrils 2 (two) times daily. Use in each nostril as directed 09/14/17  Yes Berlin Viereck, PA-C  b complex vitamins tablet Take 1 tablet daily by mouth.   Yes [provider]  benzonatate (TESSALON) 100 MG capsule Take 1-2 capsules (100-200 mg total) by mouth 3 (three) times daily as needed for cough. 09/14/17  Yes Mylie Mccurley, PA-C  Calcium Carb-Cholecalciferol (CALCIUM 600-D PO) Take by mouth.   Yes [provider]  chlorhexidine gluconate, MEDLINE KIT, (PERIDEX) 0.12 % solution Use as directed 15 mLs in the mouth or throat 2 (two) times daily. 02/22/17  Yes Leslye Puccini, PA-C  Cholecalciferol (VITAMIN D) 2000 units CAPS Take 1 capsule by mouth daily.   Yes [provider]  Coenzyme Q10 200 MG capsule Take 200 mg by mouth daily.   Yes [provider]  cyclobenzaprine (FLEXERIL) 10 MG tablet Take 1 tablet (10 mg total) by mouth at bedtime as needed for muscle spasms. 09/14/17  Yes Nowell Sites, PA-C  Flaxseed, Linseed, (FLAX SEED OIL PO) Take by mouth.   Yes [provider]  gabapentin (NEURONTIN) 300 MG capsule Take  2 capsules (600 mg total) at bedtime by mouth. 09/03/17  Yes Asah Lamay, PA-C  hydrochlorothiazide (HYDRODIURIL) 25 MG tablet Take 1 tablet (25 mg total) by mouth daily with breakfast. 02/22/17  Yes Aldrich Lloyd, PA-C  linagliptin (TRADJENTA) 5 MG TABS tablet Take 1 tablet (5 mg total) by mouth daily. 06/15/17  Yes Vivian Okelley, PA-C  losartan (COZAAR) 100 MG tablet Take 1 tablet (100 mg total) by mouth  daily. 02/22/17  Yes Tremon Sainvil, PA-C  magnesium oxide (MAG-OX) 400 MG tablet Take 400 mg by mouth daily.   Yes [provider]  meloxicam (MOBIC) 15 MG tablet Take 1 tablet (15 mg total) by mouth daily. 09/14/17  Yes Carsen Leaf, PA-C  metFORMIN (GLUCOPHAGE) 500 MG tablet Take 2 tablets (1,000 mg total) by mouth 2 (two) times daily with a meal. 02/22/17  Yes Ambrose Wile, PA-C  Misc Natural Products (TART CHERRY ADVANCED) CAPS Take 1 capsule by mouth daily.   Yes [provider]  Multiple Vitamins-Minerals (DIABETES HEALTH PO) Take 1 packet by mouth daily.   Yes [provider]  Nutritional Supplements (BEE POLLEN/ROYAL JELLY/HONEY PO) Take 2 tablets by mouth daily.   Yes [provider]  Nutritional Supplements (JUICE PLUS FIBRE PO) Take 3 tablets by mouth 2 (two) times daily.   Yes [provider]  omega-3 acid ethyl esters (LOVAZA) 1 g capsule Take 1 capsule (1 g total) by mouth 2 (two) times daily. 02/22/17  Yes Homer Miller, PA-C  Omega-3 Fatty Acids (FISH OIL PO) Take by mouth daily.   Yes [provider]  Potassium Gluconate 595 MG CAPS Take by mouth.   Yes [provider]  Turmeric 500 MG CAPS Take 1 capsule by mouth 2 (two) times daily.   Yes [provider]     Allergies  Allergen Reactions  . Cymbalta [Duloxetine Hcl]     Nervous  . Gabapentin     Gait instability, dizziness       Objective:  Physical Exam  Constitutional: She is oriented to person, place, and time. She appears well-developed and well-nourished. She is active and cooperative. No distress.  BP 128/80 (BP Location: Left Arm, Patient Position: Sitting, Cuff Size: Large)   Pulse 87   Temp 98.3 F (36.8 C) (Oral)   Resp 18   Ht '5\' 2"'$  (1.575 m)   Wt 212 lb 12.8 oz (96.5 kg)   SpO2 97%   BMI 38.92 kg/m   HENT:  Head: Normocephalic and atraumatic.  Right Ear: Hearing normal.  Left Ear: Hearing normal.  Mouth/Throat:  Oropharynx is clear and moist and mucous membranes are normal. No oral lesions. Abnormal dentition. Dental caries present. No uvula swelling.  Eyes: Conjunctivae are normal. No scleral icterus.  Neck: Normal range of motion. Neck supple. No thyromegaly present.  Cardiovascular: Normal rate, regular rhythm and normal heart sounds.  Pulses:      Radial pulses are 2+ on the right side, and 2+ on the left side.  Pulmonary/Chest: Effort normal and breath sounds normal.  Lymphadenopathy:       Head (right side): No tonsillar, no preauricular, no posterior auricular and no occipital adenopathy present.       Head (left side): No tonsillar, no preauricular, no posterior auricular and no occipital adenopathy present.    She has no cervical adenopathy.       Right: No supraclavicular adenopathy present.       Left: No supraclavicular adenopathy present.  Neurological: She is alert  and oriented to person, place, and time. No sensory deficit.  Skin: Skin is warm, dry and intact. No rash noted. No cyanosis or erythema. Nails show no clubbing.  Psychiatric: She has a normal mood and affect. Her speech is normal and behavior is normal.        Assessment & Plan:   Problem List Items Addressed This Visit    HTN (hypertension) (Chronic)    COntrolled. Continue current treatment.      Relevant Orders   CBC with Differential/Platelet (Completed)   DM (diabetes mellitus), type 2 ([place]) - Primary (Chronic)    Await labs. Adjust regimen as indicated by results. A1C 6.4% 3 months ago.      Relevant Orders   Hemoglobin A1c (Completed)   HM DIABETES EYE EXAM (Completed)   Microalbumin / creatinine urine ratio (Completed)   BMI 37.0-37.9, adult    Encouraged healthy eating and regular exercise.      Hyperlipidemia with target LDL less than 70    Await labs. Adjust regimen as indicated by results. LDL goal <70      Relevant Orders   Lipid panel (Completed)   Carious teeth    Proceed with dental  visit next week. Anticipate full extraction and dentures.          Return in about 3 months (around 12/22/2017) for re-evalaution of diabetes, blood pressure, cholesterol.   Fara Chute, PA-C [facility] at [place]

## 2017-09-21 NOTE — Patient Instructions (Addendum)
Please schedule an eye exam, either at [facility], or  [facility]  [address] [place], [place] [address]  Phone: [phone]  [facility] [place], [place], [place] [address]  Phone: [phone]    IF you received an x-ray today, you will receive an invoice from [facility]. Please contact [facility] at [phone] with questions or concerns regarding your invoice.   IF you received labwork today, you will receive an invoice from [place]. Please contact [facility] at [phone] with questions or concerns regarding your invoice.   Our billing staff will not be able to assist you with questions regarding bills from these companies.  You will be contacted with the lab results as soon as they are available. The fastest way to get your results is to activate your My Chart account. Instructions are located on the last page of this paperwork. If you have not heard from [place] regarding the results in 2 weeks, please contact this office.

## 2017-09-22 LAB — CBC WITH DIFFERENTIAL/PLATELET
BASOS: 0 %
Basophils Absolute: 0 10*3/uL (ref 0.0–0.2)
EOS (ABSOLUTE): 0.2 10*3/uL (ref 0.0–0.4)
EOS: 2 %
HEMATOCRIT: 36 % (ref 34.0–46.6)
Hemoglobin: 12.1 g/dL (ref 11.1–15.9)
Immature Grans (Abs): 0 10*3/uL (ref 0.0–0.1)
Immature Granulocytes: 0 %
LYMPHS ABS: 2.5 10*3/uL (ref 0.7–3.1)
Lymphs: 25 %
MCH: 28.4 pg (ref 26.6–33.0)
MCHC: 33.6 g/dL (ref 31.5–35.7)
MCV: 85 fL (ref 79–97)
MONOS ABS: 0.5 10*3/uL (ref 0.1–0.9)
Monocytes: 6 %
Neutrophils Absolute: 6.6 10*3/uL (ref 1.4–7.0)
Neutrophils: 67 %
Platelets: 327 10*3/uL (ref 150–379)
RBC: 4.26 x10E6/uL (ref 3.77–5.28)
RDW: 14.5 % (ref 12.3–15.4)
WBC: 9.9 10*3/uL (ref 3.4–10.8)

## 2017-09-22 LAB — LIPID PANEL
CHOL/HDL RATIO: 2.9 ratio (ref 0.0–4.4)
Cholesterol, Total: 128 mg/dL (ref 100–199)
HDL: 44 mg/dL (ref >39)
LDL CALC: 52 mg/dL (ref 0–99)
Triglycerides: 159 mg/dL — ABNORMAL HIGH (ref 0–149)
VLDL CHOLESTEROL CAL: 32 mg/dL (ref 5–40)

## 2017-09-22 LAB — MICROALBUMIN / CREATININE URINE RATIO
CREATININE, UR: 81.7 mg/dL
MICROALBUM., U, RANDOM: 20.9 ug/mL
Microalb/Creat Ratio: 25.6 mg/g creat (ref 0.0–30.0)

## 2017-09-22 LAB — HEMOGLOBIN A1C
Est. average glucose Bld gHb Est-mCnc: 140 mg/dL
Hgb A1c MFr Bld: 6.5 % — ABNORMAL HIGH (ref 4.8–5.6)

## 2017-09-22 NOTE — Assessment & Plan Note (Signed)
Proceed with dental visit next week. Anticipate full extraction and dentures.

## 2017-09-22 NOTE — Assessment & Plan Note (Signed)
COntrolled. Continue current treatment.

## 2017-09-22 NOTE — Assessment & Plan Note (Signed)
Encouraged healthy eating and regular exercise. 

## 2017-09-22 NOTE — Assessment & Plan Note (Signed)
Await labs. Adjust regimen as indicated by results. A1C 6.4% 3 months ago.

## 2017-09-22 NOTE — Assessment & Plan Note (Signed)
Await labs. Adjust regimen as indicated by results. LDL goal <70. 

## 2017-09-23 DIAGNOSIS — G4733 Obstructive sleep apnea (adult) (pediatric): Secondary | ICD-10-CM | POA: Diagnosis not present

## 2017-10-14 ENCOUNTER — Encounter (INDEPENDENT_AMBULATORY_CARE_PROVIDER_SITE_OTHER): Payer: PPO

## 2017-10-15 ENCOUNTER — Telehealth: Payer: Self-pay | Admitting: Physician Assistant

## 2017-10-15 NOTE — Addendum Note (Signed)
Addended by: Fara Chute on: 10/15/2017 02:48 PM   Modules accepted: Orders

## 2017-10-15 NOTE — Telephone Encounter (Signed)
Cancelled neurology order. Ordered orthopedics referral.

## 2017-10-15 NOTE — Telephone Encounter (Signed)
Pt referral that was sent to [facility] they stated that do not see pt's with chronic back pain.Marland Kitchen Was wondering where else would you like patient to go for neurology? Or would you like for me to send it anywhere?

## 2017-10-23 DIAGNOSIS — G4733 Obstructive sleep apnea (adult) (pediatric): Secondary | ICD-10-CM | POA: Diagnosis not present

## 2017-11-04 ENCOUNTER — Ambulatory Visit (INDEPENDENT_AMBULATORY_CARE_PROVIDER_SITE_OTHER): Payer: PPO | Admitting: Family Medicine

## 2017-11-12 ENCOUNTER — Ambulatory Visit (INDEPENDENT_AMBULATORY_CARE_PROVIDER_SITE_OTHER): Payer: PPO | Admitting: Orthopaedic Surgery

## 2017-11-23 DIAGNOSIS — G4733 Obstructive sleep apnea (adult) (pediatric): Secondary | ICD-10-CM | POA: Diagnosis not present

## 2017-12-14 ENCOUNTER — Ambulatory Visit (INDEPENDENT_AMBULATORY_CARE_PROVIDER_SITE_OTHER): Payer: PPO | Admitting: Orthopaedic Surgery

## 2017-12-18 ENCOUNTER — Other Ambulatory Visit: Payer: Self-pay | Admitting: Physician Assistant

## 2017-12-18 DIAGNOSIS — Z9189 Other specified personal risk factors, not elsewhere classified: Secondary | ICD-10-CM

## 2017-12-20 NOTE — Telephone Encounter (Signed)
She has an appointment 3-1 ok to fill?

## 2017-12-20 NOTE — Telephone Encounter (Signed)
Chlorhexedine Gluconate refill Last OV: 09/21/17 Last Refill: 09/21/17 Pharmacy:[facility] [place], [place]

## 2017-12-21 ENCOUNTER — Other Ambulatory Visit: Payer: Self-pay | Admitting: Physician Assistant

## 2017-12-21 DIAGNOSIS — R109 Unspecified abdominal pain: Secondary | ICD-10-CM

## 2017-12-21 NOTE — Telephone Encounter (Signed)
Copied from [place]. Topic: Quick Communication - Rx Refill/Question >> Dec 21, 2017  2:38 PM Ether Griffins B wrote: Medication: metformin   hydrochlorothiazide    losartan     omega-3 acid ethyl esters      atorvastatin     flexeril     meloxicam  Pt is out of medications and thought the mail order would be quicker than they are and they stated it would take days to get these filled and mailed to her. She is requesting these go to [facility].  Has the patient contacted their pharmacy? Yes.     (Agent: If no, request that the patient contact the pharmacy for the refill.)   Preferred Pharmacy (with phone number or street name): [facility] [place], [place]   Agent: Please be advised that RX refills may take up to 3 business days. We ask that you follow-up with your pharmacy.

## 2017-12-22 ENCOUNTER — Other Ambulatory Visit: Payer: Self-pay

## 2017-12-22 DIAGNOSIS — R10A0 Flank pain, unspecified side: Secondary | ICD-10-CM

## 2017-12-22 DIAGNOSIS — I10 Essential (primary) hypertension: Secondary | ICD-10-CM

## 2017-12-22 DIAGNOSIS — R109 Unspecified abdominal pain: Secondary | ICD-10-CM

## 2017-12-22 DIAGNOSIS — E785 Hyperlipidemia, unspecified: Secondary | ICD-10-CM

## 2017-12-22 DIAGNOSIS — E1121 Type 2 diabetes mellitus with diabetic nephropathy: Secondary | ICD-10-CM

## 2017-12-22 MED ORDER — LOSARTAN POTASSIUM 100 MG PO TABS: 100.0000 mg | ORAL_TABLET | Freq: Every day | ORAL | 1 refills | Status: DC

## 2017-12-22 MED ORDER — HYDROCHLOROTHIAZIDE 25 MG PO TABS: 25.0000 mg | ORAL_TABLET | Freq: Every day | ORAL | 1 refills | Status: DC

## 2017-12-22 MED ORDER — MELOXICAM 15 MG PO TABS: 15.0000 mg | ORAL_TABLET | Freq: Every day | ORAL | 0 refills | Status: DC

## 2017-12-22 MED ORDER — OMEGA-3-ACID ETHYL ESTERS 1 G PO CAPS: 1.0000 g | ORAL_CAPSULE | Freq: Two times a day (BID) | ORAL | 0 refills | Status: AC

## 2017-12-22 MED ORDER — METFORMIN HCL 500 MG PO TABS: 1000.0000 mg | ORAL_TABLET | Freq: Two times a day (BID) | ORAL | 1 refills | Status: DC

## 2017-12-22 MED ORDER — ATORVASTATIN CALCIUM 40 MG PO TABS: 40.0000 mg | ORAL_TABLET | Freq: Every day | ORAL | 1 refills | Status: DC

## 2017-12-22 NOTE — Telephone Encounter (Signed)
LOV 09/21/17 [place]

## 2017-12-23 DIAGNOSIS — G4733 Obstructive sleep apnea (adult) (pediatric): Secondary | ICD-10-CM | POA: Diagnosis not present

## 2017-12-24 ENCOUNTER — Ambulatory Visit (INDEPENDENT_AMBULATORY_CARE_PROVIDER_SITE_OTHER): Payer: PPO

## 2017-12-24 ENCOUNTER — Encounter: Payer: Self-pay | Admitting: Physician Assistant

## 2017-12-24 ENCOUNTER — Ambulatory Visit (INDEPENDENT_AMBULATORY_CARE_PROVIDER_SITE_OTHER): Payer: PPO | Admitting: Physician Assistant

## 2017-12-24 ENCOUNTER — Other Ambulatory Visit: Payer: Self-pay

## 2017-12-24 VITALS — BP 132/82 | HR 78 | Temp 98.7°F | Resp 97 | Ht 62.0 in | Wt 219.0 lb

## 2017-12-24 DIAGNOSIS — E1121 Type 2 diabetes mellitus with diabetic nephropathy: Secondary | ICD-10-CM | POA: Diagnosis not present

## 2017-12-24 DIAGNOSIS — M7732 Calcaneal spur, left foot: Secondary | ICD-10-CM | POA: Diagnosis not present

## 2017-12-24 DIAGNOSIS — R109 Unspecified abdominal pain: Secondary | ICD-10-CM | POA: Diagnosis not present

## 2017-12-24 DIAGNOSIS — I10 Essential (primary) hypertension: Secondary | ICD-10-CM

## 2017-12-24 DIAGNOSIS — R2242 Localized swelling, mass and lump, left lower limb: Secondary | ICD-10-CM | POA: Diagnosis not present

## 2017-12-24 DIAGNOSIS — E785 Hyperlipidemia, unspecified: Secondary | ICD-10-CM

## 2017-12-24 MED ORDER — CYCLOBENZAPRINE HCL 10 MG PO TABS: 10.0000 mg | ORAL_TABLET | Freq: Every evening | ORAL | 0 refills | Status: DC | PRN

## 2017-12-24 NOTE — Progress Notes (Signed)
[name], female    DOB: July 16, 1946, 72 y.o.   MRN: [id number]  PCP: Harrison Mons, PA-C  Chief Complaint  Patient presents with  . Hyperlipidemia    follow up   . Hypertension    follow up   . Diabetes    follow up    Subjective:   Presents for evaluation of diabetes, HTN, hyperlipidema.  Chronic peripheral neuropathy is unchanged. She does not several months of a non-tender lump on the dorsum of the LEFT foot. It may have gotten smaller since she first noticed it. No trauma/injury.  Last labs 09/21/17: LDL 52 A1C 6.5% Creatinine 0.77, GFR 80  Review of Systems  Constitutional: Negative for activity change, appetite change, fatigue and unexpected weight change.  HENT: Negative for congestion, dental problem, ear pain, hearing loss, mouth sores, postnasal drip, rhinorrhea, sneezing, sore throat, tinnitus and trouble swallowing.   Eyes: Negative for photophobia, pain, redness and visual disturbance.  Respiratory: Negative for cough, chest tightness and shortness of breath.   Cardiovascular: Negative for chest pain, palpitations and leg swelling.  Gastrointestinal: Negative for abdominal pain, blood in stool, constipation, diarrhea, nausea and vomiting.  Endocrine: Negative for cold intolerance, heat intolerance, polydipsia, polyphagia and polyuria.  Genitourinary: Negative for dysuria, frequency, hematuria and urgency.  Musculoskeletal: Negative for arthralgias, gait problem, myalgias and neck stiffness.  Skin: Negative for rash.  Neurological: Positive for numbness (bilateral feet). Negative for dizziness, speech difficulty, weakness, light-headedness and headaches.  Hematological: Negative for adenopathy.  Psychiatric/Behavioral: Negative for confusion and sleep disturbance. The patient is not nervous/anxious.        Patient Active Problem List   Diagnosis Date Noted  . Chronic midline low back pain without sciatica 09/14/2017  . Osteopenia  09/03/2017  . OSA (obstructive sleep apnea) 05/03/2017  . Diabetic neuropathy ([place]) 03/30/2017  . Carious teeth 01/04/2014  . HTN (hypertension) 06/29/2013  . DM (diabetes mellitus), type 2 ([place]) 06/29/2013  . Hyperlipidemia with target LDL less than 70 06/29/2013  . BMI 37.0-37.9, adult      Prior to Admission medications   Medication Sig Start Date End Date Taking? Authorizing Provider  Ascorbic Acid (VITAMIN C) 1000 MG tablet Take 1,000 mg by mouth daily.   Yes [provider]  aspirin 81 MG tablet Take 81 mg by mouth daily.   Yes [provider]  atorvastatin (LIPITOR) 40 MG tablet Take 1 tablet (40 mg total) by mouth daily. 12/22/17  Yes Beonka Amesquita, PA-C  azelastine (ASTELIN) 0.1 % nasal spray Place 2 sprays into both nostrils 2 (two) times daily. Use in each nostril as directed 09/14/17  Yes Sanora Cunanan, PA-C  b complex vitamins tablet Take 1 tablet daily by mouth.   Yes [provider]  Calcium Carb-Cholecalciferol (CALCIUM 600-D PO) Take by mouth.   Yes [provider]  chlorhexidine (PERIDEX) 0.12 % solution USE AS DIRECTED, USE 15 MLS IN THE MOUTH OR THROAT 2 TIMES A DAY  12/20/17  Yes Anice Wilshire, PA-C  Cholecalciferol (VITAMIN D) 2000 units CAPS Take 1 capsule by mouth daily.   Yes [provider]  Coenzyme Q10 200 MG capsule Take 200 mg by mouth daily.   Yes [provider]  cyclobenzaprine (FLEXERIL) 10 MG tablet Take 1 tablet (10 mg total) by mouth at bedtime as needed for muscle spasms. 09/14/17  Yes Meara Wiechman, PA-C  Flaxseed, Linseed, (FLAX SEED OIL PO) Take by mouth.   Yes [provider]  gabapentin (NEURONTIN) 300 MG capsule Take 2 capsules (600 mg total) at bedtime by mouth. 09/03/17  Yes Abbigale Mcelhaney, PA-C  hydrochlorothiazide (HYDRODIURIL) 25 MG tablet Take 1 tablet (25 mg total) by mouth daily with breakfast. 12/22/17  Yes Shooter Tangen, PA-C  linagliptin (TRADJENTA) 5 MG TABS tablet  Take 1 tablet (5 mg total) by mouth daily. 06/15/17  Yes Taris Galindo, PA-C  losartan (COZAAR) 100 MG tablet Take 1 tablet (100 mg total) by mouth daily. 12/22/17  Yes Madelyn Tlatelpa, PA-C  magnesium oxide (MAG-OX) 400 MG tablet Take 400 mg by mouth daily.   Yes [provider]  meloxicam (MOBIC) 15 MG tablet Take 1 tablet (15 mg total) by mouth daily. 12/22/17  Yes Cinda Hara, PA-C  metFORMIN (GLUCOPHAGE) 500 MG tablet Take 2 tablets (1,000 mg total) by mouth 2 (two) times daily with a meal. 12/22/17  Yes Alonza Knisley, PA-C  Misc Natural Products (TART CHERRY ADVANCED) CAPS Take 1 capsule by mouth daily.   Yes [provider]  Multiple Vitamins-Minerals (DIABETES HEALTH PO) Take 1 packet by mouth daily.   Yes [provider]  Nutritional Supplements (BEE POLLEN/ROYAL JELLY/HONEY PO) Take 2 tablets by mouth daily.   Yes [provider]  Nutritional Supplements (JUICE PLUS FIBRE PO) Take 3 tablets by mouth 2 (two) times daily.   Yes [provider]  omega-3 acid ethyl esters (LOVAZA) 1 g capsule Take 1 capsule (1 g total) by mouth 2 (two) times daily. 12/22/17  Yes Sameka Bagent, PA-C  Omega-3 Fatty Acids (FISH OIL PO) Take by mouth daily.   Yes [provider]  Potassium Gluconate 595 MG CAPS Take by mouth.   Yes [provider]  Turmeric 500 MG CAPS Take 1 capsule by mouth 2 (two) times daily.   Yes [provider]  benzonatate (TESSALON) 100 MG capsule Take 1-2 capsules (100-200 mg total) by mouth 3 (three) times daily as needed for cough. Patient not taking: Reported on 12/24/2017 09/14/17   Harrison Mons, PA-C     Allergies  Allergen Reactions  . Cymbalta [Duloxetine Hcl]     Nervous  . Gabapentin     Gait instability, dizziness       Objective:  Physical Exam  Constitutional: She is oriented to person, place, and time. She appears well-developed and well-nourished. She is active and cooperative. No  distress.  BP 132/82   Pulse 78   Temp 98.7 F (37.1 C)   Resp (!) 97   Ht 5\' 2"  (1.575 m)   Wt 219 lb (99.3 kg)   BMI 40.06 kg/m   HENT:  Head: Normocephalic and atraumatic.  Right Ear: Hearing normal.  Left Ear: Hearing normal.  Eyes: Conjunctivae are normal. No scleral icterus.  Neck: Normal range of motion. Neck supple. No thyromegaly present.  Cardiovascular: Normal rate, regular rhythm, normal heart sounds and intact distal pulses.  Pulses:      Radial pulses are 2+ on the right side, and 2+ on the left side.  Pulmonary/Chest: Effort normal and breath sounds normal.  Lymphadenopathy:       Head (right side): No tonsillar, no preauricular, no posterior auricular and no occipital adenopathy present.       Head (left side): No tonsillar, no preauricular, no posterior auricular and no occipital adenopathy present.    She has no cervical adenopathy.       Right: No supraclavicular adenopathy present.       Left: No supraclavicular adenopathy present.  Neurological: She is alert and oriented to person, place, and time. No sensory deficit.  Skin: Skin is warm, dry and intact. No rash noted. No cyanosis or erythema. Nails show no clubbing.  Psychiatric: She has a normal mood and affect. Her speech is normal and behavior is normal.      Assessment & Plan:   Problem List Items Addressed This Visit    HTN (hypertension) (Chronic)    Controlled. Continue current treatment.      Relevant Orders   CBC with Differential/Platelet   Comprehensive metabolic panel   DM (diabetes mellitus), type 2 ([place]) - Primary (Chronic)    Has been controlled. Await A1C.      Relevant Orders   Comprehensive metabolic panel   Hemoglobin A1c   Hyperlipidemia with target LDL less than 70    Has been well controlled. Await labs.      Relevant Orders   Comprehensive metabolic panel   Lipid panel    Other Visit Diagnoses    Flank pain       Musculoskeletal. heating pad. Continue PRN  cyclobenzaprine.   Relevant Medications   cyclobenzaprine (FLEXERIL) 10 MG tablet   Mass of left foot       likely tendon, benign. Await radiographs.   Relevant Orders   DG Foot Complete Left       Return in about 3 months (around 03/26/2018) for re-evaluation of diabetes, blood pressure, cholesterol.   Fara Chute, PA-C [facility]

## 2017-12-24 NOTE — Assessment & Plan Note (Signed)
Has been well controlled. Await labs.

## 2017-12-24 NOTE — Assessment & Plan Note (Signed)
Has been controlled. Await A1C.

## 2017-12-24 NOTE — Assessment & Plan Note (Signed)
Controlled. Continue current treatment. 

## 2017-12-24 NOTE — Patient Instructions (Signed)
     IF you received an x-ray today, you will receive an invoice from [facility]. Please contact [facility] at [phone] with questions or concerns regarding your invoice.   IF you received labwork today, you will receive an invoice from [facility]. Please contact [facility] at [phone] with questions or concerns regarding your invoice.   Our billing staff will not be able to assist you with questions regarding bills from these companies.  You will be contacted with the lab results as soon as they are available. The fastest way to get your results is to activate your My Chart account. Instructions are located on the last page of this paperwork. If you have not heard from us regarding the results in 2 weeks, please contact this office.     

## 2017-12-25 LAB — COMPREHENSIVE METABOLIC PANEL
A/G RATIO: 1.6 (ref 1.2–2.2)
ALK PHOS: 108 IU/L (ref 39–117)
ALT: 15 IU/L (ref 0–32)
AST: 15 IU/L (ref 0–40)
Albumin: 4.1 g/dL (ref 3.6–4.8)
BUN/Creatinine Ratio: 23 (ref 12–28)
BUN: 19 mg/dL (ref 8–27)
Bilirubin Total: 0.3 mg/dL (ref 0.0–1.2)
CALCIUM: 10 mg/dL (ref 8.7–10.3)
CO2: 23 mmol/L (ref 20–29)
CREATININE: 0.81 mg/dL (ref 0.57–1.00)
Chloride: 102 mmol/L (ref 96–106)
GFR calc Af Amer: 86 mL/min/{1.73_m2} (ref >59)
GFR, EST NON AFRICAN AMERICAN: 74 mL/min/{1.73_m2} (ref >59)
Globulin, Total: 2.6 g/dL (ref 1.5–4.5)
Glucose: 130 mg/dL — ABNORMAL HIGH (ref 65–99)
POTASSIUM: 4.2 mmol/L (ref 3.5–5.2)
Sodium: 140 mmol/L (ref 134–144)
Total Protein: 6.7 g/dL (ref 6.0–8.5)

## 2017-12-25 LAB — CBC WITH DIFFERENTIAL/PLATELET
Basophils Absolute: 0 10*3/uL (ref 0.0–0.2)
Basos: 0 %
EOS (ABSOLUTE): 0.3 10*3/uL (ref 0.0–0.4)
Eos: 2 %
Hematocrit: 37.3 % (ref 34.0–46.6)
Hemoglobin: 12.5 g/dL (ref 11.1–15.9)
IMMATURE GRANULOCYTES: 0 %
Immature Grans (Abs): 0 10*3/uL (ref 0.0–0.1)
Lymphocytes Absolute: 3 10*3/uL (ref 0.7–3.1)
Lymphs: 29 %
MCH: 28 pg (ref 26.6–33.0)
MCHC: 33.5 g/dL (ref 31.5–35.7)
MCV: 84 fL (ref 79–97)
MONOS ABS: 0.6 10*3/uL (ref 0.1–0.9)
Monocytes: 6 %
NEUTROS PCT: 63 %
Neutrophils Absolute: 6.4 10*3/uL (ref 1.4–7.0)
PLATELETS: 280 10*3/uL (ref 150–379)
RBC: 4.46 x10E6/uL (ref 3.77–5.28)
RDW: 14.6 % (ref 12.3–15.4)
WBC: 10.3 10*3/uL (ref 3.4–10.8)

## 2017-12-25 LAB — LIPID PANEL
CHOLESTEROL TOTAL: 131 mg/dL (ref 100–199)
Chol/HDL Ratio: 3 ratio (ref 0.0–4.4)
HDL: 44 mg/dL (ref >39)
LDL CALC: 62 mg/dL (ref 0–99)
TRIGLYCERIDES: 126 mg/dL (ref 0–149)
VLDL Cholesterol Cal: 25 mg/dL (ref 5–40)

## 2017-12-25 LAB — HEMOGLOBIN A1C
ESTIMATED AVERAGE GLUCOSE: 151 mg/dL
Hgb A1c MFr Bld: 6.9 % — ABNORMAL HIGH (ref 4.8–5.6)

## 2017-12-29 ENCOUNTER — Telehealth: Payer: Self-pay | Admitting: Physician Assistant

## 2017-12-29 ENCOUNTER — Telehealth: Payer: Self-pay | Admitting: *Deleted

## 2017-12-29 NOTE — Telephone Encounter (Signed)
PA STARTED FOR CYCLOBENZAPRINE 5 BUSINESS DAYS Key: [facility]

## 2017-12-29 NOTE — Telephone Encounter (Signed)
Called and spoke with pt- rescheduled her appt to 04/05/18 with Chelle. Thanks!

## 2017-12-30 ENCOUNTER — Telehealth: Payer: Self-pay

## 2017-12-30 NOTE — Telephone Encounter (Signed)
PA for Cyclobenzaprine was started on December 29, 2017  AWAITING RESPONSE

## 2018-01-11 NOTE — Telephone Encounter (Signed)
WAS RESENT ON 3/15 PER [facility] WAITING ON APPROVAL

## 2018-01-14 NOTE — Telephone Encounter (Signed)
Coverage Determination request approved by [facility] For Cyclobenzaprine 10mg  tablet Approved 12/30/2017 through 10/15/2018

## 2018-01-18 ENCOUNTER — Encounter (INDEPENDENT_AMBULATORY_CARE_PROVIDER_SITE_OTHER): Payer: Self-pay | Admitting: Orthopaedic Surgery

## 2018-01-18 ENCOUNTER — Ambulatory Visit (INDEPENDENT_AMBULATORY_CARE_PROVIDER_SITE_OTHER): Payer: PPO | Admitting: Orthopaedic Surgery

## 2018-01-18 ENCOUNTER — Ambulatory Visit (INDEPENDENT_AMBULATORY_CARE_PROVIDER_SITE_OTHER): Payer: PPO

## 2018-01-18 VITALS — Ht 61.5 in | Wt 200.0 lb

## 2018-01-18 DIAGNOSIS — G8929 Other chronic pain: Secondary | ICD-10-CM | POA: Diagnosis not present

## 2018-01-18 DIAGNOSIS — M4317 Spondylolisthesis, lumbosacral region: Secondary | ICD-10-CM | POA: Diagnosis not present

## 2018-01-18 DIAGNOSIS — M545 Low back pain: Secondary | ICD-10-CM | POA: Diagnosis not present

## 2018-01-21 DIAGNOSIS — G4733 Obstructive sleep apnea (adult) (pediatric): Secondary | ICD-10-CM | POA: Diagnosis not present

## 2018-01-26 ENCOUNTER — Ambulatory Visit
Admission: RE | Admit: 2018-01-26 | Discharge: 2018-01-26 | Disposition: A | Discharge status: Home / Self Care | Payer: PPO | Source: Ambulatory Visit | Attending: Surgery | Admitting: Surgery

## 2018-01-26 DIAGNOSIS — G8929 Other chronic pain: Secondary | ICD-10-CM

## 2018-01-26 DIAGNOSIS — M4317 Spondylolisthesis, lumbosacral region: Secondary | ICD-10-CM

## 2018-01-26 DIAGNOSIS — M48061 Spinal stenosis, lumbar region without neurogenic claudication: Secondary | ICD-10-CM | POA: Diagnosis not present

## 2018-01-26 DIAGNOSIS — M545 Low back pain: Secondary | ICD-10-CM

## 2018-01-30 ENCOUNTER — Encounter (INDEPENDENT_AMBULATORY_CARE_PROVIDER_SITE_OTHER): Payer: Self-pay | Admitting: Orthopaedic Surgery

## 2018-01-30 NOTE — Progress Notes (Signed)
Office Visit Note   Patient: Karina Davis           Date of Birth: 06-05-1946           MRN: [id number] Visit Date: 01/18/2018              Requested by: Harrison Mons, PA-C [address] [place], [place] [address] PCP: Harrison Mons, PA-C   Assessment & Plan: Visit Diagnoses:  1. Chronic bilateral low back pain, with sciatica presence unspecified   2. Spondylolisthesis of lumbosacral region     Plan: Patient has some moderate central stenosis and moderate to severe lateral recess stenosis at L4-5 noted on MRI last year.  She states her symptoms have progressed we will obtain a new MRI scan and see her back after the scan for review.  Follow-Up Instructions: Return in about 2 weeks (around 02/01/2018) for Dr. Lorin Mercy to review MRI.   Orders:  Orders Placed This Encounter  Procedures  . XR Lumbar Spine Complete  . MR Lumbar Spine w/o contrast   No orders of the defined types were placed in this encounter.     Procedures: No procedures performed   Clinical Data: No additional findings.   Subjective: Chief Complaint  Patient presents with  . Lower Back - Pain    HPI 72 year old female seen with history of chronic low back pain without sciatica.  She has diabetes and has had chronic numbness in her feet.  Previous MRI June 2018 showed moderate stenosis at L4-5 with right severe and left moderately severe lateral recess stenosis.  Review of Systems review of systems positive for history of peripheral neuropathy related diabetes.  Hyperlipidemia, hypertension, sleep apnea, dental caries, EMGs nerve conduction velocities June 2018 consistent with peripheral neuropathy, negative for radiculopathy.  Otherwise negative as it pertains HPI.   Objective: Vital Signs: Ht 5' 1.5" (1.562 m)   Wt 200 lb (90.7 kg)   BMI 37.18 kg/m   Physical Exam  Constitutional: She is oriented to person, place, and time. She appears well-developed.  HENT:  Head: Normocephalic.  Right  Ear: External ear normal.  Left Ear: External ear normal.  Eyes: Pupils are equal, round, and reactive to light.  Neck: No tracheal deviation present. No thyromegaly present.  Cardiovascular: Normal rate.  Pulmonary/Chest: Effort normal.  Abdominal: Soft.  Neurological: She is alert and oriented to person, place, and time.  Skin: Skin is warm and dry.  Psychiatric: She has a normal mood and affect. Her behavior is normal.    Ortho Exam patient has good range of motion to her hips.  Decreased sensation feet and ankles consistent with peripheral neuropathy.  Knee and ankle jerk are intact.  Some prominence dorsally over the midfoot consistent with likely small ganglion.  Good capillary refill.  Specialty Comments:  No specialty comments available.  Imaging: No results found.   PMFS History: Patient Active Problem List   Diagnosis Date Noted  . Chronic midline low back pain without sciatica 09/14/2017  . Osteopenia 09/03/2017  . OSA (obstructive sleep apnea) 05/03/2017  . Diabetic neuropathy ([place]) 03/30/2017  . Carious teeth 01/04/2014  . HTN (hypertension) 06/29/2013  . DM (diabetes mellitus), type 2 ([place]) 06/29/2013  . Hyperlipidemia with target LDL less than 70 06/29/2013  . BMI 37.0-37.9, adult    Past Medical History:  Diagnosis Date  . Ankle fracture   . Diabetes mellitus without complication ([place])   . Diabetic neuropathy ([place]) 03/30/2017  . Hypertension   . Neuropathy   .  Obesity     Family History  Problem Relation Age of Onset  . Cancer Mother 96       Colon  . Colon cancer Mother 79  . Stroke Father 47  . Diabetes Father   . Heart disease Father   . Hypertension Father   . Hypertension Sister   . Diabetes Sister   . Kidney disease Sister        due to HTN and DM  . Hypertension Brother   . Stroke Brother   . Hypertension Daughter   . Diabetes Daughter   . Obesity Daughter        gastric sleeve surgery 03/05/2014  . Hypertension Sister   . Drug abuse  Sister        narcotic abuse  . Stroke Sister        associated with narcotic abuse  . Diabetes Sister   . Hypertension Sister   . Hyperlipidemia Sister   . Hypertension Brother   . Drug abuse Son        cocaine  . Stroke Brother   . Hypertension Brother     Past Surgical History:  Procedure Laterality Date  . APPENDECTOMY  1955   childhood  . COLON SURGERY     Social History   Occupational History  . Occupation: Realtor  Tobacco Use  . Smoking status: Never Smoker  . Smokeless tobacco: Never Used  Substance and Sexual Activity  . Alcohol use: Yes    Comment: Socially  . Drug use: No  . Sexual activity: Not Currently

## 2018-01-31 ENCOUNTER — Encounter: Payer: Self-pay | Admitting: Physician Assistant

## 2018-02-04 ENCOUNTER — Encounter (INDEPENDENT_AMBULATORY_CARE_PROVIDER_SITE_OTHER): Payer: Self-pay | Admitting: Orthopaedic Surgery

## 2018-02-04 ENCOUNTER — Ambulatory Visit (INDEPENDENT_AMBULATORY_CARE_PROVIDER_SITE_OTHER): Payer: PPO | Admitting: Orthopaedic Surgery

## 2018-02-04 VITALS — BP 140/75 | HR 86 | Ht 61.5 in | Wt 200.0 lb

## 2018-02-04 DIAGNOSIS — M48062 Spinal stenosis, lumbar region with neurogenic claudication: Secondary | ICD-10-CM

## 2018-02-04 NOTE — Progress Notes (Signed)
Office Visit Note   Patient: Karina Davis           Date of Birth: 1945-12-12           MRN: [id number] Visit Date: 02/04/2018              Requested by: Harrison Mons, PA-C [address] [place], [place] [address] PCP: Harrison Mons, PA-C   Assessment & Plan: Visit Diagnoses:  1. Spinal stenosis of lumbar region with neurogenic claudication     Plan: Patient will work on weight loss.  Currently her symptoms are not severe enough to consider operative intervention.  She has slight anterolisthesis at L4-5 but does not shift more than a millimeter on flexion-extension x-rays.  Her symptoms get worse she would be a candidate for decompression surgery at L4-5.  She is going to work on weight loss walking program consider pool therapy.  Currently she can walk 10 minutes to 15 minutes and can sit and rest and repeat.  We discussed diet regimen.  She has been able to lose weight in the past.  I plan to recheck her in 4 months.  If her symptoms get worse she will let us know.  Copy of the MRI supplied.  MRI compared to previous MRI images with patient and pathophysiology discussed.  A1c was 6.66-month ago we discussed improvement in her diabetes with continued weight loss.  Follow-Up Instructions: Return in about 4 months (around 06/06/2018).   Orders:  No orders of the defined types were placed in this encounter.  No orders of the defined types were placed in this encounter.     Procedures: No procedures performed   Clinical Data: No additional findings.   Subjective: Chief Complaint  Patient presents with  . Lower Back - Pain, Follow-up    MRI Lumbar review    HPI 72 year old female returns chronic back pain increased with activities.  Can walk 10-15 minutes, she gets better with sitting and supine position.  She denies bowel or bladder symptoms no fever or chills.  New MRI is available for evaluation of her previous moderate central stenosis and severe lateral recess  stenosis at L4-5.  Review of Systems review of systems updated unchanged from 01/18/2018 office visit other than as mentioned in HPI.   Objective: Vital Signs: BP 140/75   Pulse 86   Ht 5' 1.5" (1.562 m)   Wt 200 lb (90.7 kg)   BMI 37.18 kg/m   Physical Exam  Constitutional: She is oriented to person, place, and time. She appears well-developed.  HENT:  Head: Normocephalic.  Right Ear: External ear normal.  Left Ear: External ear normal.  Eyes: Pupils are equal, round, and reactive to light.  Neck: No tracheal deviation present. No thyromegaly present.  Cardiovascular: Normal rate.  Pulmonary/Chest: Effort normal.  Abdominal: Soft.  Neurological: She is alert and oriented to person, place, and time.  Skin: Skin is warm and dry.  Psychiatric: She has a normal mood and affect. Her behavior is normal.    Ortho Exam sensation consistent with a peripheral neuropathy.  Knee and ankle jerk are intact.  Capillary refill.  Increased BMI noted.  No rash over exposed skin good knee extension good quad strength. Specialty Comments:  No specialty comments available.  Imaging: No results found.   PMFS History: Patient Active Problem List   Diagnosis Date Noted  . Chronic midline low back pain without sciatica 09/14/2017  . Osteopenia 09/03/2017  . OSA (obstructive sleep apnea) 05/03/2017  .  Diabetic neuropathy ([place]) 03/30/2017  . Carious teeth 01/04/2014  . HTN (hypertension) 06/29/2013  . DM (diabetes mellitus), type 2 ([place]) 06/29/2013  . Hyperlipidemia with target LDL less than 70 06/29/2013  . BMI 37.0-37.9, adult    Past Medical History:  Diagnosis Date  . Ankle fracture   . Diabetes mellitus without complication ([place])   . Diabetic neuropathy ([place]) 03/30/2017  . Hypertension   . Neuropathy   . Obesity     Family History  Problem Relation Age of Onset  . Cancer Mother 29       Colon  . Colon cancer Mother 64  . Stroke Father 50  . Diabetes Father   . Heart disease  Father   . Hypertension Father   . Hypertension Sister   . Diabetes Sister   . Kidney disease Sister        due to HTN and DM  . Hypertension Brother   . Stroke Brother   . Hypertension Daughter   . Diabetes Daughter   . Obesity Daughter        gastric sleeve surgery 03/05/2014  . Hypertension Sister   . Drug abuse Sister        narcotic abuse  . Stroke Sister        associated with narcotic abuse  . Diabetes Sister   . Hypertension Sister   . Hyperlipidemia Sister   . Hypertension Brother   . Drug abuse Son        cocaine  . Stroke Brother   . Hypertension Brother     Past Surgical History:  Procedure Laterality Date  . APPENDECTOMY  1955   childhood  . COLON SURGERY     Social History   Occupational History  . Occupation: Realtor  Tobacco Use  . Smoking status: Never Smoker  . Smokeless tobacco: Never Used  Substance and Sexual Activity  . Alcohol use: Yes    Comment: Socially  . Drug use: No  . Sexual activity: Not Currently

## 2018-02-09 ENCOUNTER — Encounter: Payer: Self-pay | Admitting: Physician Assistant

## 2018-02-21 DIAGNOSIS — G4733 Obstructive sleep apnea (adult) (pediatric): Secondary | ICD-10-CM | POA: Diagnosis not present

## 2018-03-23 DIAGNOSIS — G4733 Obstructive sleep apnea (adult) (pediatric): Secondary | ICD-10-CM | POA: Diagnosis not present

## 2018-04-05 ENCOUNTER — Other Ambulatory Visit: Payer: Self-pay

## 2018-04-05 ENCOUNTER — Encounter: Payer: Self-pay | Admitting: Physician Assistant

## 2018-04-05 ENCOUNTER — Ambulatory Visit (INDEPENDENT_AMBULATORY_CARE_PROVIDER_SITE_OTHER): Payer: PPO | Admitting: Physician Assistant

## 2018-04-05 VITALS — BP 142/80 | HR 85 | Temp 98.5°F | Resp 16 | Ht 61.81 in | Wt 219.6 lb

## 2018-04-05 DIAGNOSIS — I10 Essential (primary) hypertension: Secondary | ICD-10-CM | POA: Diagnosis not present

## 2018-04-05 DIAGNOSIS — R6889 Other general symptoms and signs: Secondary | ICD-10-CM | POA: Diagnosis not present

## 2018-04-05 DIAGNOSIS — E785 Hyperlipidemia, unspecified: Secondary | ICD-10-CM | POA: Diagnosis not present

## 2018-04-05 DIAGNOSIS — E1143 Type 2 diabetes mellitus with diabetic autonomic (poly)neuropathy: Secondary | ICD-10-CM | POA: Diagnosis not present

## 2018-04-05 DIAGNOSIS — E1121 Type 2 diabetes mellitus with diabetic nephropathy: Secondary | ICD-10-CM | POA: Diagnosis not present

## 2018-04-05 DIAGNOSIS — R059 Cough, unspecified: Secondary | ICD-10-CM

## 2018-04-05 DIAGNOSIS — R05 Cough: Secondary | ICD-10-CM

## 2018-04-05 DIAGNOSIS — Z9189 Other specified personal risk factors, not elsewhere classified: Secondary | ICD-10-CM | POA: Diagnosis not present

## 2018-04-05 DIAGNOSIS — M48062 Spinal stenosis, lumbar region with neurogenic claudication: Secondary | ICD-10-CM

## 2018-04-05 MED ORDER — MELOXICAM 15 MG PO TABS: 15.0000 mg | ORAL_TABLET | Freq: Every day | ORAL | 3 refills | Status: DC

## 2018-04-05 MED ORDER — LOSARTAN POTASSIUM 100 MG PO TABS: 100.0000 mg | ORAL_TABLET | Freq: Every day | ORAL | 3 refills | Status: DC

## 2018-04-05 MED ORDER — HYDROCHLOROTHIAZIDE 25 MG PO TABS: 25.0000 mg | ORAL_TABLET | Freq: Every day | ORAL | 3 refills | Status: DC

## 2018-04-05 MED ORDER — CYCLOBENZAPRINE HCL 10 MG PO TABS: 10.0000 mg | ORAL_TABLET | Freq: Every evening | ORAL | 3 refills | Status: DC | PRN

## 2018-04-05 MED ORDER — AZELASTINE HCL 0.1 % NA SOLN: 2.0000 | Freq: Two times a day (BID) | NASAL | 3 refills | Status: DC

## 2018-04-05 MED ORDER — ATORVASTATIN CALCIUM 40 MG PO TABS: 40.0000 mg | ORAL_TABLET | Freq: Every day | ORAL | 3 refills | Status: DC

## 2018-04-05 MED ORDER — LINAGLIPTIN 5 MG PO TABS: 5.0000 mg | ORAL_TABLET | Freq: Every day | ORAL | 3 refills | Status: DC

## 2018-04-05 MED ORDER — CHLORHEXIDINE GLUCONATE 0.12 % MT SOLN: OROMUCOSAL | 99 refills | Status: DC

## 2018-04-05 MED ORDER — METFORMIN HCL 500 MG PO TABS: 1000.0000 mg | ORAL_TABLET | Freq: Two times a day (BID) | ORAL | 3 refills | Status: DC

## 2018-04-05 MED ORDER — GABAPENTIN 600 MG PO TABS: 600.0000 mg | ORAL_TABLET | Freq: Every day | ORAL | 3 refills | Status: DC

## 2018-04-05 NOTE — Patient Instructions (Addendum)
Go ahead and call [facility] to schedule your next visit with me there. [place], [place], [place] [address]  EYE Specialists: [facility]  [address], [place], [place] [address]  Phone: [phone]  [facility] [place], [place],  [address]  Phone: [phone]  Try taking over the counter ranitidine 150mg  two times daily OR loratadine (Claritin) 10mg  once daily to see if these help your cough. If cough persists, contact us to re-evaluate.    IF you received an x-ray today, you will receive an invoice from [facility]. Please contact [facility] at [phone] with questions or concerns regarding your invoice.   IF you received labwork today, you will receive an invoice from [facility]. Please contact [facility] at [phone] with questions or concerns regarding your invoice.   Our billing staff will not be able to assist you with questions regarding bills from these companies.  You will be contacted with the lab results as soon as they are available. The fastest way to get your results is to activate your My Chart account. Instructions are located on the last page of this paperwork. If you have not heard from [place] regarding the results in 2 weeks, please contact this office.

## 2018-04-05 NOTE — Progress Notes (Signed)
Subjective:    [name], female    DOB: October 23, 1946, 73 y.o.   MRN: [id number]  Karina Davis is a 72 year old female presenting for a follow-up examination of diabetes, hyperlipidemia, and hypertension. She is not regularly monitoring her blood pressure or glucose at home.   12/24/17 labs: A1C 6.9 (up from 6.5) LDL 62 TG 126  Of note she is complaining that her hand/feet neuropathy is worsening. She notices it more at nighttime. She is not currently seeing a podiatrist.   She has not seen an optometrist in several years. She cannot recall who she saw last and would like a recommendation from [place].   She is eating 2-3x daily and is trying to improve her dietary habits. She does acknowledge she does not always eat properly, often getting food such as [employer]. She would like to be below 200lbs. She believes the last weight check in April (200lbs) might be incorrect and that she was closer to the 220's. She attends the gym periodically doing a 30 minute circuit. She walks 10 minutes 3x/day. She states weight watchers app helps her.   Her spinal stenosis has been bothering her. She is not currently seeing PT or chiropractor but states that a chiropractor might help.    Review of Systems  Constitutional: Negative for chills, fatigue, fever and unexpected weight change.  HENT: Negative for congestion, rhinorrhea and sore throat.   Eyes: Negative for photophobia, pain and visual disturbance.  Respiratory: Positive for cough (throat is sensitive). Negative for chest tightness and shortness of breath.   Cardiovascular: Negative for chest pain, palpitations and leg swelling.  Gastrointestinal: Positive for abdominal pain (intermittent) and blood in stool (intermittent bright blood - internal hem.). Negative for constipation, diarrhea, nausea and vomiting.  Endocrine: Positive for cold intolerance (when neuropathy occurs). Negative for heat intolerance and polyuria.  Genitourinary:  Negative for dysuria, frequency and hematuria.  Musculoskeletal: Positive for back pain and gait problem (off balance). Negative for arthralgias and myalgias.       Radiculopathy on right knee when sleeping on right side. Intermittent during day  Neurological: Positive for numbness. Negative for dizziness, weakness, light-headedness and headaches.   Patient Active Problem List   Diagnosis Date Noted  . Spinal stenosis, lumbar region, with neurogenic claudication 09/14/2017  . Osteopenia 09/03/2017  . OSA (obstructive sleep apnea) 05/03/2017  . Diabetic neuropathy ([place]) 03/30/2017  . Carious teeth 01/04/2014  . HTN (hypertension) 06/29/2013  . DM (diabetes mellitus), type 2 ([place]) 06/29/2013  . Hyperlipidemia with target LDL less than 70 06/29/2013  . BMI 37.0-37.9, adult    Prior to Admission medications   Medication Sig Start Date End Date Taking? Authorizing Provider  Ascorbic Acid (VITAMIN C) 1000 MG tablet Take 1,000 mg by mouth daily.   Yes [provider]  aspirin 81 MG tablet Take 81 mg by mouth daily.   Yes [provider]  atorvastatin (LIPITOR) 40 MG tablet Take 1 tablet (40 mg total) by mouth daily. 04/05/18  Yes Jeffery, Chelle, PA-C  azelastine (ASTELIN) 0.1 % nasal spray Place 2 sprays into both nostrils 2 (two) times daily. Use in each nostril as directed 04/05/18  Yes Jeffery, Chelle, PA-C  b complex vitamins tablet Take 1 tablet daily by mouth.   Yes [provider]  Calcium Carb-Cholecalciferol (CALCIUM 600-D PO) Take by mouth.   Yes [provider]  chlorhexidine (PERIDEX) 0.12 % solution USE AS DIRECTED, USE 15 MLS IN THE MOUTH OR  THROAT 2 TIMES A DAY 04/05/18  Yes Jeffery, Chelle, PA-C  Cholecalciferol (VITAMIN D) 2000 units CAPS Take 1 capsule by mouth daily.   Yes [provider]  Coenzyme Q10 200 MG capsule Take 200 mg by mouth daily.   Yes [provider]  cyclobenzaprine (FLEXERIL) 10 MG tablet Take 1 tablet (10  mg total) by mouth at bedtime as needed for muscle spasms. 04/05/18  Yes Jeffery, Chelle, PA-C  Flaxseed, Linseed, (FLAX SEED OIL PO) Take by mouth.   Yes [provider]  hydrochlorothiazide (HYDRODIURIL) 25 MG tablet Take 1 tablet (25 mg total) by mouth daily with breakfast. 04/05/18  Yes Jeffery, Chelle, PA-C  linagliptin (TRADJENTA) 5 MG TABS tablet Take 1 tablet (5 mg total) by mouth daily. 04/05/18  Yes Jeffery, Chelle, PA-C  losartan (COZAAR) 100 MG tablet Take 1 tablet (100 mg total) by mouth daily. 04/05/18  Yes Jeffery, Chelle, PA-C  magnesium oxide (MAG-OX) 400 MG tablet Take 400 mg by mouth daily.   Yes [provider]  meloxicam (MOBIC) 15 MG tablet Take 1 tablet (15 mg total) by mouth daily. 04/05/18  Yes Jeffery, Chelle, PA-C  metFORMIN (GLUCOPHAGE) 500 MG tablet Take 2 tablets (1,000 mg total) by mouth 2 (two) times daily with a meal. 04/05/18  Yes Jeffery, Chelle, PA-C  Misc Natural Products (TART CHERRY ADVANCED) CAPS Take 1 capsule by mouth daily.   Yes [provider]  Multiple Vitamins-Minerals (DIABETES HEALTH PO) Take 1 packet by mouth daily.   Yes [provider]  Nutritional Supplements (BEE POLLEN/ROYAL JELLY/HONEY PO) Take 2 tablets by mouth daily.   Yes [provider]  Nutritional Supplements (JUICE PLUS FIBRE PO) Take 3 tablets by mouth 2 (two) times daily.   Yes [provider]  omega-3 acid ethyl esters (LOVAZA) 1 g capsule Take 1 capsule (1 g total) by mouth 2 (two) times daily. 12/22/17  Yes Jeffery, Chelle, PA-C  Omega-3 Fatty Acids (FISH OIL PO) Take by mouth daily.   Yes [provider]  Potassium Gluconate 595 MG CAPS Take by mouth.   Yes [provider]  Turmeric 500 MG CAPS Take 1 capsule by mouth 2 (two) times daily.   Yes [provider]  gabapentin (NEURONTIN) 600 MG tablet Take 1 tablet (600 mg total) by mouth at bedtime. 04/05/18   Harrison Mons, PA-C   Allergies  Allergen  Reactions  . Cymbalta [Duloxetine Hcl]     Nervous  . Gabapentin     Gait instability, dizziness; tolerates QHS dosing       Objective:   Physical Exam  Constitutional: She is oriented to person, place, and time. She appears well-developed and well-nourished. No distress.  HENT:  Head: Normocephalic and atraumatic.  Eyes: Pupils are equal, round, and reactive to light.    Neck: No thyromegaly present.  Cardiovascular: Normal rate, regular rhythm and normal heart sounds. Exam reveals no gallop and no friction rub.  No murmur heard. Pulmonary/Chest: Effort normal.  Musculoskeletal:       Right foot: There is deformity (bony protrusion).  Feet:  Right Foot:  Protective Sensation: 10 sites tested. 7 sites sensed.  Left Foot:  Protective Sensation: 10 sites tested. 7 sites sensed.  Lymphadenopathy:    She has no cervical adenopathy.  Neurological: She is alert and oriented to person, place, and time.  Skin: Skin is warm and dry.  Psychiatric: She has a normal mood and affect. Her behavior is normal.    Wt Readings  from Last 3 Encounters:  04/05/18 219 lb 9.6 oz (99.6 kg)  02/04/18 200 lb (90.7 kg)  01/18/18 200 lb (90.7 kg)  *4/12 and 3/26 likely not correct.      Assessment & Plan:  1. Type 2 diabetes mellitus with diabetic nephropathy, without long-term current use of insulin (HCC) - A1C showed elevated trend after last labs collected (12/24/17 6.9). Still under 7, however, so continue current regiment and re-evaluate upon today's lab results.  - Hemoglobin A1c - HM DIABETES EYE EXAM - provided recommendations - atorvastatin (LIPITOR) 40 MG tablet; Take 1 tablet (40 mg total) by mouth daily.  Dispense: 90 tablet; Refill: 3 - linagliptin (TRADJENTA) 5 MG TABS tablet; Take 1 tablet (5 mg total) by mouth daily.  Dispense: 90 tablet; Refill: 3 - metFORMIN (GLUCOPHAGE) 500 MG tablet; Take 2 tablets (1,000 mg total) by mouth 2 (two) times daily with a meal.  Dispense: 360  tablet; Refill: 3   3. Essential hypertension Controlled; continue current regimen - CBC with Differential/Platelet - T3, free - T4 - TSH - gabapentin (NEURONTIN) 600 MG tablet; Take 1 tablet (600 mg total) by mouth at bedtime.  Dispense: 90 tablet; Refill: 3 - hydrochlorothiazide (HYDRODIURIL) 25 MG tablet; Take 1 tablet (25 mg total) by mouth daily with breakfast.  Dispense: 90 tablet; Refill: 3 - losartan (COZAAR) 100 MG tablet; Take 1 tablet (100 mg total) by mouth daily.  Dispense: 90 tablet; Refill: 3  4. Hyperlipidemia with target LDL less than 70 Well controlled (12/24/17 labs = LDL 62); continue current regimen. - Comprehensive metabolic panel - atorvastatin (LIPITOR) 40 MG tablet; Take 1 tablet (40 mg total) by mouth daily.  Dispense: 90 tablet; Refill: 3  5. Cold intolerance Potentially related to diabetes. Re-evaluate upon lab results: - T3, free - T4 - TSH  6. Cough Trial of ranitidine 150mg  BID or loratadine 10mg  to differentiate between GERD and allergy etiology.  - azelastine (ASTELIN) 0.1 % nasal spray; Place 2 sprays into both nostrils 2 (two) times daily. Use in each nostril as directed  Dispense: 90 mL; Refill: 3  7. Poor oral hygiene Patient is happy with results of current peridex treatment; continue regimen.  - chlorhexidine (PERIDEX) 0.12 % solution; USE AS DIRECTED, USE 15 MLS IN THE MOUTH OR THROAT 2 TIMES A DAY  Dispense: 1892 mL; Refill: PRN  8. Spinal stenosis, lumbar region, with neurogenic claudication Neuropathy likely due to spinal stenosis vs. diabetes; continue following up with orthopedist. Re-evaluate at next scheduled visit.  - meloxicam (MOBIC) 15 MG tablet; Take 1 tablet (15 mg total) by mouth daily.  Dispense: 90 tablet; Refill: 3 - cyclobenzaprine (FLEXERIL) 10 MG tablet; Take 1 tablet (10 mg total) by mouth at bedtime as needed for muscle spasms.  Dispense: 90 tablet; Refill: 3  Return in about 3 months (around 07/06/2018) for re-evalaution  of diabetes, blood pressure, choelsterol, etc.

## 2018-04-05 NOTE — Progress Notes (Signed)
[name], female    DOB: [birth year], 72 y.o.   MRN: [id number]  PCP: Harrison Mons, PA-C  Chief Complaint  Patient presents with  . Diabetes    follow up  . Hyperlipidemia    follow up  . Hypertension    follow up    Subjective:   Presents for evaluation of diabetes, hypertension, hyperlipidemia.  In general, she feels well, though she thinks that neuropathy is worsening.  Continues to be worse at night.  She has known spinal stenosis.  Has not seen an optometrist in a number of years, cannot recall who she previously saw and requests a recommendation.  Trying to make healthier eating choices.  Continues to eat fast food.  Goal weight is less than 200 pounds.  Inconsistent exercise, when she does go to the gym she typically does a 30-minute circuit.  Walks for 10 minutes, usually 3 times each day.  Uses an application on her phone that helps her with calories.. Recent URI. Cough. Throat feels sensitive to certain foods. Peripheral neuropathy seems worse. Did not tolerate duloxetine or gabapentin. Has been a little off balance with walking lately. Seeing orthopedics for lumbar spondylolisthesis.  Last A1C 12/2017 was 6.9%, up from 6.5%. LDL was 63. Renal and hepatic tests were normal.  Review of Systems Constitutional: Negative for chills, fatigue, fever and unexpected weight change.  HENT: Negative for congestion, rhinorrhea and sore throat.   Eyes: Negative for photophobia, pain and visual disturbance.  Respiratory: Positive for cough (throat is sensitive). Negative for chest tightness and shortness of breath.   Cardiovascular: Negative for chest pain, palpitations and leg swelling.  Gastrointestinal: Positive for abdominal pain (intermittent) and blood in stool (intermittent bright blood - internal hem.). Negative for constipation, diarrhea, nausea and vomiting.  Endocrine: Positive for cold intolerance (when neuropathy occurs). Negative for heat  intolerance and polyuria.  Genitourinary: Negative for dysuria, frequency and hematuria.  Musculoskeletal: Positive for back pain and gait problem (off balance). Negative for arthralgias and myalgias.       Radiculopathy on right knee when sleeping on right side. Intermittent during day  Neurological: Positive for numbness. Negative for dizziness, weakness, light-headedness and headaches.       Patient Active Problem List   Diagnosis Date Noted  . Spinal stenosis, lumbar region, with neurogenic claudication 09/14/2017  . Osteopenia 09/03/2017  . OSA (obstructive sleep apnea) 05/03/2017  . Diabetic neuropathy ([place]) 03/30/2017  . Carious teeth 01/04/2014  . HTN (hypertension) 06/29/2013  . DM (diabetes mellitus), type 2 ([place]) 06/29/2013  . Hyperlipidemia with target LDL less than 70 06/29/2013  . BMI 37.0-37.9, adult      Prior to Admission medications   Medication Sig Start Date End Date Taking? Authorizing Provider  Ascorbic Acid (VITAMIN C) 1000 MG tablet Take 1,000 mg by mouth daily.   Yes [provider]  aspirin 81 MG tablet Take 81 mg by mouth daily.   Yes [provider]  atorvastatin (LIPITOR) 40 MG tablet Take 1 tablet (40 mg total) by mouth daily. 12/22/17  Yes Jubal Rademaker, PA-C  azelastine (ASTELIN) 0.1 % nasal spray Place 2 sprays into both nostrils 2 (two) times daily. Use in each nostril as directed 09/14/17  Yes Americus Perkey, PA-C  b complex vitamins tablet Take 1 tablet daily by mouth.   Yes [provider]  Calcium Carb-Cholecalciferol (CALCIUM 600-D PO) Take by mouth.   Yes [provider]  chlorhexidine (PERIDEX) 0.12 % solution  USE AS DIRECTED, USE 15 MLS IN THE MOUTH OR THROAT 2 TIMES A DAY  12/20/17  Yes Paityn Balsam, PA-C  Cholecalciferol (VITAMIN D) 2000 units CAPS Take 1 capsule by mouth daily.   Yes [provider]  Coenzyme Q10 200 MG capsule Take 200 mg by mouth daily.   Yes [provider]    cyclobenzaprine (FLEXERIL) 10 MG tablet Take 1 tablet (10 mg total) by mouth at bedtime as needed for muscle spasms. 12/24/17  Yes Christpoher Sievers, PA-C  Flaxseed, Linseed, (FLAX SEED OIL PO) Take by mouth.   Yes [provider]  gabapentin (NEURONTIN) 300 MG capsule Take 2 capsules (600 mg total) at bedtime by mouth. 09/03/17  Yes Aasha Dina, PA-C  hydrochlorothiazide (HYDRODIURIL) 25 MG tablet Take 1 tablet (25 mg total) by mouth daily with breakfast. 12/22/17  Yes Elick Aguilera, PA-C  linagliptin (TRADJENTA) 5 MG TABS tablet Take 1 tablet (5 mg total) by mouth daily. 06/15/17  Yes Quatisha Zylka, PA-C  losartan (COZAAR) 100 MG tablet Take 1 tablet (100 mg total) by mouth daily. 12/22/17  Yes Mekiah Wahler, PA-C  magnesium oxide (MAG-OX) 400 MG tablet Take 400 mg by mouth daily.   Yes [provider]  meloxicam (MOBIC) 15 MG tablet Take 1 tablet (15 mg total) by mouth daily. 12/22/17  Yes Katelan Hirt, PA-C  metFORMIN (GLUCOPHAGE) 500 MG tablet Take 2 tablets (1,000 mg total) by mouth 2 (two) times daily with a meal. 12/22/17  Yes Auburn Hert, PA-C  Misc Natural Products (TART CHERRY ADVANCED) CAPS Take 1 capsule by mouth daily.   Yes [provider]  Multiple Vitamins-Minerals (DIABETES HEALTH PO) Take 1 packet by mouth daily.   Yes [provider]  Nutritional Supplements (BEE POLLEN/ROYAL JELLY/HONEY PO) Take 2 tablets by mouth daily.   Yes [provider]  Nutritional Supplements (JUICE PLUS FIBRE PO) Take 3 tablets by mouth 2 (two) times daily.   Yes [provider]  omega-3 acid ethyl esters (LOVAZA) 1 g capsule Take 1 capsule (1 g total) by mouth 2 (two) times daily. 12/22/17  Yes Jahnyla Parrillo, PA-C  Omega-3 Fatty Acids (FISH OIL PO) Take by mouth daily.   Yes [provider]  Potassium Gluconate 595 MG CAPS Take by mouth.   Yes [provider]  Turmeric 500 MG CAPS Take 1 capsule by mouth 2 (two) times  daily.   Yes [provider]     Allergies  Allergen Reactions  . Cymbalta [Duloxetine Hcl]     Nervous  . Gabapentin     Gait instability, dizziness       Objective:  Physical Exam  Constitutional: She is oriented to person, place, and time. She appears well-developed and well-nourished. She is active and cooperative. No distress.  BP (!) 142/80   Pulse 85   Temp 98.5 F (36.9 C)   Resp 16   Ht 5' 1.81" (1.57 m)   Wt 219 lb 9.6 oz (99.6 kg)   SpO2 95%   BMI 40.41 kg/m   HENT:  Head: Normocephalic and atraumatic.  Right Ear: Hearing normal.  Left Ear: Hearing normal.  Eyes: Pupils are equal, round, and reactive to light. Conjunctivae and EOM are normal. No scleral icterus.  Fundoscopic exam:      The right eye shows no exudate, no hemorrhage and no papilledema. The right eye shows red reflex.       The left eye shows no exudate, no hemorrhage and no papilledema.  The left eye shows red reflex.  Neck: Normal range of motion. Neck supple. No thyromegaly present.  Cardiovascular: Normal rate, regular rhythm and normal heart sounds.  Pulses:      Radial pulses are 2+ on the right side, and 2+ on the left side.  Pulmonary/Chest: Effort normal and breath sounds normal.  Lymphadenopathy:       Head (right side): No tonsillar, no preauricular, no posterior auricular and no occipital adenopathy present.       Head (left side): No tonsillar, no preauricular, no posterior auricular and no occipital adenopathy present.    She has no cervical adenopathy.       Right: No supraclavicular adenopathy present.       Left: No supraclavicular adenopathy present.  Neurological: She is alert and oriented to person, place, and time. No sensory deficit.  Skin: Skin is warm, dry and intact. No rash noted. No cyanosis or erythema. Nails show no clubbing.  Psychiatric: She has a normal mood and affect. Her speech is normal and behavior is normal.    Wt Readings from Last 3 Encounters:    04/05/18 219 lb 9.6 oz (99.6 kg)  02/04/18 200 lb (90.7 kg)  01/18/18 200 lb (90.7 kg)  Patient reports that the weights on 3/26 and 4/12 are not correct.        Assessment & Plan:   Problem List Items Addressed This Visit    Spinal stenosis, lumbar region, with neurogenic claudication   Relevant Medications   meloxicam (MOBIC) 15 MG tablet   cyclobenzaprine (FLEXERIL) 10 MG tablet   Hyperlipidemia with target LDL less than 70   Relevant Medications   atorvastatin (LIPITOR) 40 MG tablet   hydrochlorothiazide (HYDRODIURIL) 25 MG tablet   losartan (COZAAR) 100 MG tablet   Other Relevant Orders   Comprehensive metabolic panel (Completed)   HTN (hypertension) (Chronic)    Above goal today.      Relevant Medications   atorvastatin (LIPITOR) 40 MG tablet   gabapentin (NEURONTIN) 600 MG tablet   hydrochlorothiazide (HYDRODIURIL) 25 MG tablet   losartan (COZAAR) 100 MG tablet   Other Relevant Orders   CBC with Differential/Platelet (Completed)   T3, free (Completed)   T4 (Completed)   TSH (Completed)   DM (diabetes mellitus), type 2 ([place]) - Primary (Chronic)    Counseled on the importance of healthy eating choices, in addition to her medications.      Relevant Medications   atorvastatin (LIPITOR) 40 MG tablet   linagliptin (TRADJENTA) 5 MG TABS tablet   losartan (COZAAR) 100 MG tablet   metFORMIN (GLUCOPHAGE) 500 MG tablet   Other Relevant Orders   Hemoglobin A1c (Completed)   HM DIABETES EYE EXAM (Completed)   Diabetic neuropathy ([place])    Continue efforts to manage diabetes.  Continue meloxicam and cyclobenzaprine.  Recall that she did not tolerate Cymbalta nor gabapentin.      Relevant Medications   atorvastatin (LIPITOR) 40 MG tablet   linagliptin (TRADJENTA) 5 MG TABS tablet   losartan (COZAAR) 100 MG tablet   metFORMIN (GLUCOPHAGE) 500 MG tablet    Other Visit Diagnoses    Cold intolerance       Relevant Orders   T3, free (Completed)   T4 (Completed)    TSH (Completed)   Cough       Normal lung exam. Treat for URI-type symptoms and supportive care.   Relevant Medications   azelastine (ASTELIN) 0.1 % nasal spray   Poor oral hygiene  Relevant Medications   chlorhexidine (PERIDEX) 0.12 % solution       Return in about 3 months (around 07/06/2018) for re-evalaution of diabetes, blood pressure, choelsterol, etc.   Fara Chute, PA-C [facility]

## 2018-04-06 LAB — T4: T4, Total: 6.7 ug/dL (ref 4.5–12.0)

## 2018-04-06 LAB — COMPREHENSIVE METABOLIC PANEL
A/G RATIO: 1.9 (ref 1.2–2.2)
ALK PHOS: 97 IU/L (ref 39–117)
ALT: 15 IU/L (ref 0–32)
AST: 13 IU/L (ref 0–40)
Albumin: 4.3 g/dL (ref 3.6–4.8)
BILIRUBIN TOTAL: 0.2 mg/dL (ref 0.0–1.2)
BUN / CREAT RATIO: 23 (ref 12–28)
BUN: 19 mg/dL (ref 8–27)
CO2: 22 mmol/L (ref 20–29)
Calcium: 10.3 mg/dL (ref 8.7–10.3)
Chloride: 101 mmol/L (ref 96–106)
Creatinine, Ser: 0.83 mg/dL (ref 0.57–1.00)
GFR calc non Af Amer: 72 mL/min/{1.73_m2} (ref >59)
GFR, EST AFRICAN AMERICAN: 83 mL/min/{1.73_m2} (ref >59)
GLOBULIN, TOTAL: 2.3 g/dL (ref 1.5–4.5)
Glucose: 136 mg/dL — ABNORMAL HIGH (ref 65–99)
Potassium: 4.5 mmol/L (ref 3.5–5.2)
SODIUM: 141 mmol/L (ref 134–144)
TOTAL PROTEIN: 6.6 g/dL (ref 6.0–8.5)

## 2018-04-06 LAB — CBC WITH DIFFERENTIAL/PLATELET
BASOS ABS: 0 10*3/uL (ref 0.0–0.2)
Basos: 0 %
EOS (ABSOLUTE): 0.2 10*3/uL (ref 0.0–0.4)
Eos: 2 %
HEMATOCRIT: 36.4 % (ref 34.0–46.6)
HEMOGLOBIN: 12.1 g/dL (ref 11.1–15.9)
Immature Grans (Abs): 0 10*3/uL (ref 0.0–0.1)
Immature Granulocytes: 0 %
LYMPHS ABS: 2.4 10*3/uL (ref 0.7–3.1)
Lymphs: 26 %
MCH: 28.3 pg (ref 26.6–33.0)
MCHC: 33.2 g/dL (ref 31.5–35.7)
MCV: 85 fL (ref 79–97)
MONOCYTES: 5 %
MONOS ABS: 0.5 10*3/uL (ref 0.1–0.9)
NEUTROS ABS: 6.3 10*3/uL (ref 1.4–7.0)
Neutrophils: 67 %
Platelets: 289 10*3/uL (ref 150–450)
RBC: 4.28 x10E6/uL (ref 3.77–5.28)
RDW: 15.5 % — AB (ref 12.3–15.4)
WBC: 9.5 10*3/uL (ref 3.4–10.8)

## 2018-04-06 LAB — HEMOGLOBIN A1C
Est. average glucose Bld gHb Est-mCnc: 146 mg/dL
Hgb A1c MFr Bld: 6.7 % — ABNORMAL HIGH (ref 4.8–5.6)

## 2018-04-06 LAB — TSH: TSH: 4.73 u[IU]/mL — AB (ref 0.450–4.500)

## 2018-04-06 LAB — T3, FREE: T3 FREE: 2.7 pg/mL (ref 2.0–4.4)

## 2018-04-08 NOTE — Assessment & Plan Note (Signed)
Above goal today.

## 2018-04-08 NOTE — Assessment & Plan Note (Signed)
Continue efforts to manage diabetes.  Continue meloxicam and cyclobenzaprine.  Recall that she did not tolerate Cymbalta nor gabapentin.

## 2018-04-08 NOTE — Assessment & Plan Note (Signed)
Counseled on the importance of healthy eating choices, in addition to her medications.

## 2018-04-12 ENCOUNTER — Telehealth: Payer: Self-pay | Admitting: Physician Assistant

## 2018-04-12 NOTE — Telephone Encounter (Signed)
Copied from [place] [id number]. Topic: Quick Communication - See Telephone Encounter >> Apr 12, 2018  5:27 PM Nils Flack wrote: CRM for notification. See Telephone encounter for: 04/12/18. Pt needs to have meds sent to [facility].  Meds were [facility] to [facility], and pt was told that she is not eligible for [facility] prescription benefits.   Atorvastatin Azelastine Peridex Flexeril Gabapentin HCTZ Trajenta Losartan Meloxicam Metofrmin

## 2018-04-13 ENCOUNTER — Other Ambulatory Visit: Payer: Self-pay

## 2018-04-13 DIAGNOSIS — Z9189 Other specified personal risk factors, not elsewhere classified: Secondary | ICD-10-CM

## 2018-04-13 DIAGNOSIS — R059 Cough, unspecified: Secondary | ICD-10-CM

## 2018-04-13 DIAGNOSIS — E1121 Type 2 diabetes mellitus with diabetic nephropathy: Secondary | ICD-10-CM

## 2018-04-13 DIAGNOSIS — E785 Hyperlipidemia, unspecified: Secondary | ICD-10-CM

## 2018-04-13 DIAGNOSIS — M48062 Spinal stenosis, lumbar region with neurogenic claudication: Secondary | ICD-10-CM

## 2018-04-13 DIAGNOSIS — I10 Essential (primary) hypertension: Secondary | ICD-10-CM

## 2018-04-13 DIAGNOSIS — R05 Cough: Secondary | ICD-10-CM

## 2018-04-13 MED ORDER — GABAPENTIN 600 MG PO TABS: 600.0000 mg | ORAL_TABLET | Freq: Every day | ORAL | 3 refills | Status: AC

## 2018-04-13 MED ORDER — CHLORHEXIDINE GLUCONATE 0.12 % MT SOLN
OROMUCOSAL | 99 refills | Status: AC
Start: 2018-04-13 — End: ?

## 2018-04-13 MED ORDER — MELOXICAM 15 MG PO TABS: 15.0000 mg | ORAL_TABLET | Freq: Every day | ORAL | 3 refills | Status: DC

## 2018-04-13 MED ORDER — HYDROCHLOROTHIAZIDE 25 MG PO TABS
25.0000 mg | ORAL_TABLET | Freq: Every day | ORAL | 3 refills | Status: AC
Start: 2018-04-13 — End: ?

## 2018-04-13 MED ORDER — LOSARTAN POTASSIUM 100 MG PO TABS: 100.0000 mg | ORAL_TABLET | Freq: Every day | ORAL | 3 refills | Status: AC

## 2018-04-13 MED ORDER — LINAGLIPTIN 5 MG PO TABS: 5.0000 mg | ORAL_TABLET | Freq: Every day | ORAL | 3 refills | Status: AC

## 2018-04-13 MED ORDER — METFORMIN HCL 500 MG PO TABS: 1000.0000 mg | ORAL_TABLET | Freq: Two times a day (BID) | ORAL | 3 refills | Status: AC

## 2018-04-13 MED ORDER — AZELASTINE HCL 0.1 % NA SOLN
2.0000 | Freq: Two times a day (BID) | NASAL | 3 refills | Status: AC
Start: 2018-04-13 — End: ?

## 2018-04-13 MED ORDER — ATORVASTATIN CALCIUM 40 MG PO TABS: 40.0000 mg | ORAL_TABLET | Freq: Every day | ORAL | 3 refills | Status: AC

## 2018-04-13 MED ORDER — CYCLOBENZAPRINE HCL 10 MG PO TABS: 10.0000 mg | ORAL_TABLET | Freq: Every evening | ORAL | 3 refills | Status: AC | PRN

## 2018-04-13 NOTE — Telephone Encounter (Signed)
Pt is out of gabapentin also

## 2018-04-13 NOTE — Telephone Encounter (Signed)
LOV  04/05/18 Dr. Pamella Pert See request.

## 2018-04-13 NOTE — Telephone Encounter (Signed)
Pharmacy updated in chart All meds listed in message reordered and sent to [facility]

## 2018-04-13 NOTE — Telephone Encounter (Signed)
Pt calling back to check status and states she is out of Trajenta.

## 2018-04-15 ENCOUNTER — Ambulatory Visit: Payer: PPO | Admitting: Physician Assistant

## 2018-04-23 DIAGNOSIS — G4733 Obstructive sleep apnea (adult) (pediatric): Secondary | ICD-10-CM | POA: Diagnosis not present

## 2018-05-23 DIAGNOSIS — G4733 Obstructive sleep apnea (adult) (pediatric): Secondary | ICD-10-CM | POA: Diagnosis not present

## 2018-06-07 ENCOUNTER — Ambulatory Visit (INDEPENDENT_AMBULATORY_CARE_PROVIDER_SITE_OTHER): Payer: PPO | Admitting: Orthopaedic Surgery

## 2018-06-07 ENCOUNTER — Encounter (INDEPENDENT_AMBULATORY_CARE_PROVIDER_SITE_OTHER): Payer: Self-pay | Admitting: Orthopaedic Surgery

## 2018-06-07 VITALS — BP 139/78 | HR 84 | Ht 62.0 in | Wt 215.0 lb

## 2018-06-07 DIAGNOSIS — M48062 Spinal stenosis, lumbar region with neurogenic claudication: Secondary | ICD-10-CM | POA: Diagnosis not present

## 2018-06-07 NOTE — Progress Notes (Signed)
Office Visit Note   Patient: Karina Davis           Date of Birth: [birth year]           MRN: [id number] Visit Date: 06/07/2018              Requested by: Harrison Mons, PA-C [address] [place], [place] [address] PCP: No primary care provider on file.   Assessment & Plan: Visit Diagnoses:  1. Spinal stenosis, lumbar region, with neurogenic claudication     Plan: Patient has moderate spinal stenosis at L4-5 and mild at L2-3.  Serial MRI showed some progression.  She is continuing to work on weight loss, dieting which will help her cholesterol, diabetes and hypertension.  I will check her again in 2 months.  Currently she can walk 15 minutes before she begins to have some increased symptoms, sits for 5 minutes and gets relief.  Long discussion about operative choices for decompression as well as decompression and fusion.  I plan to recheck her in 2 months.  Follow-Up Instructions: Return in about 2 months (around 08/07/2018).   Orders:  No orders of the defined types were placed in this encounter.  No orders of the defined types were placed in this encounter.     Procedures: No procedures performed   Clinical Data: No additional findings.   Subjective: Chief Complaint  Patient presents with  . Lower Back - Pain, Follow-up    HPI 72 year old female returns for four-month follow-up of ongoing back problems with previous MRI April 2019 showing mild progression disc degeneration at L2-3 with mild stenosis and moderate multifactorial stenosis at L4-5.  She can stand for 15 minute period of time she is able to ambulate as long as she needs to .  She is not using any walking aids.  Cymbalta made her nervous.  She uses gabapentin and muscle relaxant at night.  She has slightly more numbness on the right leg than left leg.  Previous x-rays showed anterolisthesis at L4-5 with moderate stenosis.  No chills or fever no bowel or bladder symptoms.  Flexion-extension x-rays did  not show any motion at the L4-5 level with maintained slight anterolisthesis grade 1 imaged in April 2019.  Review of Systems 14 point review of systems updated unchanged from 01/18/2018.  Of note is obesity with BMI 39, type 2 diabetes, hyperlipidemia, diabetic neuropathy, sleep apnea and hypertension.   Objective: Vital Signs: BP 139/78   Pulse 84   Ht 5\' 2"  (1.575 m)   Wt 215 lb (97.5 kg)   BMI 39.32 kg/m   Physical Exam  Constitutional: She is oriented to person, place, and time. She appears well-developed.  HENT:  Head: Normocephalic.  Right Ear: External ear normal.  Left Ear: External ear normal.  Eyes: Pupils are equal, round, and reactive to light.  Neck: No tracheal deviation present. No thyromegaly present.  Cardiovascular: Normal rate.  Pulmonary/Chest: Effort normal.  Abdominal: Soft.  Neurological: She is alert and oriented to person, place, and time.  Skin: Skin is warm and dry.  Psychiatric: She has a normal mood and affect. Her behavior is normal.    Ortho Exam patient has bilateral decreased sensation from the mid tibia distally.  No plantar foot lesions.  Knee and ankle jerk are intact anterior tib gastrocsoleus heel and toe walking is normal.  No quad weakness normal hip range of motion.  Mild sciatic notch tenderness negative straight leg raising 90 degrees.  Specialty Comments:  No specialty comments available.  Imaging: No results found.   PMFS History: Patient Active Problem List   Diagnosis Date Noted  . Spinal stenosis, lumbar region, with neurogenic claudication 09/14/2017  . Osteopenia 09/03/2017  . OSA (obstructive sleep apnea) 05/03/2017  . Diabetic neuropathy ([place]) 03/30/2017  . Carious teeth 01/04/2014  . HTN (hypertension) 06/29/2013  . DM (diabetes mellitus), type 2 ([place]) 06/29/2013  . Hyperlipidemia with target LDL less than 70 06/29/2013  . BMI 37.0-37.9, adult    Past Medical History:  Diagnosis Date  . Ankle fracture   .  Diabetes mellitus without complication ([place])   . Diabetic neuropathy ([place]) 03/30/2017  . Hypertension   . Neuropathy   . Obesity     Family History  Problem Relation Age of Onset  . Cancer Mother 24       Colon  . Colon cancer Mother 54  . Stroke Father 56  . Diabetes Father   . Heart disease Father   . Hypertension Father   . Hypertension Sister   . Diabetes Sister   . Kidney disease Sister        due to HTN and DM  . Hypertension Brother   . Stroke Brother   . Hypertension Daughter   . Diabetes Daughter   . Obesity Daughter        gastric sleeve surgery 03/05/2014  . Hypertension Sister   . Drug abuse Sister        narcotic abuse  . Stroke Sister        associated with narcotic abuse  . Diabetes Sister   . Hypertension Sister   . Hyperlipidemia Sister   . Hypertension Brother   . Drug abuse Son        cocaine  . Stroke Brother   . Hypertension Brother     Past Surgical History:  Procedure Laterality Date  . APPENDECTOMY  1955   childhood  . COLON SURGERY     Social History   Occupational History  . Occupation: Realtor  Tobacco Use  . Smoking status: Never Smoker  . Smokeless tobacco: Never Used  Substance and Sexual Activity  . Alcohol use: Yes    Comment: Socially  . Drug use: No  . Sexual activity: Not Currently

## 2018-06-10 ENCOUNTER — Encounter (INDEPENDENT_AMBULATORY_CARE_PROVIDER_SITE_OTHER): Payer: Self-pay | Admitting: Orthopaedic Surgery

## 2018-07-08 ENCOUNTER — Ambulatory Visit (INDEPENDENT_AMBULATORY_CARE_PROVIDER_SITE_OTHER): Payer: PPO | Admitting: Orthopaedic Surgery

## 2018-08-02 DIAGNOSIS — Z23 Encounter for immunization: Secondary | ICD-10-CM | POA: Diagnosis not present

## 2018-08-02 DIAGNOSIS — N183 Chronic kidney disease, stage 3 (moderate): Secondary | ICD-10-CM | POA: Diagnosis not present

## 2018-08-02 DIAGNOSIS — I10 Essential (primary) hypertension: Secondary | ICD-10-CM | POA: Diagnosis not present

## 2018-08-02 DIAGNOSIS — E785 Hyperlipidemia, unspecified: Secondary | ICD-10-CM | POA: Diagnosis not present

## 2018-08-02 DIAGNOSIS — Z6841 Body Mass Index (BMI) 40.0 and over, adult: Secondary | ICD-10-CM | POA: Diagnosis not present

## 2018-08-02 DIAGNOSIS — E1142 Type 2 diabetes mellitus with diabetic polyneuropathy: Secondary | ICD-10-CM | POA: Diagnosis not present

## 2018-08-02 DIAGNOSIS — E1122 Type 2 diabetes mellitus with diabetic chronic kidney disease: Secondary | ICD-10-CM | POA: Diagnosis not present

## 2018-08-02 DIAGNOSIS — K029 Dental caries, unspecified: Secondary | ICD-10-CM | POA: Diagnosis not present

## 2018-08-05 ENCOUNTER — Encounter (INDEPENDENT_AMBULATORY_CARE_PROVIDER_SITE_OTHER): Payer: Self-pay | Admitting: Orthopaedic Surgery

## 2018-08-05 ENCOUNTER — Ambulatory Visit (INDEPENDENT_AMBULATORY_CARE_PROVIDER_SITE_OTHER): Payer: PPO | Admitting: Orthopaedic Surgery

## 2018-08-05 VITALS — BP 173/92 | HR 86 | Ht 62.0 in | Wt 221.0 lb

## 2018-08-05 DIAGNOSIS — Z6841 Body Mass Index (BMI) 40.0 and over, adult: Secondary | ICD-10-CM | POA: Insufficient documentation

## 2018-08-05 DIAGNOSIS — M48061 Spinal stenosis, lumbar region without neurogenic claudication: Secondary | ICD-10-CM | POA: Diagnosis not present

## 2018-08-05 NOTE — Progress Notes (Signed)
Office Visit Note   Patient: Karina Davis           Date of Birth: September 21, 1946           MRN: [id number] Visit Date: 08/05/2018              Requested by: Harrison Mons, [place], [place] [address] PCP: Harrison Mons, PA   Assessment & Plan: Visit Diagnoses:  1. Class 3 severe obesity due to excess calories with serious comorbidity and body mass index (BMI) of 40.0 to 44.9 in adult [facility])   2. Body mass index (BMI) of 40.0 to 44.9 in adult [facility])   3. Spinal stenosis of lumbar region, unspecified whether neurogenic claudication present     Plan: .The patient meets the AMA guidelines for Morbid (severe) obesity with a BMI > 40.0 and I have recommended weight loss.  We discussed daily walking which she enjoys.  She has a pool at her house that she uses in the summertime.  We discussed going to the gym she can use the elliptical, bike, treadmill when it is cold outside.  Currently her spinal stenosis symptoms are not severe enough to consider operative intervention and I will recheck her in 6 months.  Today we reviewed images of her MRI scan and the report.  She has greater than 50% narrowing of the canal at L4-5 but can still ambulate greater than a mile.  She will work on diet weight loss follow her diabetic diet and avoid skittles which got her into trouble recently.  Return in 6 months.    Follow-Up Instructions: Return in about 6 months (around 02/04/2019).   Orders:  No orders of the defined types were placed in this encounter.  No orders of the defined types were placed in this encounter.     Procedures: No procedures performed   Clinical Data: No additional findings.   Subjective: Chief Complaint  Patient presents with  . Lower Back - Pain    HPI 72 year old female returns for follow-up of L4-5 lumbar stenosis.  She states she went to [place] and is able to walk many times over a mile and average 3 to 5 miles per day total.  She still has  numbness in her feet and last A1c was 7.4 previously 4 months ago 6.7.  Uses meloxicam as needed and has been taking gabapentin.  She does better if she is moving around has some increased symptoms if she stands in one place for more than 15 to 20 minutes.  No associated bowel bladder symptoms.  Review of Systems 14 point review of systems updated unchanged from 01/18/2018.  BMI is increased.  She has peripheral neuropathy associated with diabetes.   Objective: Vital Signs: BP (!) 173/92   Pulse 86   Ht 5\' 2"  (1.575 m)   Wt 221 lb (100.2 kg)   BMI 40.42 kg/m   Physical Exam  Constitutional: She is oriented to person, place, and time. She appears well-developed.  HENT:  Head: Normocephalic.  Right Ear: External ear normal.  Left Ear: External ear normal.  Eyes: Pupils are equal, round, and reactive to light.  Neck: No tracheal deviation present. No thyromegaly present.  Cardiovascular: Normal rate.  Pulmonary/Chest: Effort normal.  Abdominal: Soft.  Neurological: She is alert and oriented to person, place, and time.  Skin: Skin is warm and dry.  Psychiatric: She has a normal mood and affect. Her behavior is normal.    Ortho Exam  patient gets from sitting standing.  No isolated motor weakness.  Decreased sensation ankles and feet bilaterally.  Normal hip range of motion.  No atrophy no cellulitis.  Specialty Comments:  No specialty comments available.  Imaging: No results found.   PMFS History: Patient Active Problem List   Diagnosis Date Noted  . Spinal stenosis, lumbar region, with neurogenic claudication 09/14/2017  . Osteopenia 09/03/2017  . OSA (obstructive sleep apnea) 05/03/2017  . Diabetic neuropathy ([place]) 03/30/2017  . Carious teeth 01/04/2014  . HTN (hypertension) 06/29/2013  . DM (diabetes mellitus), type 2 ([place]) 06/29/2013  . Hyperlipidemia with target LDL less than 70 06/29/2013  . BMI 37.0-37.9, adult    Past Medical History:  Diagnosis Date  . Ankle  fracture   . Diabetes mellitus without complication ([place])   . Diabetic neuropathy ([place]) 03/30/2017  . Hypertension   . Neuropathy   . Obesity     Family History  Problem Relation Age of Onset  . Cancer Mother 48       Colon  . Colon cancer Mother 27  . Stroke Father 76  . Diabetes Father   . Heart disease Father   . Hypertension Father   . Hypertension Sister   . Diabetes Sister   . Kidney disease Sister        due to HTN and DM  . Hypertension Brother   . Stroke Brother   . Hypertension Daughter   . Diabetes Daughter   . Obesity Daughter        gastric sleeve surgery 03/05/2014  . Hypertension Sister   . Drug abuse Sister        narcotic abuse  . Stroke Sister        associated with narcotic abuse  . Diabetes Sister   . Hypertension Sister   . Hyperlipidemia Sister   . Hypertension Brother   . Drug abuse Son        cocaine  . Stroke Brother   . Hypertension Brother     Past Surgical History:  Procedure Laterality Date  . APPENDECTOMY  1955   childhood  . COLON SURGERY     Social History   Occupational History  . Occupation: Realtor  Tobacco Use  . Smoking status: Never Smoker  . Smokeless tobacco: Never Used  Substance and Sexual Activity  . Alcohol use: Yes    Comment: Socially  . Drug use: No  . Sexual activity: Not Currently

## 2018-11-03 DIAGNOSIS — I10 Essential (primary) hypertension: Secondary | ICD-10-CM | POA: Diagnosis not present

## 2018-11-03 DIAGNOSIS — E785 Hyperlipidemia, unspecified: Secondary | ICD-10-CM | POA: Diagnosis not present

## 2018-11-03 DIAGNOSIS — E1142 Type 2 diabetes mellitus with diabetic polyneuropathy: Secondary | ICD-10-CM | POA: Diagnosis not present

## 2018-11-03 DIAGNOSIS — N183 Chronic kidney disease, stage 3 (moderate): Secondary | ICD-10-CM | POA: Diagnosis not present

## 2018-11-03 DIAGNOSIS — Z0000 Encounter for general adult medical examination without abnormal findings: Secondary | ICD-10-CM | POA: Diagnosis not present

## 2018-11-03 DIAGNOSIS — Z1231 Encounter for screening mammogram for malignant neoplasm of breast: Secondary | ICD-10-CM | POA: Diagnosis not present

## 2018-11-03 DIAGNOSIS — Z6841 Body Mass Index (BMI) 40.0 and over, adult: Secondary | ICD-10-CM | POA: Diagnosis not present

## 2018-11-03 DIAGNOSIS — E1122 Type 2 diabetes mellitus with diabetic chronic kidney disease: Secondary | ICD-10-CM | POA: Diagnosis not present

## 2018-12-01 ENCOUNTER — Other Ambulatory Visit: Payer: Self-pay | Admitting: Physician Assistant

## 2018-12-01 DIAGNOSIS — Z1231 Encounter for screening mammogram for malignant neoplasm of breast: Secondary | ICD-10-CM

## 2018-12-28 DIAGNOSIS — I10 Essential (primary) hypertension: Secondary | ICD-10-CM | POA: Diagnosis not present

## 2018-12-28 DIAGNOSIS — M4316 Spondylolisthesis, lumbar region: Secondary | ICD-10-CM | POA: Diagnosis not present

## 2018-12-28 DIAGNOSIS — Z6841 Body Mass Index (BMI) 40.0 and over, adult: Secondary | ICD-10-CM | POA: Diagnosis not present

## 2019-01-11 ENCOUNTER — Telehealth (INDEPENDENT_AMBULATORY_CARE_PROVIDER_SITE_OTHER): Payer: Self-pay

## 2019-01-11 ENCOUNTER — Ambulatory Visit (HOSPITAL_COMMUNITY)
Admission: RE | Admit: 2019-01-11 | Discharge: 2019-01-11 | Disposition: A | Discharge status: Home / Self Care | Payer: PPO | Source: Ambulatory Visit | Attending: Surgery | Admitting: Surgery

## 2019-01-11 ENCOUNTER — Ambulatory Visit (INDEPENDENT_AMBULATORY_CARE_PROVIDER_SITE_OTHER): Payer: Self-pay

## 2019-01-11 ENCOUNTER — Ambulatory Visit (INDEPENDENT_AMBULATORY_CARE_PROVIDER_SITE_OTHER): Payer: PPO

## 2019-01-11 ENCOUNTER — Ambulatory Visit (INDEPENDENT_AMBULATORY_CARE_PROVIDER_SITE_OTHER): Payer: PPO | Admitting: Surgery

## 2019-01-11 ENCOUNTER — Encounter (INDEPENDENT_AMBULATORY_CARE_PROVIDER_SITE_OTHER): Payer: Self-pay | Admitting: Surgery

## 2019-01-11 ENCOUNTER — Other Ambulatory Visit: Payer: Self-pay

## 2019-01-11 VITALS — BP 183/85 | HR 68 | Ht 62.0 in | Wt 200.0 lb

## 2019-01-11 DIAGNOSIS — M1711 Unilateral primary osteoarthritis, right knee: Secondary | ICD-10-CM

## 2019-01-11 DIAGNOSIS — M25562 Pain in left knee: Secondary | ICD-10-CM | POA: Diagnosis not present

## 2019-01-11 DIAGNOSIS — M25561 Pain in right knee: Secondary | ICD-10-CM | POA: Diagnosis not present

## 2019-01-11 DIAGNOSIS — M79662 Pain in left lower leg: Secondary | ICD-10-CM

## 2019-01-11 NOTE — Progress Notes (Signed)
Left lower extremity venous duplex completed. Preliminary results in Chart review CV proc. [place] Breda Bond,RVS 01/11/2019, 2:32 PM

## 2019-01-11 NOTE — Progress Notes (Signed)
Office Visit Note   Patient: Karina Davis           Date of Birth: 11-24-1945           MRN: [id number] Visit Date: 01/11/2019              Requested by: Harrison Mons, [address] [place], [place] [address] PCP: Harrison Mons, PA   Assessment & Plan: Visit Diagnoses:  1. Acute pain of both knees   2. Pain of left calf   3. Arthritis of right knee     Plan: With patient's worsening left calf pain I do recommend getting a venous Doppler today to rule out DVT.  I will await callback report.  In hopes of giving patient some relief of her left knee pain also offered injection.  After patient consent left knee was prepped with Betadine and intra-articular Marcaine/Depo-Medrol injection was performed.  No complications.  I advised patient to strictly monitor her blood sugars and she understands that today's injection could cause blood sugar elevation.  Follow-Up Instructions: Return in about 3 weeks (around 02/01/2019) for With Dr. Lorin Mercy.   Orders:  Orders Placed This Encounter  Procedures  . XR KNEE 3 VIEW LEFT  . XR KNEE 3 VIEW RIGHT   No orders of the defined types were placed in this encounter.     Procedures: No procedures performed   Clinical Data: No additional findings.   Subjective: Chief Complaint  Patient presents with  . Right Knee - Pain  . Left Knee - Pain    HPI 73 year old female comes in today with complaints of left greater than right knee pain.  Patient states that she is had chronic right knee pain for several years and this has not really changed much.  Main concern is with left knee pain that started a couple weeks ago when she got up from her desk and hit her knee on the desk.  She did have an ongoing pain with ambulation.  Question some feeling mechanical symptoms.  No instability.  Has had some knee swelling and tightness.  She also complains of pain into the left calf.  No complaints of chest pain shortness of breath.  No  lumbar spine hip or radicular component. Review of Systems No current cardiac pulmonary GI GU issues  Objective: Vital Signs: BP (!) 183/85   Pulse 68   Ht 5\' 2"  (1.575 m)   Wt 200 lb (90.7 kg)   BMI 36.58 kg/m   Physical Exam HENT:     Head: Normocephalic and atraumatic.     Mouth/Throat:     Mouth: Mucous membranes are dry.  Eyes:     Extraocular Movements: Extraocular movements intact.     Pupils: Pupils are equal, round, and reactive to light.  Pulmonary:     Effort: Pulmonary effort is normal.  Musculoskeletal:     Comments: Gait is antalgic.  Negative logroll bilateral hips.  Left knee good range of motion.  Medial lateral joint line tenderness.  Pain with McMurray's testing.  Some posterior knee swelling.  Tender Baker's cyst.  Moderate to marked left calf tenderness.  Right knee medial lateral joint line tenderness.  Mild patellofemoral crepitus.  Neurological:     General: No focal deficit present.     Mental Status: She is alert.  Psychiatric:        Mood and Affect: Mood normal.     Ortho Exam  Specialty Comments:  No specialty comments  available.  Imaging: No results found.   PMFS History: Patient Active Problem List   Diagnosis Date Noted  . Class 3 severe obesity due to excess calories with serious comorbidity and body mass index (BMI) of 40.0 to 44.9 in adult ([place]) 08/05/2018  . Body mass index (BMI) of 40.0 to 44.9 in adult ([place]) 08/05/2018  . Spinal stenosis of lumbar region 09/14/2017  . Osteopenia 09/03/2017  . OSA (obstructive sleep apnea) 05/03/2017  . Diabetic neuropathy ([place]) 03/30/2017  . Carious teeth 01/04/2014  . HTN (hypertension) 06/29/2013  . DM (diabetes mellitus), type 2 ([place]) 06/29/2013  . Hyperlipidemia with target LDL less than 70 06/29/2013  . BMI 37.0-37.9, adult    Past Medical History:  Diagnosis Date  . Ankle fracture   . Diabetes mellitus without complication ([place])   . Diabetic neuropathy ([place]) 03/30/2017  .  Hypertension   . Neuropathy   . Obesity     Family History  Problem Relation Age of Onset  . Cancer Mother 33       Colon  . Colon cancer Mother 47  . Stroke Father 46  . Diabetes Father   . Heart disease Father   . Hypertension Father   . Hypertension Sister   . Diabetes Sister   . Kidney disease Sister        due to HTN and DM  . Hypertension Brother   . Stroke Brother   . Hypertension Daughter   . Diabetes Daughter   . Obesity Daughter        gastric sleeve surgery 03/05/2014  . Hypertension Sister   . Drug abuse Sister        narcotic abuse  . Stroke Sister        associated with narcotic abuse  . Diabetes Sister   . Hypertension Sister   . Hyperlipidemia Sister   . Hypertension Brother   . Drug abuse Son        cocaine  . Stroke Brother   . Hypertension Brother     Past Surgical History:  Procedure Laterality Date  . APPENDECTOMY  1955   childhood  . COLON SURGERY     Social History   Occupational History  . Occupation: Realtor  Tobacco Use  . Smoking status: Never Smoker  . Smokeless tobacco: Never Used  Substance and Sexual Activity  . Alcohol use: Yes    Comment: Socially  . Drug use: No  . Sexual activity: Not Currently

## 2019-01-11 NOTE — Telephone Encounter (Signed)
[place] at [facility] called stating that patient is Negative for DVT, left leg.  Stated that patient has a small collection of fluid behind the left knee.

## 2019-01-30 ENCOUNTER — Telehealth (INDEPENDENT_AMBULATORY_CARE_PROVIDER_SITE_OTHER): Payer: Self-pay | Admitting: Radiology

## 2019-01-30 NOTE — Telephone Encounter (Signed)
I called patient and confirmed appointment for 01/31/2019 at 1:15p.  Patient answered "No" to all COVID-19 screening questions.

## 2019-01-31 ENCOUNTER — Ambulatory Visit (INDEPENDENT_AMBULATORY_CARE_PROVIDER_SITE_OTHER): Payer: PPO | Admitting: Orthopaedic Surgery

## 2019-01-31 ENCOUNTER — Encounter (INDEPENDENT_AMBULATORY_CARE_PROVIDER_SITE_OTHER): Payer: Self-pay | Admitting: Orthopaedic Surgery

## 2019-01-31 ENCOUNTER — Other Ambulatory Visit: Payer: Self-pay

## 2019-01-31 DIAGNOSIS — M25562 Pain in left knee: Secondary | ICD-10-CM

## 2019-01-31 NOTE — Progress Notes (Signed)
73 year old white female history of left knee pain returns.  States that left knee intra-articular Marcaine/Depo-Medrol injection performed last office visit gave some relief for a couple of weeks.  Pain has returned with some feeling mechanical symptoms.  Knee gets tight posterior aspect.  Exam Pleasant white female alert and oriented in no acute distress.  Gait is somewhat antalgic.  Left knee good range of motion.  Some swelling without large effusion.  Medial lateral joint line tenderness.  Some discomfort with McMurray's testing.  Ligament stable.    Plan With patient's ongoing symptoms and failed conservative treatment we will get left knee MRI to rule out meniscal tear and other knee pathology.  Patient will call me 2 days after completion of her study and I will give results over phone.  All questions answered.  Can continue home conservative management.

## 2019-02-02 DIAGNOSIS — I10 Essential (primary) hypertension: Secondary | ICD-10-CM | POA: Diagnosis not present

## 2019-02-02 DIAGNOSIS — Z6841 Body Mass Index (BMI) 40.0 and over, adult: Secondary | ICD-10-CM | POA: Diagnosis not present

## 2019-02-02 DIAGNOSIS — E1159 Type 2 diabetes mellitus with other circulatory complications: Secondary | ICD-10-CM | POA: Diagnosis not present

## 2019-02-02 DIAGNOSIS — E785 Hyperlipidemia, unspecified: Secondary | ICD-10-CM | POA: Diagnosis not present

## 2019-02-02 DIAGNOSIS — E1140 Type 2 diabetes mellitus with diabetic neuropathy, unspecified: Secondary | ICD-10-CM | POA: Diagnosis not present

## 2019-02-02 DIAGNOSIS — K029 Dental caries, unspecified: Secondary | ICD-10-CM | POA: Diagnosis not present

## 2019-02-02 DIAGNOSIS — E039 Hypothyroidism, unspecified: Secondary | ICD-10-CM | POA: Diagnosis not present

## 2019-02-02 DIAGNOSIS — E1142 Type 2 diabetes mellitus with diabetic polyneuropathy: Secondary | ICD-10-CM | POA: Diagnosis not present

## 2019-02-02 DIAGNOSIS — E1169 Type 2 diabetes mellitus with other specified complication: Secondary | ICD-10-CM | POA: Diagnosis not present

## 2019-02-03 ENCOUNTER — Ambulatory Visit (INDEPENDENT_AMBULATORY_CARE_PROVIDER_SITE_OTHER): Payer: PPO | Admitting: Orthopaedic Surgery

## 2019-03-03 ENCOUNTER — Encounter: Payer: Self-pay | Admitting: Gastroenterology

## 2019-03-13 ENCOUNTER — Encounter: Payer: Self-pay | Admitting: Gastroenterology

## 2019-03-17 ENCOUNTER — Encounter: Payer: Self-pay | Admitting: Gastroenterology

## 2019-03-21 ENCOUNTER — Telehealth: Payer: Self-pay

## 2019-03-21 NOTE — Telephone Encounter (Signed)
error 

## 2019-03-22 DIAGNOSIS — S83282A Other tear of lateral meniscus, current injury, left knee, initial encounter: Secondary | ICD-10-CM | POA: Diagnosis not present

## 2019-03-22 DIAGNOSIS — M25462 Effusion, left knee: Secondary | ICD-10-CM | POA: Diagnosis not present

## 2019-03-22 DIAGNOSIS — S83411A Sprain of medial collateral ligament of right knee, initial encounter: Secondary | ICD-10-CM | POA: Diagnosis not present

## 2019-03-29 ENCOUNTER — Other Ambulatory Visit: Payer: Self-pay

## 2019-03-29 ENCOUNTER — Ambulatory Visit: Payer: PPO

## 2019-03-29 VITALS — Ht 61.5 in | Wt 180.0 lb

## 2019-03-29 DIAGNOSIS — Z800 Family history of malignant neoplasm of digestive organs: Secondary | ICD-10-CM

## 2019-03-29 DIAGNOSIS — Z86010 Personal history of colonic polyps: Secondary | ICD-10-CM

## 2019-03-29 NOTE — Progress Notes (Signed)
No egg or soy allergy known to patient  No issues with past sedation with any surgeries  or procedures, no intubation problems  No diet pills per patient No home 02 use per patient  No blood thinners per patient  Pt denies issues with constipation  No A fib or A flutter  EMMI video sent to pt's e mail  

## 2019-03-31 ENCOUNTER — Encounter: Payer: Self-pay | Admitting: Orthopaedic Surgery

## 2019-03-31 ENCOUNTER — Other Ambulatory Visit: Payer: Self-pay

## 2019-03-31 ENCOUNTER — Ambulatory Visit (INDEPENDENT_AMBULATORY_CARE_PROVIDER_SITE_OTHER): Payer: PPO | Admitting: Orthopaedic Surgery

## 2019-03-31 VITALS — Ht 61.5 in | Wt 180.0 lb

## 2019-03-31 DIAGNOSIS — M2342 Loose body in knee, left knee: Secondary | ICD-10-CM

## 2019-03-31 DIAGNOSIS — S83282D Other tear of lateral meniscus, current injury, left knee, subsequent encounter: Secondary | ICD-10-CM | POA: Diagnosis not present

## 2019-03-31 NOTE — Progress Notes (Signed)
Office Visit Note   Patient: Karina Davis           Date of Birth: [birth year]           MRN: [id number] Visit Date: 03/31/2019              Requested by: Harrison Mons, [address] [place], [place] [id number] PCP: Harrison Mons, PA   Assessment & Plan: Visit Diagnoses:  1. Chondral loose body of left knee joint   2. Tear of lateral meniscus of left knee, current, unspecified tear type, subsequent encounter     Plan: Patient is having persistent mechanical symptoms with knee locking catching and associated effusion.  A portion of her pain may be coming from chondral wear but she does have loose piece loose body as well as posterior lateral meniscal tear involving the posterior lateral meniscus.  Plan will be outpatient arthroscopy with debridement lateral meniscal tear removal of loose body.  Procedure discussed she is concerned about falling and states she is not able to ambulate well the way her knee currently is.  Plain radiographs do not show significant enough knee arthritis to consider total knee arthroplasty at this time.  Procedure discussed questions elicited and answered she understands and requests we proceed.  Follow-Up Instructions: No follow-ups on file.   Orders:  No orders of the defined types were placed in this encounter.  No orders of the defined types were placed in this encounter.     Procedures: No procedures performed   Clinical Data: No additional findings.   Subjective: Chief Complaint  Patient presents with  . Left Knee - Follow-up    MRI Left Knee Review    HPI 73 year old female returns with intermittent sharp pain in her knee with catches.  She points to the lateral joint line where she has pain.  Knee has been swelling particularly after her knee catches with significant increased pain.  She has been treated with Aleve, ice, topical cream without relief.  Symptoms have been present for more than 3 months.  She did not  get relief with intra-articular injection performed back in March.  Patient states she is having trouble walking in the community.  She is concerned about the sharp catching in her knee and then severe pain associated with swelling after each episode.  Review of Systems positive for overweight with BMI currently 33 previously greater than 40.  Positive hyperlipidemia, sleep apnea, lumbar spinal stenosis.  Hypertension type 2 diabetes on oral medication.  Last A1c was 6.7.   Objective: Vital Signs: Ht 5' 1.5" (1.562 m)   Wt 180 lb (81.6 kg)   BMI 33.46 kg/m   Physical Exam Constitutional:      Appearance: She is well-developed.  HENT:     Head: Normocephalic.     Right Ear: External ear normal.     Left Ear: External ear normal.  Eyes:     Pupils: Pupils are equal, round, and reactive to light.  Neck:     Thyroid: No thyromegaly.     Trachea: No tracheal deviation.  Cardiovascular:     Rate and Rhythm: Normal rate.  Pulmonary:     Effort: Pulmonary effort is normal.  Abdominal:     Palpations: Abdomen is soft.  Skin:    General: Skin is warm and dry.  Neurological:     Mental Status: She is alert and oriented to person, place, and time.  Psychiatric:  Behavior: Behavior normal.     Ortho Exam patient has pain with hyperextension but does reach full extension.  She has lateral and posterior lateral joint line tenderness.  With flexion-extension today she did not lock while in the clinic.  She is amatory with a limp.  Negative logroll to the hips negative straight leg raising 90 degrees knee and ankle jerk are intact distal pulses are normal negative Homan no calf tenderness no venous stasis changes.  Anterior tib gastrocsoleus is strong.  Pain with popliteal palpation at the posterior lateral joint line.  Specialty Comments:  No specialty comments available.  Imaging: MRI scan left knee done on 03/15/19  Impression large radial tear of the posterior horn/root of the  lateral meniscus.  2.  Grade 1 MCL sprain  3.  Large joint effusion chondral loose body posteriorly to the lateral femoral condyle.  4.  High-grade cartilage loss in the posterior aspect the lateral femoral and tibial condyles and the patella.  Marcial Pacas, MD PMFS History: Patient Active Problem List   Diagnosis Date Noted  . Class 3 severe obesity due to excess calories with serious comorbidity and body mass index (BMI) of 40.0 to 44.9 in adult ([place]) 08/05/2018  . Body mass index (BMI) of 40.0 to 44.9 in adult ([place]) 08/05/2018  . Spinal stenosis of lumbar region 09/14/2017  . Osteopenia 09/03/2017  . OSA (obstructive sleep apnea) 05/03/2017  . Diabetic neuropathy ([place]) 03/30/2017  . Carious teeth 01/04/2014  . HTN (hypertension) 06/29/2013  . DM (diabetes mellitus), type 2 ([place]) 06/29/2013  . Hyperlipidemia with target LDL less than 70 06/29/2013  . BMI 37.0-37.9, adult    Past Medical History:  Diagnosis Date  . Ankle fracture   . Diabetes mellitus without complication ([place])   . Diabetic neuropathy ([place]) 03/30/2017  . GERD (gastroesophageal reflux disease)   . Hypertension   . Neuropathy   . Obesity   . Sleep apnea    cpap    Family History  Problem Relation Age of Onset  . Cancer Mother 87       Colon  . Colon cancer Mother 31  . Stroke Father 44  . Diabetes Father   . Heart disease Father   . Hypertension Father   . Hypertension Sister   . Diabetes Sister   . Kidney disease Sister        due to HTN and DM  . Hypertension Brother   . Stroke Brother   . Hypertension Daughter   . Diabetes Daughter   . Obesity Daughter        gastric sleeve surgery 03/05/2014  . Hypertension Sister   . Drug abuse Sister        narcotic abuse  . Stroke Sister        associated with narcotic abuse  . Diabetes Sister   . Hypertension Sister   . Hyperlipidemia Sister   . Hypertension Brother   . Drug abuse Son        cocaine  . Stroke Brother   . Hypertension Brother    . Esophageal cancer Neg Hx   . Rectal cancer Neg Hx   . Stomach cancer Neg Hx     Past Surgical History:  Procedure Laterality Date  . APPENDECTOMY  1955   childhood  . COLON SURGERY     Social History   Occupational History  . Occupation: Realtor  Tobacco Use  . Smoking status: Never Smoker  . Smokeless tobacco: Never Used  Substance and Sexual Activity  . Alcohol use: Yes    Comment: Socially  . Drug use: No  . Sexual activity: Not Currently

## 2019-04-11 ENCOUNTER — Telehealth: Payer: Self-pay | Admitting: Gastroenterology

## 2019-04-11 NOTE — Telephone Encounter (Signed)

## 2019-04-12 ENCOUNTER — Other Ambulatory Visit: Payer: Self-pay

## 2019-04-12 ENCOUNTER — Encounter: Payer: Self-pay | Admitting: Gastroenterology

## 2019-04-12 ENCOUNTER — Ambulatory Visit (AMBULATORY_SURGERY_CENTER): Payer: PPO | Admitting: Gastroenterology

## 2019-04-12 VITALS — BP 111/61 | HR 71 | Temp 99.3°F | Resp 13 | Ht 61.5 in | Wt 180.0 lb

## 2019-04-12 DIAGNOSIS — D122 Benign neoplasm of ascending colon: Secondary | ICD-10-CM | POA: Diagnosis not present

## 2019-04-12 DIAGNOSIS — G4733 Obstructive sleep apnea (adult) (pediatric): Secondary | ICD-10-CM | POA: Diagnosis not present

## 2019-04-12 DIAGNOSIS — D128 Benign neoplasm of rectum: Secondary | ICD-10-CM | POA: Diagnosis not present

## 2019-04-12 DIAGNOSIS — Z86010 Personal history of colonic polyps: Secondary | ICD-10-CM | POA: Diagnosis not present

## 2019-04-12 DIAGNOSIS — I10 Essential (primary) hypertension: Secondary | ICD-10-CM | POA: Diagnosis not present

## 2019-04-12 DIAGNOSIS — Z800 Family history of malignant neoplasm of digestive organs: Secondary | ICD-10-CM | POA: Diagnosis not present

## 2019-04-12 DIAGNOSIS — D129 Benign neoplasm of anus and anal canal: Secondary | ICD-10-CM

## 2019-04-12 DIAGNOSIS — E119 Type 2 diabetes mellitus without complications: Secondary | ICD-10-CM | POA: Diagnosis not present

## 2019-04-12 DIAGNOSIS — D123 Benign neoplasm of transverse colon: Secondary | ICD-10-CM

## 2019-04-12 MED ORDER — SODIUM CHLORIDE 0.9 % IV SOLN: 500.0000 mL | Freq: Once | INTRAVENOUS | Status: DC

## 2019-04-12 NOTE — Patient Instructions (Signed)
Handouts given polyps, diverticulosis, hemorrhoids, and high fiber diet.  YOU HAD AN ENDOSCOPIC PROCEDURE TODAY AT [facility]:   Refer to the procedure report that was given to you for any specific questions about what was found during the examination.  If the procedure report does not answer your questions, please call your gastroenterologist to clarify.  If you requested that your care partner not be given the details of your procedure findings, then the procedure report has been included in a sealed envelope for you to review at your convenience later.  YOU SHOULD EXPECT: Some feelings of bloating in the abdomen. Passage of more gas than usual.  Walking can help get rid of the air that was put into your GI tract during the procedure and reduce the bloating. If you had a lower endoscopy (such as a colonoscopy or flexible sigmoidoscopy) you may notice spotting of blood in your stool or on the toilet paper. If you underwent a bowel prep for your procedure, you may not have a normal bowel movement for a few days.  Please Note:  You might notice some irritation and congestion in your nose or some drainage.  This is from the oxygen used during your procedure.  There is no need for concern and it should clear up in a day or so.  SYMPTOMS TO REPORT IMMEDIATELY:   Following lower endoscopy (colonoscopy or flexible sigmoidoscopy):  Excessive amounts of blood in the stool  Significant tenderness or worsening of abdominal pains  Swelling of the abdomen that is new, acute  Fever of 100F or higher  For urgent or emergent issues, a gastroenterologist can be reached at any hour by calling [phone].   DIET:  We do recommend a small meal at first, but then you may proceed to your regular diet.  Drink plenty of fluids but you should avoid alcoholic beverages for 24 hours.  ACTIVITY:  You should plan to take it easy for the rest of today and you should NOT DRIVE or use heavy machinery  until tomorrow (because of the sedation medicines used during the test).    FOLLOW UP: Our staff will call the number listed on your records 48-72 hours following your procedure to check on you and address any questions or concerns that you may have regarding the information given to you following your procedure. If we do not reach you, we will leave a message.  We will attempt to reach you two times.  During this call, we will ask if you have developed any symptoms of COVID 19. If you develop any symptoms (ie: fever, flu-like symptoms, shortness of breath, cough etc.) before then, please call [phone].  If you test positive for Covid 19 in the 2 weeks post procedure, please call and report this information to [place].    If any biopsies were taken you will be contacted by phone or by letter within the next 1-3 weeks.  Please call us at [phone] if you have not heard about the biopsies in 3 weeks.    SIGNATURES/CONFIDENTIALITY: You and/or your care partner have signed paperwork which will be entered into your electronic medical record.  These signatures attest to the fact that that the information above on your After Visit Summary has been reviewed and is understood.  Full responsibility of the confidentiality of this discharge information lies with you and/or your care-partner.

## 2019-04-12 NOTE — Op Note (Signed)
[facility] Patient Name: Karina Davis Procedure Date: 04/12/2019 12:54 PM MRN: [id number] Endoscopist: Thornton Park MD, MD Age: 73 Referring MD:  Date of Birth: 06/13/1946 Gender: Female Account #: [id number] Procedure:                Colonoscopy Indications:              Surveillance: Personal history of adenomatous                            polyps on last colonoscopy 5 years ago                           Mother with colon cancer in her early 67s. Brother                            with colon polyps. No baseline GI symptoms.                           Colonoscopy 02/14/14 with Dr. Deatra Ina: 2 transverse                            colon tubular adenomas, diverticulosis Medicines:                See the Anesthesia note for documentation of the                            administered medications Procedure:                Pre-Anesthesia Assessment:                           - Prior to the procedure, a History and Physical                            was performed, and patient medications and                            allergies were reviewed. The patient's tolerance of                            previous anesthesia was also reviewed. The risks                            and benefits of the procedure and the sedation                            options and risks were discussed with the patient.                            All questions were answered, and informed consent                            was obtained. Prior Anticoagulants: The patient has  taken no previous anticoagulant or antiplatelet                            agents. ASA Grade Assessment: III - A patient with                            severe systemic disease. After reviewing the risks                            and benefits, the patient was deemed in                            satisfactory condition to undergo the procedure.                           After obtaining informed consent, the  colonoscope                            was passed under direct vision. Throughout the                            procedure, the patient's blood pressure, pulse, and                            oxygen saturations were monitored continuously. The                            Colonoscope was introduced through the anus and                            advanced to the the terminal ileum, with                            identification of the appendiceal orifice and IC                            valve. The colonoscopy was performed without                            difficulty. The patient tolerated the procedure                            well. The quality of the bowel preparation was                            excellent. The terminal ileum, ileocecal valve,                            appendiceal orifice, and rectum were photographed. Scope In: 1:02:32 PM Scope Out: 1:25:57 PM Scope Withdrawal Time: 0 hours 18 minutes 31 seconds  Total Procedure Duration: 0 hours 23 minutes 25 seconds  Findings:                 The perianal and digital rectal examinations were  normal.                           Many small and large-mouthed diverticula were found                            in the left colon.                           Three sessile polyps were found in the rectum and                            ascending colon. The polyps were 2 to 3 mm in size.                            These polyps were removed with a cold snare.                            Resection and retrieval were complete.                           Internal hemorrhoids were found. The hemorrhoids                            were small.                           The exam was otherwise without abnormality on                            direct and retroflexion views. Complications:            No immediate complications. Estimated blood loss:                            Minimal. Estimated Blood Loss:     Estimated blood  loss was minimal. Impression:               - Diverticulosis in the left colon.                           - Three 2 to 3 mm polyps in the rectum and in the                            ascending colon, removed with a cold snare.                            Resected and retrieved.                           - Internal hemorrhoids.                           - The examination was otherwise normal on direct  and retroflexion views. Recommendation:           - Patient has a contact number available for                            emergencies. The signs and symptoms of potential                            delayed complications were discussed with the                            patient. Return to normal activities tomorrow.                            Written discharge instructions were provided to the                            patient.                           - Resume regular diet today. High fiber diet.                           - Continue present medications.                           - Await pathology results.                           - Repeat colonoscopy in 3 years if all 3 polyps are                            adenomatous for surveillance based on pathology                            results. Otherwise, consider surveillance                            colonoscopy in 7 years if it is clinically                            appropriate at that time. Thornton Park MD, MD 04/12/2019 1:33:49 PM This report has been signed electronically.

## 2019-04-12 NOTE — Progress Notes (Signed)
Report to PACU, RN, vss, BBS= Clear.  

## 2019-04-13 DIAGNOSIS — K635 Polyp of colon: Secondary | ICD-10-CM | POA: Insufficient documentation

## 2019-04-13 DIAGNOSIS — K648 Other hemorrhoids: Secondary | ICD-10-CM | POA: Insufficient documentation

## 2019-04-13 DIAGNOSIS — K5790 Diverticulosis of intestine, part unspecified, without perforation or abscess without bleeding: Secondary | ICD-10-CM | POA: Insufficient documentation

## 2019-04-14 ENCOUNTER — Telehealth: Payer: Self-pay

## 2019-04-14 NOTE — Telephone Encounter (Signed)
  Follow up Call-  Call back number 04/12/2019  Post procedure Call Back phone  # [phone]  Permission to leave phone message No  comments pt. states she will answer  Some recent data might be hidden     Patient questions:  Do you have a fever, pain , or abdominal swelling? No. Pain Score  0 *  Have you tolerated food without any problems? Yes.    Have you been able to return to your normal activities? Yes.    Do you have any questions about your discharge instructions: Diet   No. Medications  No. Follow up visit  No.  Do you have questions or concerns about your Care? No.  Actions: * If pain score is 4 or above: No action needed, pain <4.  1. Have you developed a fever since your procedure? no  2.   Have you had an respiratory symptoms (SOB or cough) since your procedure? no  3.   Have you tested positive for COVID 19 since your procedure no  4.   Have you had any family members/close contacts diagnosed with the COVID 19 since your procedure?  no   If yes to any of these questions please route to Joylene John, RN and Alphonsa Gin, Therapist, sports.

## 2019-04-17 ENCOUNTER — Encounter: Payer: Self-pay | Admitting: Gastroenterology

## 2019-05-11 ENCOUNTER — Other Ambulatory Visit (HOSPITAL_COMMUNITY)
Admission: RE | Admit: 2019-05-11 | Discharge: 2019-05-11 | Disposition: A | Discharge status: Home / Self Care | Payer: PPO | Source: Ambulatory Visit | Attending: Orthopaedic Surgery | Admitting: Orthopaedic Surgery

## 2019-05-11 DIAGNOSIS — Z1159 Encounter for screening for other viral diseases: Secondary | ICD-10-CM | POA: Insufficient documentation

## 2019-05-11 LAB — SARS CORONAVIRUS 2 (TAT 6-24 HRS): SARS Coronavirus 2: NEGATIVE

## 2019-05-11 NOTE — Progress Notes (Signed)
[facility] # [place], [address] [place] [address] Phone: [phone] Fax: [phone]       Your procedure is scheduled on July 20th.  Report to [facility] Main Entrance "A" at 8:40 A.M., and check in at the Admitting office.  Call this number if you have problems the morning of surgery:  [phone]  Call [phone] if you have any questions prior to your surgery date Monday-Friday 8am-4pm    Remember:  Do not eat after midnight the night before your surgery   Please complete your PRE-SURGERY G2 that was provided to you by 7:30 AM the morning of  surgery.  Please, if able, drink it in one setting. DO NOT SIP.   You may drink clear liquids until 7:30 AM the morning of your surgery.   Clear liquids allowed are: Water, Non-Citrus Juices (without pulp), Carbonated Beverages, Clear Tea, Black Coffee Only, and Gatorade    Take these medicines the morning of surgery with A SIP OF WATER   Tylenol - if needed  Nasal Spray  Gabapentin (Neurontin)    Follow your surgeon's instructions on when to stop Aspirin.  If no instructions were given by your surgeon then you will need to call the office to get those instructions.    7 days prior to surgery STOP taking any Aspirin (unless otherwise instructed by your surgeon), Aleve, Naproxen, Ibuprofen, Motrin, Advil, Goody's, BC's, all herbal medications, fish oil, and all vitamins, and all supplements.   WHAT DO I DO ABOUT MY DIABETES MEDICATION?   Marland Kitchen Do not take oral diabetes medicines (pills) the morning of surgery. - Metformin, Tradjenta   . The day of surgery, do not take other diabetes injectables, including Byetta (exenatide), Bydureon (exenatide ER), Victoza (liraglutide), or Trulicity (dulaglutide).    How to Manage Your Diabetes Before and After Surgery  Why is it important to control my blood sugar before and after surgery? . Improving blood sugar levels before  and after surgery helps healing and can limit problems. . A way of improving blood sugar control is eating a healthy diet by: o  Eating less sugar and carbohydrates o  Increasing activity/exercise o  Talking with your doctor about reaching your blood sugar goals . High blood sugars (greater than 180 mg/dL) can raise your risk of infections and slow your recovery, so you will need to focus on controlling your diabetes during the weeks before surgery. . Make sure that the doctor who takes care of your diabetes knows about your planned surgery including the date and location.  How do I manage my blood sugar before surgery? . Check your blood sugar at least 4 times a day, starting 2 days before surgery, to make sure that the level is not too high or low. o Check your blood sugar the morning of your surgery when you wake up and every 2 hours until you get to the Short Stay unit. . If your blood sugar is less than 70 mg/dL, you will need to treat for low blood sugar: o Do not take insulin. o Treat a low blood sugar (less than 70 mg/dL) with  cup of clear juice (cranberry or apple), 4 glucose tablets, OR glucose gel. o Recheck blood sugar in 15 minutes after treatment (to make sure it is greater than 70 mg/dL). If your blood sugar is not greater than 70 mg/dL on recheck, call [phone] for further instructions. . Report your blood sugar  to the short stay nurse when you get to Short Stay.  . If you are admitted to the hospital after surgery: o Your blood sugar will be checked by the staff and you will probably be given insulin after surgery (instead of oral diabetes medicines) to make sure you have good blood sugar levels. o The goal for blood sugar control after surgery is 80-180 mg/dL.    The Morning of Surgery  Do not wear jewelry, make-up or nail polish.  Do not wear lotions, powders, or perfumes, or deodorant  Do not shave 48 hours prior to surgery.   Do not bring valuables to the  hospital.  [facility] is not responsible for any belongings or valuables.  If you are a smoker, DO NOT Smoke 24 hours prior to surgery IF you wear a CPAP at night please bring your mask, tubing, and machine the morning of surgery   Remember that you must have someone to transport you home after your surgery, and remain with you for 24 hours if you are discharged the same day.   Contacts, glasses, hearing aids, dentures or bridgework may not be worn into surgery.    Leave your suitcase in the car.  After surgery it may be brought to your room.  For patients admitted to the hospital, discharge time will be determined by your treatment team.  Patients discharged the day of surgery will not be allowed to drive home.    Special instructions:   [facility]- Preparing For Surgery  Before surgery, you can play an important role. Because skin is not sterile, your skin needs to be as free of germs as possible. You can reduce the number of germs on your skin by washing with CHG (chlorahexidine gluconate) Soap before surgery.  CHG is an antiseptic cleaner which kills germs and bonds with the skin to continue killing germs even after washing.    Oral Hygiene is also important to reduce your risk of infection.  Remember - BRUSH YOUR TEETH THE MORNING OF SURGERY WITH YOUR REGULAR TOOTHPASTE  Please do not use if you have an allergy to CHG or antibacterial soaps. If your skin becomes reddened/irritated stop using the CHG.  Do not shave (including legs and underarms) for at least 48 hours prior to first CHG shower. It is OK to shave your face.  Please follow these instructions carefully.   1. Shower the NIGHT BEFORE SURGERY and the MORNING OF SURGERY with CHG Soap.   2. If you chose to wash your hair, wash your hair first as usual with your normal shampoo.  3. After you shampoo, rinse your hair and body thoroughly to remove the shampoo.  4. Use CHG as you would any other liquid soap. You can  apply CHG directly to the skin and wash gently with a scrungie or a clean washcloth.   5. Apply the CHG Soap to your body ONLY FROM THE NECK DOWN.  Do not use on open wounds or open sores. Avoid contact with your eyes, ears, mouth and genitals (private parts). Wash Face and genitals (private parts)  with your normal soap.   6. Wash thoroughly, paying special attention to the area where your surgery will be performed.  7. Thoroughly rinse your body with warm water from the neck down.  8. DO NOT shower/wash with your normal soap after using and rinsing off the CHG Soap.  9. Pat yourself dry with a CLEAN TOWEL.  10. Wear CLEAN PAJAMAS to bed the  night before surgery, wear comfortable clothes the morning of surgery  11. Place CLEAN SHEETS on your bed the night of your first shower and DO NOT SLEEP WITH PETS.    Day of Surgery:  Do not apply any deodorants/lotions. Please shower the morning of surgery with the CHG soap  Please wear clean clothes to the hospital/surgery center.   Remember to brush your teeth WITH YOUR REGULAR TOOTHPASTE.   Please read over the following fact sheets that you were given.

## 2019-05-12 ENCOUNTER — Other Ambulatory Visit: Payer: Self-pay

## 2019-05-12 ENCOUNTER — Telehealth: Payer: Self-pay | Admitting: Orthopaedic Surgery

## 2019-05-12 ENCOUNTER — Ambulatory Visit (HOSPITAL_COMMUNITY)
Admission: RE | Admit: 2019-05-12 | Discharge: 2019-05-12 | Disposition: A | Discharge status: Home / Self Care | Payer: PPO | Source: Ambulatory Visit | Attending: Surgery | Admitting: Surgery

## 2019-05-12 ENCOUNTER — Encounter (HOSPITAL_COMMUNITY): Payer: Self-pay

## 2019-05-12 ENCOUNTER — Encounter (HOSPITAL_COMMUNITY)
Admission: RE | Admit: 2019-05-12 | Discharge: 2019-05-12 | Disposition: A | Discharge status: Home / Self Care | Payer: PPO | Source: Ambulatory Visit | Attending: Orthopaedic Surgery | Admitting: Orthopaedic Surgery

## 2019-05-12 DIAGNOSIS — Z7982 Long term (current) use of aspirin: Secondary | ICD-10-CM | POA: Diagnosis not present

## 2019-05-12 DIAGNOSIS — I10 Essential (primary) hypertension: Secondary | ICD-10-CM | POA: Diagnosis not present

## 2019-05-12 DIAGNOSIS — G4733 Obstructive sleep apnea (adult) (pediatric): Secondary | ICD-10-CM | POA: Diagnosis not present

## 2019-05-12 DIAGNOSIS — Z7984 Long term (current) use of oral hypoglycemic drugs: Secondary | ICD-10-CM | POA: Insufficient documentation

## 2019-05-12 DIAGNOSIS — E1140 Type 2 diabetes mellitus with diabetic neuropathy, unspecified: Secondary | ICD-10-CM | POA: Diagnosis not present

## 2019-05-12 DIAGNOSIS — Z01818 Encounter for other preprocedural examination: Secondary | ICD-10-CM | POA: Diagnosis not present

## 2019-05-12 DIAGNOSIS — K219 Gastro-esophageal reflux disease without esophagitis: Secondary | ICD-10-CM | POA: Insufficient documentation

## 2019-05-12 DIAGNOSIS — M2342 Loose body in knee, left knee: Secondary | ICD-10-CM | POA: Diagnosis not present

## 2019-05-12 DIAGNOSIS — X58XXXA Exposure to other specified factors, initial encounter: Secondary | ICD-10-CM | POA: Diagnosis not present

## 2019-05-12 DIAGNOSIS — Z79899 Other long term (current) drug therapy: Secondary | ICD-10-CM | POA: Insufficient documentation

## 2019-05-12 DIAGNOSIS — S83282A Other tear of lateral meniscus, current injury, left knee, initial encounter: Secondary | ICD-10-CM | POA: Insufficient documentation

## 2019-05-12 LAB — COMPREHENSIVE METABOLIC PANEL
ALT: 23 U/L (ref 0–44)
AST: 25 U/L (ref 15–41)
Albumin: 3.8 g/dL (ref 3.5–5.0)
Alkaline Phosphatase: 75 U/L (ref 38–126)
Anion gap: 11 (ref 5–15)
BUN: 16 mg/dL (ref 8–23)
CO2: 25 mmol/L (ref 22–32)
Calcium: 10 mg/dL (ref 8.9–10.3)
Chloride: 104 mmol/L (ref 98–111)
Creatinine, Ser: 0.89 mg/dL (ref 0.44–1.00)
GFR calc Af Amer: >60 mL/min (ref >60)
GFR calc non Af Amer: >60 mL/min (ref >60)
Glucose, Bld: 159 mg/dL — ABNORMAL HIGH (ref 70–99)
Potassium: 3.8 mmol/L (ref 3.5–5.1)
Sodium: 140 mmol/L (ref 135–145)
Total Bilirubin: 0.5 mg/dL (ref 0.3–1.2)
Total Protein: 6.9 g/dL (ref 6.5–8.1)

## 2019-05-12 LAB — CBC
HCT: 39.4 % (ref 36.0–46.0)
Hemoglobin: 12.9 g/dL (ref 12.0–15.0)
MCH: 29.1 pg (ref 26.0–34.0)
MCHC: 32.7 g/dL (ref 30.0–36.0)
MCV: 88.9 fL (ref 80.0–100.0)
Platelets: 283 10*3/uL (ref 150–400)
RBC: 4.43 MIL/uL (ref 3.87–5.11)
RDW: 13.8 % (ref 11.5–15.5)
WBC: 11.2 10*3/uL — ABNORMAL HIGH (ref 4.0–10.5)
nRBC: 0 % (ref 0.0–0.2)

## 2019-05-12 LAB — SURGICAL PCR SCREEN
MRSA, PCR: NEGATIVE
Staphylococcus aureus: POSITIVE — AB

## 2019-05-12 LAB — HEMOGLOBIN A1C
Hgb A1c MFr Bld: 7 % — ABNORMAL HIGH (ref 4.8–5.6)
Mean Plasma Glucose: 154.2 mg/dL

## 2019-05-12 LAB — GLUCOSE, CAPILLARY: Glucose-Capillary: 145 mg/dL — ABNORMAL HIGH (ref 70–99)

## 2019-05-12 NOTE — Telephone Encounter (Signed)
PLEASE ADVISE.

## 2019-05-12 NOTE — Progress Notes (Signed)
PCP - Harrison Mons, PA Cardiologist - patient denies  Chest x-ray - 05/12/2019 EKG - 05/12/2019 Stress Test - patient denies ECHO - patient denies Cardiac Cath - patient denies  Sleep Study - patient unsure CPAP - yes  Fasting Blood Sugar - patient unsure, she does not check CBG often Checks Blood Sugar _____ times a day  Blood Thinner Instructions: n/a Aspirin Instructions: patient instructed to contact Dr. Lorin Mercy' office for instructions  Anesthesia review: n/a  Patient denies shortness of breath, fever, cough and chest pain at PAT appointment  Coronavirus Screening  Have you experienced the following symptoms:  Cough yes/no: No Fever (>100.55F)  yes/no: No Runny nose yes/no: No Sore throat yes/no: No Difficulty breathing/shortness of breath  yes/no: No  Have you or a family member traveled in the last 14 days and where? yes/no: No   If the patient indicates "YES" to the above questions, their PAT will be rescheduled to limit the exposure to others and, the surgeon will be notified. THE PATIENT WILL NEED TO BE ASYMPTOMATIC FOR 14 DAYS.   If the patient is not experiencing any of these symptoms, the PAT nurse will instruct them to NOT bring anyone with them to their appointment since they may have these symptoms or traveled as well.   Please remind your patients and families that hospital visitation restrictions are in effect and the importance of the restrictions.    Patient verbalized understanding of instructions that were given to them at the PAT appointment. Patient was also instructed that they will need to review over the PAT instructions again at home before surgery.

## 2019-05-12 NOTE — Telephone Encounter (Signed)
[facility] on [address] for folding walker 4 prong. Have them deliver to daughter house address below.

## 2019-05-12 NOTE — Anesthesia Preprocedure Evaluation (Addendum)
Anesthesia Evaluation  Patient identified by MRN, date of birth, ID band Patient awake    Reviewed: Allergy & Precautions, NPO status , Patient's Chart, lab work & pertinent test results  History of Anesthesia Complications Negative for: history of anesthetic complications  Airway Mallampati: III  TM Distance: >3 FB Neck ROM: Full    Dental  (+) Missing,    Pulmonary sleep apnea and Continuous Positive Airway Pressure Ventilation , neg recent URI,    breath sounds clear to auscultation       Cardiovascular hypertension, Pt. on medications (-) angina(-) Past MI and (-) CHF  Rhythm:Regular     Neuro/Psych negative neurological ROS  negative psych ROS   GI/Hepatic Neg liver ROS, GERD  Medicated and Controlled,  Endo/Other  diabetes, Type 2, Oral Hypoglycemic AgentsMorbid obesity  Renal/GU negative Renal ROS     Musculoskeletal LEFT KNEE LOOSE BODY, LATERAL MENISCAL TEAR   Abdominal   Peds  Hematology negative hematology ROS (+)   Anesthesia Other Findings   Reproductive/Obstetrics                            Anesthesia Physical Anesthesia Plan  ASA: III  Anesthesia Plan: General and Regional   Post-op Pain Management:  Regional for Post-op pain   Induction: Intravenous  PONV Risk Score and Plan: 3 and Ondansetron and Dexamethasone  Airway Management Planned: LMA and Oral ETT  Additional Equipment: None  Intra-op Plan:   Post-operative Plan: Extubation in OR  Informed Consent: I have reviewed the patients History and Physical, chart, labs and discussed the procedure including the risks, benefits and alternatives for the proposed anesthesia with the patient or authorized representative who has indicated his/her understanding and acceptance.     Dental advisory given  Plan Discussed with: CRNA and Surgeon  Anesthesia Plan Comments: (PAT note written 05/12/2019 by Myra Gianotti,  PA-C. )       Anesthesia Quick Evaluation

## 2019-05-12 NOTE — Telephone Encounter (Signed)
Patient called asked what will she need when she go home from the hospital (Shower seat) and some equipment to get around.  Patient said she will be staying with her daughter. The address is [address] [place] [address]. The number to contact patient is [phone]

## 2019-05-12 NOTE — Telephone Encounter (Signed)
Can you do this one for [name] from Dr. Lorin Mercy?  Thank you.

## 2019-05-12 NOTE — Progress Notes (Signed)
Anesthesia Chart Review:  Case: [id number] Date/Time: 05/15/19 1030   Procedure: LEFT KNEE ARTHOSCOPY PARTIAL MENISCECTOMY, REMOVAL OF LOOSE BODY (Left )   Anesthesia type: General   Pre-op diagnosis: LEFT KNEE LOOSE BODY, LATERAL MENISCAL TEAR   Location: [facility] OR ROOM 05 / [place]   Surgeon: Marybelle Killings, MD      DISCUSSION: Patient is a 73 year old female scheduled for the above procedure.  History includes never smoker, DM2 (with neuropathy), HTN, OSA (CPAP), GERD, colon surgery (not specified). BMI is consistent with morbid obesity.  Preoperative EKG and labs acceptable for OR.  She denied shortness of breath, chest pain, cough, fever at her PAT RN visit.  If no acute changes then I  anticipate that she can proceed as planned. 05/11/2019 presurgical cover test negative.   VS: BP (!) 152/84 Comment: taken manually  Pulse 75   Temp 36.5 C   Resp 20   Ht 5' 1.5" (1.562 m)   Wt 98.9 kg   SpO2 97%   BMI 40.52 kg/m    PROVIDERS: Harrison Mons, PA is PCP [facility] Everywhere)   LABS: Labs reviewed: Acceptable for surgery. A1c added to PAT labs since last result of 7.3 on 11/03/18 ([place]) is > 3 months ago--result showed A1c of 7.0%. (all labs ordered are listed, but only abnormal results are displayed)  Labs Reviewed  SURGICAL PCR SCREEN - Abnormal; Notable for the following components:      Result Value   Staphylococcus aureus POSITIVE (*)    All other components within normal limits  GLUCOSE, CAPILLARY - Abnormal; Notable for the following components:   Glucose-Capillary 145 (*)    All other components within normal limits  CBC - Abnormal; Notable for the following components:   WBC 11.2 (*)    All other components within normal limits  COMPREHENSIVE METABOLIC PANEL - Abnormal; Notable for the following components:   Glucose, Bld 159 (*)    All other components within normal limits  HEMOGLOBIN A1C - Abnormal; Notable for the following components:   Hgb A1c  MFr Bld 7.0 (*)    All other components within normal limits     IMAGES: CXR 05/12/19: FINDINGS: The heart size and mediastinal contours are within normal limits. Both lungs are clear. Small focal eventration of the right hemidiaphragm. Disc degenerative disease of the thoracic spine. IMPRESSION: No acute abnormality of the lungs.   EKG: 05/12/19: Normal sinus rhythm Nonspecific T wave abnormality Abnormal ECG   CV: N/A   Past Medical History:  Diagnosis Date  . Ankle fracture   . Diabetes mellitus without complication ([place])   . Diabetic neuropathy ([place]) 03/30/2017  . GERD (gastroesophageal reflux disease)   . Hypertension   . Neuropathy   . Obesity   . Sleep apnea    cpap    Past Surgical History:  Procedure Laterality Date  . APPENDECTOMY  1955   childhood  . COLON SURGERY      MEDICATIONS: . acetaminophen (TYLENOL) 650 MG CR tablet  . aspirin 81 MG tablet  . atorvastatin (LIPITOR) 40 MG tablet  . azelastine (ASTELIN) 0.1 % nasal spray  . b complex vitamins tablet  . calcium carbonate (TUMS - DOSED IN MG ELEMENTAL CALCIUM) 500 MG chewable tablet  . CALCIUM CITRATE-VITAMIN D PO  . chlorhexidine (PERIDEX) 0.12 % solution  . COD LIVER OIL W/VIT A & D PO  . Coenzyme Q10 (COQ-10) 100 MG CAPS  . cyclobenzaprine (FLEXERIL) 10 MG tablet  .  gabapentin (NEURONTIN) 600 MG tablet  . hydrochlorothiazide (HYDRODIURIL) 25 MG tablet  . linagliptin (TRADJENTA) 5 MG TABS tablet  . losartan (COZAAR) 100 MG tablet  . Magnesium 250 MG TABS  . meloxicam (MOBIC) 15 MG tablet  . metFORMIN (GLUCOPHAGE) 500 MG tablet  . MILK THISTLE PO  . Multiple Vitamins-Minerals (CENTRUM SILVER PO)  . naproxen sodium (ALEVE) 220 MG tablet  . omega-3 acid ethyl esters (LOVAZA) 1 g capsule  . Potassium 99 MG TABS  . TURMERIC PO  . vitamin C (ASCORBIC ACID) 500 MG tablet   No current facility-administered medications for this encounter.     Myra Gianotti, PA-C Surgical Short  Stay/Anesthesiology [facility] Phone [phone] [facility] Phone [phone] 05/12/2019 5:27 PM

## 2019-05-15 ENCOUNTER — Ambulatory Visit (HOSPITAL_COMMUNITY): Payer: PPO | Admitting: Certified Registered"

## 2019-05-15 ENCOUNTER — Encounter (HOSPITAL_COMMUNITY): Payer: Self-pay | Admitting: *Deleted

## 2019-05-15 ENCOUNTER — Other Ambulatory Visit: Payer: Self-pay

## 2019-05-15 ENCOUNTER — Ambulatory Visit (HOSPITAL_COMMUNITY): Payer: PPO | Admitting: Vascular Surgery

## 2019-05-15 ENCOUNTER — Ambulatory Visit (HOSPITAL_COMMUNITY)
Admission: RE | Admit: 2019-05-15 | Discharge: 2019-05-15 | Disposition: A | Discharge status: Home / Self Care | Payer: PPO | Attending: Orthopaedic Surgery | Admitting: Orthopaedic Surgery

## 2019-05-15 ENCOUNTER — Telehealth: Payer: Self-pay | Admitting: Orthopaedic Surgery

## 2019-05-15 ENCOUNTER — Encounter (HOSPITAL_COMMUNITY)
Admission: RE | Disposition: A | Discharge status: Home / Self Care | Payer: Self-pay | Source: Home / Self Care | Attending: Orthopaedic Surgery

## 2019-05-15 ENCOUNTER — Telehealth: Payer: Self-pay

## 2019-05-15 ENCOUNTER — Encounter: Payer: Self-pay | Admitting: Orthopaedic Surgery

## 2019-05-15 DIAGNOSIS — Z7984 Long term (current) use of oral hypoglycemic drugs: Secondary | ICD-10-CM | POA: Diagnosis not present

## 2019-05-15 DIAGNOSIS — E1140 Type 2 diabetes mellitus with diabetic neuropathy, unspecified: Secondary | ICD-10-CM | POA: Diagnosis not present

## 2019-05-15 DIAGNOSIS — G4733 Obstructive sleep apnea (adult) (pediatric): Secondary | ICD-10-CM | POA: Insufficient documentation

## 2019-05-15 DIAGNOSIS — Z6841 Body Mass Index (BMI) 40.0 and over, adult: Secondary | ICD-10-CM | POA: Diagnosis not present

## 2019-05-15 DIAGNOSIS — E119 Type 2 diabetes mellitus without complications: Secondary | ICD-10-CM | POA: Diagnosis not present

## 2019-05-15 DIAGNOSIS — K219 Gastro-esophageal reflux disease without esophagitis: Secondary | ICD-10-CM | POA: Insufficient documentation

## 2019-05-15 DIAGNOSIS — X58XXXA Exposure to other specified factors, initial encounter: Secondary | ICD-10-CM | POA: Insufficient documentation

## 2019-05-15 DIAGNOSIS — S83289A Other tear of lateral meniscus, current injury, unspecified knee, initial encounter: Secondary | ICD-10-CM | POA: Diagnosis present

## 2019-05-15 DIAGNOSIS — I10 Essential (primary) hypertension: Secondary | ICD-10-CM | POA: Diagnosis not present

## 2019-05-15 DIAGNOSIS — M94262 Chondromalacia, left knee: Secondary | ICD-10-CM | POA: Insufficient documentation

## 2019-05-15 DIAGNOSIS — S83282A Other tear of lateral meniscus, current injury, left knee, initial encounter: Secondary | ICD-10-CM | POA: Insufficient documentation

## 2019-05-15 DIAGNOSIS — G8918 Other acute postprocedural pain: Secondary | ICD-10-CM | POA: Diagnosis not present

## 2019-05-15 HISTORY — PX: KNEE ARTHROSCOPY WITH LATERAL MENISECTOMY: SHX6193

## 2019-05-15 LAB — GLUCOSE, CAPILLARY
Glucose-Capillary: 150 mg/dL — ABNORMAL HIGH (ref 70–99)
Glucose-Capillary: 138 mg/dL — ABNORMAL HIGH (ref 70–99)

## 2019-05-15 SURGERY — ARTHROSCOPY, KNEE, WITH LATERAL MENISCECTOMY
Anesthesia: Regional | Laterality: Left

## 2019-05-15 MED ORDER — OXYCODONE HCL 5 MG/5ML PO SOLN: 5.0000 mg | Freq: Once | ORAL | Status: AC | PRN

## 2019-05-15 MED ORDER — HYDROCODONE-ACETAMINOPHEN 5-325 MG PO TABS: 1.0000 | ORAL_TABLET | Freq: Four times a day (QID) | ORAL | 0 refills | Status: AC | PRN

## 2019-05-15 MED ORDER — ACETAMINOPHEN 160 MG/5ML PO SOLN: 1000.0000 mg | Freq: Once | ORAL | Status: DC | PRN

## 2019-05-15 MED ORDER — OXYCODONE HCL 5 MG PO TABS
ORAL_TABLET | ORAL | Status: AC
  Filled 2019-05-15: qty 1

## 2019-05-15 MED ORDER — EPHEDRINE 5 MG/ML INJ
INTRAVENOUS | Status: AC
  Filled 2019-05-15: qty 30

## 2019-05-15 MED ORDER — SODIUM CHLORIDE 0.9 % IR SOLN
Status: DC | PRN
  Administered 2019-05-15: 6000 mL

## 2019-05-15 MED ORDER — CEFAZOLIN SODIUM-DEXTROSE 2-4 GM/100ML-% IV SOLN
2.0000 g | INTRAVENOUS | Status: AC
  Administered 2019-05-15: 2 g via INTRAVENOUS

## 2019-05-15 MED ORDER — ONDANSETRON HCL 4 MG/2ML IJ SOLN
INTRAMUSCULAR | Status: DC | PRN
  Administered 2019-05-15: 4 mg via INTRAVENOUS

## 2019-05-15 MED ORDER — ROPIVACAINE HCL 7.5 MG/ML IJ SOLN
INTRAMUSCULAR | Status: DC | PRN
  Administered 2019-05-15: 20 mL via PERINEURAL

## 2019-05-15 MED ORDER — MIDAZOLAM HCL 2 MG/2ML IJ SOLN: 1.0000 mg | Freq: Once | INTRAMUSCULAR | Status: DC

## 2019-05-15 MED ORDER — SUGAMMADEX SODIUM 200 MG/2ML IV SOLN
INTRAVENOUS | Status: DC | PRN
  Administered 2019-05-15: 200 mg via INTRAVENOUS

## 2019-05-15 MED ORDER — ACETAMINOPHEN 10 MG/ML IV SOLN: 1000.0000 mg | Freq: Once | INTRAVENOUS | Status: DC | PRN

## 2019-05-15 MED ORDER — LIDOCAINE 2% (20 MG/ML) 5 ML SYRINGE
INTRAMUSCULAR | Status: AC
  Filled 2019-05-15: qty 5

## 2019-05-15 MED ORDER — OXYCODONE HCL 5 MG PO TABS
5.0000 mg | ORAL_TABLET | Freq: Once | ORAL | Status: AC | PRN
  Administered 2019-05-15: 5 mg via ORAL

## 2019-05-15 MED ORDER — DEXAMETHASONE SODIUM PHOSPHATE 10 MG/ML IJ SOLN
INTRAMUSCULAR | Status: DC | PRN
  Administered 2019-05-15: 10 mg via INTRAVENOUS

## 2019-05-15 MED ORDER — FENTANYL CITRATE (PF) 100 MCG/2ML IJ SOLN: 25.0000 ug | INTRAMUSCULAR | Status: DC | PRN

## 2019-05-15 MED ORDER — LIDOCAINE 2% (20 MG/ML) 5 ML SYRINGE
INTRAMUSCULAR | Status: DC | PRN
  Administered 2019-05-15: 60 mg via INTRAVENOUS

## 2019-05-15 MED ORDER — MIDAZOLAM HCL 2 MG/2ML IJ SOLN
INTRAMUSCULAR | Status: AC
  Administered 2019-05-15: 1 mg
  Filled 2019-05-15: qty 2

## 2019-05-15 MED ORDER — ROCURONIUM BROMIDE 10 MG/ML (PF) SYRINGE
PREFILLED_SYRINGE | INTRAVENOUS | Status: AC
  Filled 2019-05-15: qty 10

## 2019-05-15 MED ORDER — FENTANYL CITRATE (PF) 100 MCG/2ML IJ SOLN
INTRAMUSCULAR | Status: AC
  Administered 2019-05-15: 50 ug
  Filled 2019-05-15: qty 2

## 2019-05-15 MED ORDER — CHLORHEXIDINE GLUCONATE 4 % EX LIQD: 60.0000 mL | Freq: Once | CUTANEOUS | Status: DC

## 2019-05-15 MED ORDER — BUPIVACAINE-EPINEPHRINE (PF) 0.25% -1:200000 IJ SOLN
INTRAMUSCULAR | Status: AC
  Filled 2019-05-15: qty 30

## 2019-05-15 MED ORDER — HYDROCODONE-ACETAMINOPHEN 5-325 MG PO TABS: 1.0000 | ORAL_TABLET | Freq: Four times a day (QID) | ORAL | 0 refills | Status: DC | PRN

## 2019-05-15 MED ORDER — PROPOFOL 10 MG/ML IV BOLUS
INTRAVENOUS | Status: DC | PRN
  Administered 2019-05-15: 130 mg via INTRAVENOUS

## 2019-05-15 MED ORDER — PROPOFOL 10 MG/ML IV BOLUS
INTRAVENOUS | Status: AC
  Filled 2019-05-15: qty 20

## 2019-05-15 MED ORDER — ACETAMINOPHEN 500 MG PO TABS: 1000.0000 mg | ORAL_TABLET | Freq: Once | ORAL | Status: DC | PRN

## 2019-05-15 MED ORDER — CEFAZOLIN SODIUM-DEXTROSE 2-4 GM/100ML-% IV SOLN
INTRAVENOUS | Status: AC
  Filled 2019-05-15: qty 100

## 2019-05-15 MED ORDER — EPHEDRINE SULFATE-NACL 50-0.9 MG/10ML-% IV SOSY
PREFILLED_SYRINGE | INTRAVENOUS | Status: DC | PRN
  Administered 2019-05-15 (×2): 10 mg via INTRAVENOUS

## 2019-05-15 MED ORDER — LACTATED RINGERS IV SOLN
INTRAVENOUS | Status: DC
  Administered 2019-05-15: 09:00:00 via INTRAVENOUS

## 2019-05-15 MED ORDER — FENTANYL CITRATE (PF) 100 MCG/2ML IJ SOLN: 50.0000 ug | Freq: Once | INTRAMUSCULAR | Status: DC

## 2019-05-15 MED ORDER — ROCURONIUM BROMIDE 10 MG/ML (PF) SYRINGE
PREFILLED_SYRINGE | INTRAVENOUS | Status: DC | PRN
  Administered 2019-05-15: 60 mg via INTRAVENOUS

## 2019-05-15 MED ORDER — FENTANYL CITRATE (PF) 250 MCG/5ML IJ SOLN
INTRAMUSCULAR | Status: AC
  Filled 2019-05-15: qty 5

## 2019-05-15 MED ORDER — FENTANYL CITRATE (PF) 250 MCG/5ML IJ SOLN
INTRAMUSCULAR | Status: DC | PRN
  Administered 2019-05-15: 100 ug via INTRAVENOUS

## 2019-05-15 SURGICAL SUPPLY — 39 items
APL SKNCLS STERI-STRIP NONHPOA (GAUZE/BANDAGES/DRESSINGS) ×1
BANDAGE ACE 6X5 VEL STRL LF (GAUZE/BANDAGES/DRESSINGS) ×3 IMPLANT
BANDAGE ELASTIC 6 VELCRO ST LF (GAUZE/BANDAGES/DRESSINGS) ×4 IMPLANT
BENZOIN TINCTURE PRP APPL 2/3 (GAUZE/BANDAGES/DRESSINGS) ×3 IMPLANT
BLADE CUDA 5.5 (BLADE) IMPLANT
BLADE GREAT WHITE 4.2 (BLADE) ×2 IMPLANT
BLADE GREAT WHITE 4.2MM (BLADE) ×1
BOOTCOVER CLEANROOM LRG (PROTECTIVE WEAR) ×6 IMPLANT
BUR OVAL 6.0 (BURR) IMPLANT
CLOSURE STERI-STRIP 1/2X4 (GAUZE/BANDAGES/DRESSINGS) ×1
CLOSURE WOUND 1/2 X4 (GAUZE/BANDAGES/DRESSINGS) ×1
CLSR STERI-STRIP ANTIMIC 1/2X4 (GAUZE/BANDAGES/DRESSINGS) ×1 IMPLANT
COVER SURGICAL LIGHT HANDLE (MISCELLANEOUS) ×3 IMPLANT
COVER WAND RF STERILE (DRAPES) ×3 IMPLANT
CUFF TOURN SGL QUICK 34 (TOURNIQUET CUFF)
CUFF TOURN SGL QUICK 42 (TOURNIQUET CUFF) IMPLANT
CUFF TRNQT CYL 34X4.125X (TOURNIQUET CUFF) IMPLANT
DRAPE ARTHROSCOPY W/POUCH 114 (DRAPES) ×3 IMPLANT
DRSG PAD ABDOMINAL 8X10 ST (GAUZE/BANDAGES/DRESSINGS) ×3 IMPLANT
DRSG TEGADERM 4X4.75 (GAUZE/BANDAGES/DRESSINGS) ×2 IMPLANT
DURAPREP 26ML APPLICATOR (WOUND CARE) ×3 IMPLANT
GAUZE SPONGE 4X4 12PLY STRL (GAUZE/BANDAGES/DRESSINGS) ×3 IMPLANT
GAUZE XEROFORM 1X8 LF (GAUZE/BANDAGES/DRESSINGS) ×3 IMPLANT
GLOVE BIOGEL PI IND STRL 8 (GLOVE) ×1 IMPLANT
GLOVE BIOGEL PI INDICATOR 8 (GLOVE) ×2
GLOVE ORTHO TXT STRL SZ7.5 (GLOVE) ×3 IMPLANT
GOWN STRL REUS W/ TWL LRG LVL3 (GOWN DISPOSABLE) ×2 IMPLANT
GOWN STRL REUS W/TWL 2XL LVL3 (GOWN DISPOSABLE) IMPLANT
GOWN STRL REUS W/TWL LRG LVL3 (GOWN DISPOSABLE) ×6
KIT TURNOVER KIT B (KITS) ×3 IMPLANT
NDL HYPO 25GX1X1/2 BEV (NEEDLE) IMPLANT
NEEDLE HYPO 25GX1X1/2 BEV (NEEDLE) IMPLANT
PACK ARTHROSCOPY DSU (CUSTOM PROCEDURE TRAY) ×3 IMPLANT
PAD ARMBOARD 7.5X6 YLW CONV (MISCELLANEOUS) ×6 IMPLANT
PADDING CAST COTTON 6X4 STRL (CAST SUPPLIES) ×3 IMPLANT
SPONGE LAP 4X18 RFD (DISPOSABLE) ×3 IMPLANT
STRIP CLOSURE SKIN 1/2X4 (GAUZE/BANDAGES/DRESSINGS) ×2 IMPLANT
TOWEL GREEN STERILE FF (TOWEL DISPOSABLE) ×6 IMPLANT
TUBING ARTHROSCOPY IRRIG 16FT (MISCELLANEOUS) ×3 IMPLANT

## 2019-05-15 NOTE — Transfer of Care (Signed)
Immediate Anesthesia Transfer of Care Note  Patient: Karina Davis  Procedure(s) Performed: LEFT KNEE ARTHOSCOPY PARTIAL MENISCECTOMY, REMOVAL OF LOOSE BODY (Left )  Patient Location: PACU  Anesthesia Type:General  Level of Consciousness: awake and patient cooperative  Airway & Oxygen Therapy: Patient Spontanous Breathing  Post-op Assessment: Report given to RN and Post -op Vital signs reviewed and stable  Post vital signs: Reviewed and stable  Last Vitals:  Vitals Value Taken Time  BP    Temp    Pulse 82 05/15/19 1213  Resp    SpO2 96 % 05/15/19 1213  Vitals shown include unvalidated device data.  Last Pain:  Vitals:   05/15/19 1100  PainSc: 0-No pain         Complications: No apparent anesthesia complications

## 2019-05-15 NOTE — Op Note (Signed)
Preoperative diagnosis: Left knee posterior lateral meniscus tear and small loose body.  Postoperative diagnosis: Same  Procedure: Diagnostic and operative arthroscopy left knee partial lateral meniscectomy removal of loose body.  Surgeon: Rodell Perna, MD  Anesthesia: General oral tracheal plus Marcaine local.  Tourniquet: None  Procedure after standard prep and drape and general anesthesia timeout procedure with leg holder DuraPrep the usual impervious stockinette Covan arthroscopic sheets drapes pouch application.  Inflow was placed through the superior lateral portal medial lateral parapatellar tendon portals were used for scope and probe placement.  Knee was sequentially inspected there was some chondromalacia of the patellofemoral joint in the trochlear groove which is debrided with a shaver.  Medial meniscus had some fraying anteriorly but no subluxing tear.  There was small area of grade III chondromalacia on the medial femoral condyle corresponding medial tibial plateau.  ACL PCL was normal.  There was truncated bony loose body that had a synovial attachment that would floating out of the lateral joint line and was attached adjacent to the PCL.  Using small straight flat baskets the soft tissue attachment was cut and piece of bone was grasped with a pituitary and removed.  There is complex tear the posterior horn lateral meniscus with undersurface tear that was subluxed into the joint it was probed photographed.  Four-point 2 shaver was used to perform a partial posterior lateral meniscectomy as well as using a small straight flat baskets.  This trimmed back to a stable rim.  Anterior mid body of the lateral meniscus was intact.  Knee was thoroughly irrigated lavage to look for any remaining cartilage pieces.  Knee was suctioned dry tincture benzoin Steri-Strips Tegaderm 4 x 4's web roll Marcaine infiltration postop dressing with Ace wrap after 6 inch web roll and transferred to care room in  stable condition.

## 2019-05-15 NOTE — Telephone Encounter (Signed)
Order has been faxed

## 2019-05-15 NOTE — Anesthesia Procedure Notes (Signed)
Anesthesia Regional Block: Adductor canal block   Pre-Anesthetic Checklist: ,, timeout performed, Correct Patient, Correct Site, Correct Laterality, Correct Procedure, Correct Position, site marked, Risks and benefits discussed,  Surgical consent,  Pre-op evaluation,  At surgeon's request and post-op pain management  Laterality: Left and Lower  Prep: chloraprep       Needles:  Injection technique: Single-shot     Needle Length: 9cm  Needle Gauge: 22     Additional Needles: Arrow StimuQuik ECHO Echogenic Stimulating PNB Needle  Procedures:,,,, ultrasound used (permanent image in chart),,,,  Narrative:  Start time: 05/15/2019 10:48 AM End time: 05/15/2019 10:52 AM Injection made incrementally with aspirations every 5 mL.  Performed by: Personally  Anesthesiologist: Oleta Mouse, MD

## 2019-05-15 NOTE — Telephone Encounter (Signed)
Alixandra with [employer] called concerning Rx being sent to the pharmacy instead of patient bringing in a paper Rx.  CB# is [phone].  Please advise.  Thank You.

## 2019-05-15 NOTE — Telephone Encounter (Signed)
Patient's daughter Wells Guiles called left voicemail message asking for an order to be faxed over to [facility] for a walker for her mother. The fax# is [phone] The number to contact Wells Guiles is [phone]

## 2019-05-15 NOTE — H&P (Signed)
Patient: Karina Davis                                   Date of Birth: 06/30/1946                                                  MRN: [id number] Visit Date: 03/31/2019                                                                     Requested by: Harrison Mons, [place], [place] [id number] PCP: Harrison Mons, PA   Assessment & Plan: Visit Diagnoses:  1. Chondral loose body of left knee joint   2. Tear of lateral meniscus of left knee, current, unspecified tear type, subsequent encounter     Plan: Patient is having persistent mechanical symptoms with knee locking catching and associated effusion.  A portion of her pain may be coming from chondral wear but she does have loose piece loose body as well as posterior lateral meniscal tear involving the posterior lateral meniscus.  Plan will be outpatient arthroscopy with debridement lateral meniscal tear removal of loose body.  Procedure discussed she is concerned about falling and states she is not able to ambulate well the way her knee currently is.  Plain radiographs do not show significant enough knee arthritis to consider total knee arthroplasty at this time.  Procedure discussed questions elicited and answered she understands and requests we proceed.  Follow-Up Instructions: No follow-ups on file.   Orders:  No orders of the defined types were placed in this encounter.  No orders of the defined types were placed in this encounter.     Procedures: No procedures performed   Clinical Data: No additional findings.   Subjective:     Chief Complaint  Patient presents with  . Left Knee - Follow-up    MRI Left Knee Review    HPI 73 year old female returns with intermittent sharp pain in her knee with catches.  She points to the lateral joint line where she has pain.  Knee has been swelling particularly after her knee catches with significant increased pain.  She has been treated  with Aleve, ice, topical cream without relief.  Symptoms have been present for more than 3 months.  She did not get relief with intra-articular injection performed back in March.  Patient states she is having trouble walking in the community.  She is concerned about the sharp catching in her knee and then severe pain associated with swelling after each episode.  Review of Systems positive for overweight with BMI currently 33 previously greater than 40.  Positive hyperlipidemia, sleep apnea, lumbar spinal stenosis.  Hypertension type 2 diabetes on oral medication.  Last A1c was 6.7.   Objective: Vital Signs: Ht 5' 1.5" (1.562 m)   Wt 180 lb (81.6 kg)   BMI 33.46 kg/m   Physical Exam Constitutional:      Appearance: She is well-developed.  HENT:     Head: Normocephalic.  Right Ear: External ear normal.     Left Ear: External ear normal.  Eyes:     Pupils: Pupils are equal, round, and reactive to light.  Neck:     Thyroid: No thyromegaly.     Trachea: No tracheal deviation.  Cardiovascular:     Rate and Rhythm: Normal rate.  Pulmonary:     Effort: Pulmonary effort is normal.  Abdominal:     Palpations: Abdomen is soft.  Skin:    General: Skin is warm and dry.  Neurological:     Mental Status: She is alert and oriented to person, place, and time.  Psychiatric:        Behavior: Behavior normal.     Ortho Exam patient has pain with hyperextension but does reach full extension.  She has lateral and posterior lateral joint line tenderness.  With flexion-extension today she did not lock while in the clinic.  She is amatory with a limp.  Negative logroll to the hips negative straight leg raising 90 degrees knee and ankle jerk are intact distal pulses are normal negative Homan no calf tenderness no venous stasis changes.  Anterior tib gastrocsoleus is strong.  Pain with popliteal palpation at the posterior lateral joint line.  Specialty Comments:  No specialty comments  available.  Imaging: MRI scan left knee done on 03/15/19  Impression large radial tear of the posterior horn/root of the lateral meniscus.  2.  Grade 1 MCL sprain  3.  Large joint effusion chondral loose body posteriorly to the lateral femoral condyle.  4.  High-grade cartilage loss in the posterior aspect the lateral femoral and tibial condyles and the patella.  Marcial Pacas, MD PMFS History:     Patient Active Problem List   Diagnosis Date Noted  . Class 3 severe obesity due to excess calories with serious comorbidity and body mass index (BMI) of 40.0 to 44.9 in adult ([place]) 08/05/2018  . Body mass index (BMI) of 40.0 to 44.9 in adult ([place]) 08/05/2018  . Spinal stenosis of lumbar region 09/14/2017  . Osteopenia 09/03/2017  . OSA (obstructive sleep apnea) 05/03/2017  . Diabetic neuropathy ([place]) 03/30/2017  . Carious teeth 01/04/2014  . HTN (hypertension) 06/29/2013  . DM (diabetes mellitus), type 2 ([place]) 06/29/2013  . Hyperlipidemia with target LDL less than 70 06/29/2013  . BMI 37.0-37.9, adult        Past Medical History:  Diagnosis Date  . Ankle fracture   . Diabetes mellitus without complication ([place])   . Diabetic neuropathy ([place]) 03/30/2017  . GERD (gastroesophageal reflux disease)   . Hypertension   . Neuropathy   . Obesity   . Sleep apnea    cpap         Family History  Problem Relation Age of Onset  . Cancer Mother 98       Colon  . Colon cancer Mother 61  . Stroke Father 32  . Diabetes Father   . Heart disease Father   . Hypertension Father   . Hypertension Sister   . Diabetes Sister   . Kidney disease Sister        due to HTN and DM  . Hypertension Brother   . Stroke Brother   . Hypertension Daughter   . Diabetes Daughter   . Obesity Daughter        gastric sleeve surgery 03/05/2014  . Hypertension Sister   . Drug abuse Sister        narcotic abuse  . Stroke  Sister        associated with narcotic abuse  .  Diabetes Sister   . Hypertension Sister   . Hyperlipidemia Sister   . Hypertension Brother   . Drug abuse Son        cocaine  . Stroke Brother   . Hypertension Brother   . Esophageal cancer Neg Hx   . Rectal cancer Neg Hx   . Stomach cancer Neg Hx          Past Surgical History:  Procedure Laterality Date  . APPENDECTOMY  1955   childhood  . COLON SURGERY     Social History        Occupational History  . Occupation: Realtor  Tobacco Use  . Smoking status: Never Smoker  . Smokeless tobacco: Never Used  Substance and Sexual Activity  . Alcohol use: Yes    Comment: Socially  . Drug use: No  . Sexual activity: Not Currently

## 2019-05-15 NOTE — Interval H&P Note (Signed)
History and Physical Interval Note:  05/15/2019 10:29 AM  Karina Davis  has presented today for surgery, with the diagnosis of LEFT KNEE LOOSE BODY, LATERAL MENISCAL TEAR.  The various methods of treatment have been discussed with the patient and family. After consideration of risks, benefits and other options for treatment, the patient has consented to  Procedure(s): LEFT KNEE ARTHOSCOPY PARTIAL MENISCECTOMY, [place] (Left) as a surgical intervention.  The patient's history has been reviewed, patient examined, no change in status, stable for surgery.  I have reviewed the patient's chart and labs.  Questions were answered to the patient's satisfaction.     Marybelle Killings

## 2019-05-15 NOTE — Discharge Instructions (Addendum)
Remove dressing 48 hours postop, shower and leave clear dressing on, after shower dry off and reapply one of the ace wraps to help the swelling. .  Begin range of motion exercises, but nothing aggressive.  Weight bear as tolerated and wean off crutches as comfort allows.  Elevate foot above heart level as much as possible.    No driving until further notice.   If you have fever/chills or increased pain you should contact our office immediately.

## 2019-05-15 NOTE — Anesthesia Procedure Notes (Signed)
Procedure Name: Intubation Date/Time: 05/15/2019 11:24 AM Performed by: Imagene Riches, CRNA Pre-anesthesia Checklist: Patient identified, Emergency Drugs available, Suction available and Patient being monitored Patient Re-evaluated:Patient Re-evaluated prior to induction Oxygen Delivery Method: Circle System Utilized Preoxygenation: Pre-oxygenation with 100% oxygen Induction Type: IV induction Ventilation: Mask ventilation without difficulty Laryngoscope Size: Miller and 3 Grade View: Grade II Tube type: Oral Tube size: 7.0 mm Number of attempts: 1 Airway Equipment and Method: Stylet and Oral airway Placement Confirmation: ETT inserted through vocal cords under direct vision,  positive ETCO2 and breath sounds checked- equal and bilateral Secured at: 22 cm Tube secured with: Tape Dental Injury: Teeth and Oropharynx as per pre-operative assessment  Comments: DL x1 by Imagene Riches, grade 2b view, esophageal intubation. DL x2 Dr. Ermalene Postin, grade 2 view, ETT passed through cords.

## 2019-05-15 NOTE — Telephone Encounter (Signed)
Taken care of

## 2019-05-15 NOTE — Telephone Encounter (Signed)
rx faxed

## 2019-05-16 ENCOUNTER — Encounter (HOSPITAL_COMMUNITY): Payer: Self-pay | Admitting: Orthopaedic Surgery

## 2019-05-17 ENCOUNTER — Encounter (HOSPITAL_COMMUNITY): Payer: Self-pay | Admitting: Orthopaedic Surgery

## 2019-05-17 NOTE — Anesthesia Postprocedure Evaluation (Signed)
Anesthesia Post Note  Patient: Karina Davis  Procedure(s) Performed: LEFT KNEE ARTHOSCOPY PARTIAL MENISCECTOMY, REMOVAL OF LOOSE BODY (Left )     Patient location during evaluation: PACU Anesthesia Type: Regional and General Level of consciousness: awake and alert Pain management: pain level controlled Vital Signs Assessment: post-procedure vital signs reviewed and stable Respiratory status: spontaneous breathing, nonlabored ventilation, respiratory function stable and patient connected to nasal cannula oxygen Cardiovascular status: blood pressure returned to baseline and stable Postop Assessment: no apparent nausea or vomiting Anesthetic complications: no    Last Vitals:  Vitals:   05/15/19 1246 05/15/19 1254  BP:  (!) 175/71  Pulse: 71 72  Resp:  12  Temp:  (!) 36.3 C  SpO2: 97% 96%    Last Pain:  Vitals:   05/15/19 1254  PainSc: 2                  Muaad Boehning

## 2019-05-18 ENCOUNTER — Telehealth: Payer: Self-pay | Admitting: Orthopaedic Surgery

## 2019-05-18 NOTE — Telephone Encounter (Signed)
I called.

## 2019-05-18 NOTE — Telephone Encounter (Signed)
Patient called asked should she be putting weight on her leg. Patient also asked if she should leave the plastic on her leg until she is seen 05/23/2019? The number to contact patient is [phone]

## 2019-05-18 NOTE — Telephone Encounter (Signed)
Please advise 

## 2019-05-19 NOTE — Telephone Encounter (Signed)
noted 

## 2019-05-23 ENCOUNTER — Encounter: Payer: Self-pay | Admitting: Orthopaedic Surgery

## 2019-05-23 ENCOUNTER — Ambulatory Visit (INDEPENDENT_AMBULATORY_CARE_PROVIDER_SITE_OTHER): Payer: PPO | Admitting: Orthopaedic Surgery

## 2019-05-23 ENCOUNTER — Other Ambulatory Visit: Payer: Self-pay

## 2019-05-23 DIAGNOSIS — M25562 Pain in left knee: Secondary | ICD-10-CM | POA: Insufficient documentation

## 2019-05-23 MED ORDER — MELOXICAM 15 MG PO TABS: 15.0000 mg | ORAL_TABLET | Freq: Every day | ORAL | 2 refills | Status: AC

## 2019-05-23 NOTE — Progress Notes (Signed)
Post-Op Visit Note   Patient: Karina Davis           Date of Birth: [birth year]           MRN: [id number] Visit Date: 05/23/2019 PCP: Harrison Mons, PA   Assessment & Plan:recheck one month. Continue walking.   Chief Complaint:  Chief Complaint  Patient presents with  . Left Knee - Routine Post Op    05/15/2019 Left knee arthroscopy, partial meniscectomy, removal of loose body   Visit Diagnoses:  1. Left knee pain, unspecified chronicity     Plan: Patient returns post partial medial meniscectomy she had some trochlear groove where the patellofemoral joint and loose body that was removed.  She is used a grocery cart and already gone shopping.  Swelling is down Steri-Strips applied and changed.  Hydrocodone made it hard for her to sleep she is just using meloxicam.  She is happy the surgical results and can gradually increase her activity.  I will recheck her for final visit in 1 month.  Opposite knee is giving her some problems and currently she still using her cane since she has some neuropathy with numbness in her feet.  Follow-Up Instructions: Return in about 1 month (around 06/23/2019).   Orders:  No orders of the defined types were placed in this encounter.  No orders of the defined types were placed in this encounter.   Imaging: No results found.  PMFS History: Patient Active Problem List   Diagnosis Date Noted  . Pain in left knee 05/23/2019  . Lateral meniscal tear 05/15/2019  . Class 3 severe obesity due to excess calories with serious comorbidity and body mass index (BMI) of 40.0 to 44.9 in adult ([place]) 08/05/2018  . Body mass index (BMI) of 40.0 to 44.9 in adult ([place]) 08/05/2018  . Spinal stenosis of lumbar region 09/14/2017  . Osteopenia 09/03/2017  . OSA (obstructive sleep apnea) 05/03/2017  . Diabetic neuropathy ([place]) 03/30/2017  . Carious teeth 01/04/2014  . HTN (hypertension) 06/29/2013  . DM (diabetes mellitus), type 2 ([place]) 06/29/2013  .  Hyperlipidemia with target LDL less than 70 06/29/2013  . BMI 37.0-37.9, adult    Past Medical History:  Diagnosis Date  . Ankle fracture   . Diabetes mellitus without complication ([place])   . Diabetic neuropathy ([place]) 03/30/2017  . GERD (gastroesophageal reflux disease)   . Hypertension   . Neuropathy   . Obesity   . Sleep apnea    cpap    Family History  Problem Relation Age of Onset  . Cancer Mother 3       Colon  . Colon cancer Mother 72  . Stroke Father 8  . Diabetes Father   . Heart disease Father   . Hypertension Father   . Hypertension Sister   . Diabetes Sister   . Kidney disease Sister        due to HTN and DM  . Hypertension Brother   . Stroke Brother   . Hypertension Daughter   . Diabetes Daughter   . Obesity Daughter        gastric sleeve surgery 03/05/2014  . Hypertension Sister   . Drug abuse Sister        narcotic abuse  . Stroke Sister        associated with narcotic abuse  . Diabetes Sister   . Hypertension Sister   . Hyperlipidemia Sister   . Hypertension Brother   . Drug abuse Son  cocaine  . Stroke Brother   . Hypertension Brother   . Esophageal cancer Neg Hx   . Rectal cancer Neg Hx   . Stomach cancer Neg Hx     Past Surgical History:  Procedure Laterality Date  . APPENDECTOMY  1955   childhood  . COLON SURGERY    . KNEE ARTHROSCOPY WITH LATERAL MENISECTOMY Left 05/15/2019   Procedure: LEFT KNEE ARTHOSCOPY PARTIAL MENISCECTOMY, REMOVAL OF LOOSE BODY;  Surgeon: Marybelle Killings, MD;  Location: [facility];  Service: Orthopedics;  Laterality: Left;   Social History   Occupational History  . Occupation: Realtor  Tobacco Use  . Smoking status: Never Smoker  . Smokeless tobacco: Never Used  Substance and Sexual Activity  . Alcohol use: Yes    Comment: Socially  . Drug use: No  . Sexual activity: Not Currently

## 2019-05-23 NOTE — Addendum Note (Signed)
Addended by: Meyer Cory on: 05/23/2019 03:59 PM   Modules accepted: Orders

## 2019-06-20 ENCOUNTER — Ambulatory Visit (INDEPENDENT_AMBULATORY_CARE_PROVIDER_SITE_OTHER): Payer: PPO | Admitting: Orthopaedic Surgery

## 2019-06-20 ENCOUNTER — Encounter: Payer: Self-pay | Admitting: Orthopaedic Surgery

## 2019-06-20 VITALS — BP 171/86 | HR 77 | Ht 61.5 in | Wt 218.0 lb

## 2019-06-20 DIAGNOSIS — S83282S Other tear of lateral meniscus, current injury, left knee, sequela: Secondary | ICD-10-CM

## 2019-06-20 NOTE — Progress Notes (Signed)
Post-Op Visit Note   Patient: Karina Davis           Date of Birth: 12/27/1945           MRN: [id number] Visit Date: 06/20/2019 PCP: Harrison Mons, PA   Assessment & Plan: Postop left knee chondral debridement partial meniscectomy removal loose body, left knee 05/15/2019.  Chief Complaint:  Chief Complaint  Patient presents with  . Left Knee - Follow-up    05/15/2019 Left Knee Arthroscopy with Partial Meniscectomy, Removal of Loose Body   Visit Diagnoses:  1. Tear of lateral meniscus of left knee, unspecified tear type, unspecified whether old or current tear, sequela     Plan: She is walking better she has had some problems in her posterior thigh sometimes numbness down into her foot and ankle she does have peripheral neuropathy with her diabetes but also had some lateral recess stenosis on the left at L to 3 and also L3-4 on previous MRI done within the last year.  She is using Tylenol arthritis.  Mobic bothers her stomach.  She occasionally uses either Aleve or Advil for short 1 or 2 dose interval and then stops before it bothers her stomach.  She is walking better not using a cane.  She will return if she has increased problems.  We reviewed her previous x-rays that showed fairly significant right knee osteoarthritis and only mild radiographic changes on her left knee.  Follow-Up Instructions: Return if symptoms worsen or fail to improve.   Orders:  No orders of the defined types were placed in this encounter.  No orders of the defined types were placed in this encounter.   Imaging: No results found.  PMFS History: Patient Active Problem List   Diagnosis Date Noted  . Pain in left knee 05/23/2019  . Lateral meniscal tear 05/15/2019  . Class 3 severe obesity due to excess calories with serious comorbidity and body mass index (BMI) of 40.0 to 44.9 in adult ([place]) 08/05/2018  . Body mass index (BMI) of 40.0 to 44.9 in adult ([place]) 08/05/2018  . Spinal stenosis of lumbar  region 09/14/2017  . Osteopenia 09/03/2017  . OSA (obstructive sleep apnea) 05/03/2017  . Diabetic neuropathy ([place]) 03/30/2017  . Carious teeth 01/04/2014  . HTN (hypertension) 06/29/2013  . DM (diabetes mellitus), type 2 ([place]) 06/29/2013  . Hyperlipidemia with target LDL less than 70 06/29/2013  . BMI 37.0-37.9, adult    Past Medical History:  Diagnosis Date  . Ankle fracture   . Diabetes mellitus without complication ([place])   . Diabetic neuropathy ([place]) 03/30/2017  . GERD (gastroesophageal reflux disease)   . Hypertension   . Neuropathy   . Obesity   . Sleep apnea    cpap    Family History  Problem Relation Age of Onset  . Cancer Mother 45       Colon  . Colon cancer Mother 52  . Stroke Father 41  . Diabetes Father   . Heart disease Father   . Hypertension Father   . Hypertension Sister   . Diabetes Sister   . Kidney disease Sister        due to HTN and DM  . Hypertension Brother   . Stroke Brother   . Hypertension Daughter   . Diabetes Daughter   . Obesity Daughter        gastric sleeve surgery 03/05/2014  . Hypertension Sister   . Drug abuse Sister        narcotic  abuse  . Stroke Sister        associated with narcotic abuse  . Diabetes Sister   . Hypertension Sister   . Hyperlipidemia Sister   . Hypertension Brother   . Drug abuse Son        cocaine  . Stroke Brother   . Hypertension Brother   . Esophageal cancer Neg Hx   . Rectal cancer Neg Hx   . Stomach cancer Neg Hx     Past Surgical History:  Procedure Laterality Date  . APPENDECTOMY  1955   childhood  . COLON SURGERY    . KNEE ARTHROSCOPY WITH LATERAL MENISECTOMY Left 05/15/2019   Procedure: LEFT KNEE ARTHOSCOPY PARTIAL MENISCECTOMY, REMOVAL OF LOOSE BODY;  Surgeon: Marybelle Killings, MD;  Location: [facility];  Service: Orthopedics;  Laterality: Left;   Social History   Occupational History  . Occupation: Realtor  Tobacco Use  . Smoking status: Never Smoker  . Smokeless tobacco: Never Used   Substance and Sexual Activity  . Alcohol use: Yes    Comment: Socially  . Drug use: No  . Sexual activity: Not Currently

## 2019-09-28 DIAGNOSIS — E1169 Type 2 diabetes mellitus with other specified complication: Secondary | ICD-10-CM | POA: Diagnosis not present

## 2019-09-28 DIAGNOSIS — R2242 Localized swelling, mass and lump, left lower limb: Secondary | ICD-10-CM | POA: Diagnosis not present

## 2019-09-28 DIAGNOSIS — M79671 Pain in right foot: Secondary | ICD-10-CM | POA: Diagnosis not present

## 2019-09-28 DIAGNOSIS — E785 Hyperlipidemia, unspecified: Secondary | ICD-10-CM | POA: Diagnosis not present

## 2019-09-28 DIAGNOSIS — Z23 Encounter for immunization: Secondary | ICD-10-CM | POA: Diagnosis not present

## 2019-09-28 DIAGNOSIS — I10 Essential (primary) hypertension: Secondary | ICD-10-CM | POA: Diagnosis not present

## 2019-09-28 DIAGNOSIS — Z1231 Encounter for screening mammogram for malignant neoplasm of breast: Secondary | ICD-10-CM | POA: Diagnosis not present

## 2019-09-28 DIAGNOSIS — E1140 Type 2 diabetes mellitus with diabetic neuropathy, unspecified: Secondary | ICD-10-CM | POA: Diagnosis not present

## 2019-09-28 DIAGNOSIS — E1159 Type 2 diabetes mellitus with other circulatory complications: Secondary | ICD-10-CM | POA: Diagnosis not present

## 2019-10-17 DIAGNOSIS — H52223 Regular astigmatism, bilateral: Secondary | ICD-10-CM | POA: Diagnosis not present

## 2019-10-17 DIAGNOSIS — E119 Type 2 diabetes mellitus without complications: Secondary | ICD-10-CM | POA: Diagnosis not present

## 2019-10-17 DIAGNOSIS — H524 Presbyopia: Secondary | ICD-10-CM | POA: Diagnosis not present

## 2019-10-17 DIAGNOSIS — H35033 Hypertensive retinopathy, bilateral: Secondary | ICD-10-CM | POA: Diagnosis not present

## 2019-10-17 DIAGNOSIS — H5213 Myopia, bilateral: Secondary | ICD-10-CM | POA: Diagnosis not present

## 2019-10-17 DIAGNOSIS — H2513 Age-related nuclear cataract, bilateral: Secondary | ICD-10-CM | POA: Diagnosis not present

## 2019-10-17 LAB — HM DIABETES EYE EXAM

## 2019-10-24 ENCOUNTER — Encounter: Payer: Self-pay | Admitting: Physician Assistant

## 2019-10-24 ENCOUNTER — Encounter: Payer: Self-pay | Admitting: Family Medicine

## 2019-12-11 DIAGNOSIS — Z1231 Encounter for screening mammogram for malignant neoplasm of breast: Secondary | ICD-10-CM | POA: Diagnosis not present

## 2020-01-04 DIAGNOSIS — H35033 Hypertensive retinopathy, bilateral: Secondary | ICD-10-CM | POA: Insufficient documentation

## 2020-01-04 DIAGNOSIS — H2513 Age-related nuclear cataract, bilateral: Secondary | ICD-10-CM | POA: Insufficient documentation

## 2020-01-04 DIAGNOSIS — E113292 Type 2 diabetes mellitus with mild nonproliferative diabetic retinopathy without macular edema, left eye: Secondary | ICD-10-CM | POA: Insufficient documentation

## 2020-02-15 DIAGNOSIS — I10 Essential (primary) hypertension: Secondary | ICD-10-CM | POA: Diagnosis not present

## 2020-02-15 DIAGNOSIS — E113291 Type 2 diabetes mellitus with mild nonproliferative diabetic retinopathy without macular edema, right eye: Secondary | ICD-10-CM | POA: Diagnosis not present

## 2020-02-15 DIAGNOSIS — E1140 Type 2 diabetes mellitus with diabetic neuropathy, unspecified: Secondary | ICD-10-CM | POA: Diagnosis not present

## 2020-02-15 DIAGNOSIS — E1169 Type 2 diabetes mellitus with other specified complication: Secondary | ICD-10-CM | POA: Diagnosis not present

## 2020-02-15 DIAGNOSIS — E785 Hyperlipidemia, unspecified: Secondary | ICD-10-CM | POA: Diagnosis not present

## 2020-02-15 DIAGNOSIS — M48062 Spinal stenosis, lumbar region with neurogenic claudication: Secondary | ICD-10-CM | POA: Diagnosis not present

## 2020-02-15 DIAGNOSIS — E1159 Type 2 diabetes mellitus with other circulatory complications: Secondary | ICD-10-CM | POA: Diagnosis not present

## 2020-02-15 DIAGNOSIS — Z0000 Encounter for general adult medical examination without abnormal findings: Secondary | ICD-10-CM | POA: Diagnosis not present

## 2020-02-15 DIAGNOSIS — Z6841 Body Mass Index (BMI) 40.0 and over, adult: Secondary | ICD-10-CM | POA: Diagnosis not present

## 2020-02-22 DIAGNOSIS — Z13228 Encounter for screening for other metabolic disorders: Secondary | ICD-10-CM | POA: Diagnosis not present

## 2020-02-22 DIAGNOSIS — M48062 Spinal stenosis, lumbar region with neurogenic claudication: Secondary | ICD-10-CM | POA: Diagnosis not present

## 2020-02-22 DIAGNOSIS — E1140 Type 2 diabetes mellitus with diabetic neuropathy, unspecified: Secondary | ICD-10-CM | POA: Diagnosis not present

## 2020-02-22 DIAGNOSIS — G4733 Obstructive sleep apnea (adult) (pediatric): Secondary | ICD-10-CM | POA: Diagnosis not present

## 2020-02-22 DIAGNOSIS — Z6841 Body Mass Index (BMI) 40.0 and over, adult: Secondary | ICD-10-CM | POA: Diagnosis not present

## 2020-02-22 DIAGNOSIS — E1142 Type 2 diabetes mellitus with diabetic polyneuropathy: Secondary | ICD-10-CM | POA: Diagnosis not present

## 2020-02-22 DIAGNOSIS — E1159 Type 2 diabetes mellitus with other circulatory complications: Secondary | ICD-10-CM | POA: Diagnosis not present

## 2020-02-22 DIAGNOSIS — E039 Hypothyroidism, unspecified: Secondary | ICD-10-CM | POA: Diagnosis not present

## 2020-02-22 DIAGNOSIS — E1169 Type 2 diabetes mellitus with other specified complication: Secondary | ICD-10-CM | POA: Diagnosis not present

## 2020-02-22 DIAGNOSIS — H35033 Hypertensive retinopathy, bilateral: Secondary | ICD-10-CM | POA: Diagnosis not present

## 2020-02-22 DIAGNOSIS — K219 Gastro-esophageal reflux disease without esophagitis: Secondary | ICD-10-CM | POA: Diagnosis not present

## 2020-02-29 DIAGNOSIS — Z713 Dietary counseling and surveillance: Secondary | ICD-10-CM | POA: Diagnosis not present

## 2020-02-29 DIAGNOSIS — E1159 Type 2 diabetes mellitus with other circulatory complications: Secondary | ICD-10-CM | POA: Diagnosis not present

## 2020-02-29 DIAGNOSIS — Z6841 Body Mass Index (BMI) 40.0 and over, adult: Secondary | ICD-10-CM | POA: Diagnosis not present

## 2020-02-29 DIAGNOSIS — Z712 Person consulting for explanation of examination or test findings: Secondary | ICD-10-CM | POA: Diagnosis not present

## 2020-02-29 DIAGNOSIS — I152 Hypertension secondary to endocrine disorders: Secondary | ICD-10-CM | POA: Diagnosis not present

## 2020-03-11 DIAGNOSIS — Z6841 Body Mass Index (BMI) 40.0 and over, adult: Secondary | ICD-10-CM | POA: Diagnosis not present

## 2020-03-11 DIAGNOSIS — K648 Other hemorrhoids: Secondary | ICD-10-CM | POA: Diagnosis not present

## 2020-03-11 DIAGNOSIS — Z713 Dietary counseling and surveillance: Secondary | ICD-10-CM | POA: Diagnosis not present

## 2020-03-31 DIAGNOSIS — M25512 Pain in left shoulder: Secondary | ICD-10-CM | POA: Diagnosis not present

## 2020-03-31 DIAGNOSIS — S161XXA Strain of muscle, fascia and tendon at neck level, initial encounter: Secondary | ICD-10-CM | POA: Diagnosis not present

## 2020-03-31 DIAGNOSIS — M50322 Other cervical disc degeneration at C5-C6 level: Secondary | ICD-10-CM | POA: Diagnosis not present

## 2020-03-31 DIAGNOSIS — M19012 Primary osteoarthritis, left shoulder: Secondary | ICD-10-CM | POA: Diagnosis not present

## 2020-03-31 DIAGNOSIS — M25571 Pain in right ankle and joints of right foot: Secondary | ICD-10-CM | POA: Diagnosis not present

## 2020-03-31 DIAGNOSIS — M7989 Other specified soft tissue disorders: Secondary | ICD-10-CM | POA: Diagnosis not present

## 2020-03-31 DIAGNOSIS — M25511 Pain in right shoulder: Secondary | ICD-10-CM | POA: Diagnosis not present

## 2020-03-31 DIAGNOSIS — S9001XA Contusion of right ankle, initial encounter: Secondary | ICD-10-CM | POA: Diagnosis not present

## 2020-03-31 DIAGNOSIS — M542 Cervicalgia: Secondary | ICD-10-CM | POA: Diagnosis not present

## 2020-03-31 DIAGNOSIS — Y92410 Unspecified street and highway as the place of occurrence of the external cause: Secondary | ICD-10-CM | POA: Diagnosis not present

## 2020-03-31 DIAGNOSIS — R519 Headache, unspecified: Secondary | ICD-10-CM | POA: Diagnosis not present

## 2020-04-02 IMAGING — MR MR LUMBAR SPINE W/O CM
5 series · 44 of 48 positions shown · non-contrast
Comparison: 04/11/2017

CLINICAL DATA: Chronic low back and right flank pain for the past
year. Tingling in the hands and feet. Pain and numbness in both
feet.

EXAM:
MRI LUMBAR SPINE WITHOUT CONTRAST
TECHNIQUE: Multiplanar, multisequence MR imaging of the lumbar spine was
performed. No intravenous contrast was administered.

[Series 3: tirm sag · sagittal · 4.0mm · 0.55mm/px · 5 of 13 slices shown]
[im 1/13]
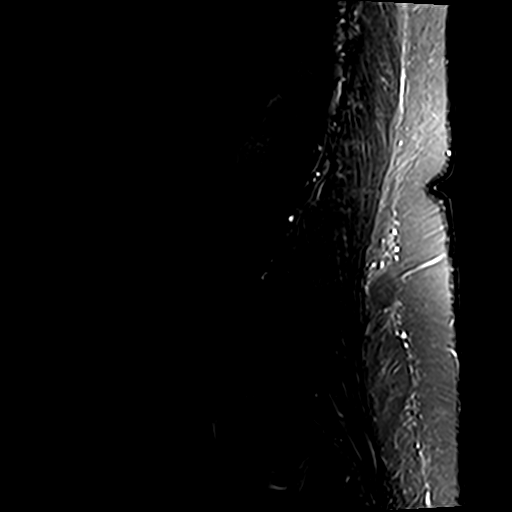
[im 4/13]
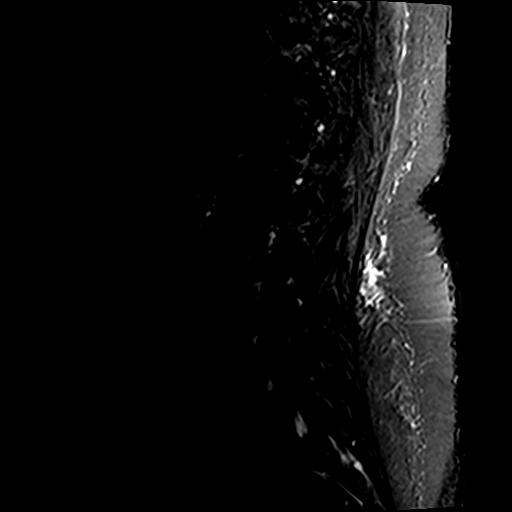
[im 7/13]
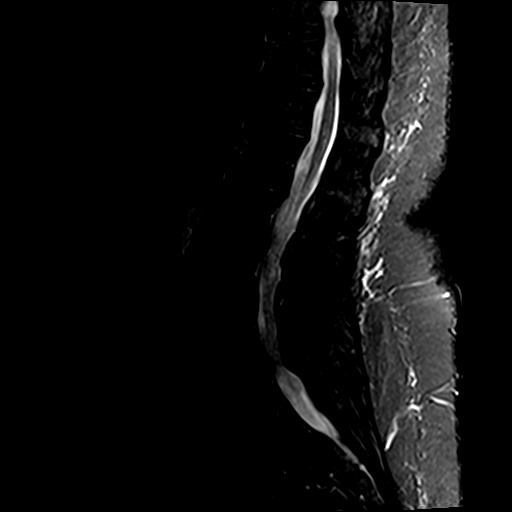
[im 10/13]
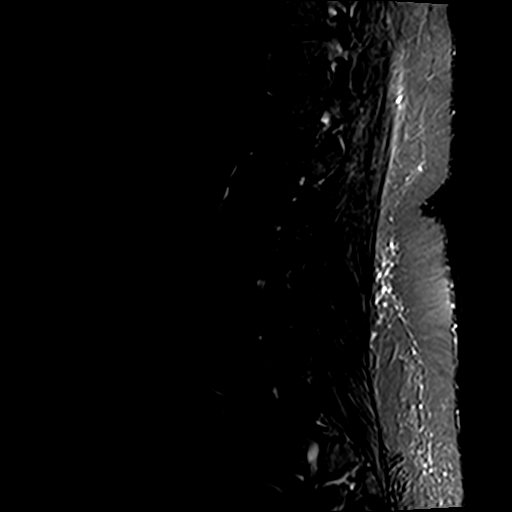
[im 13/13]
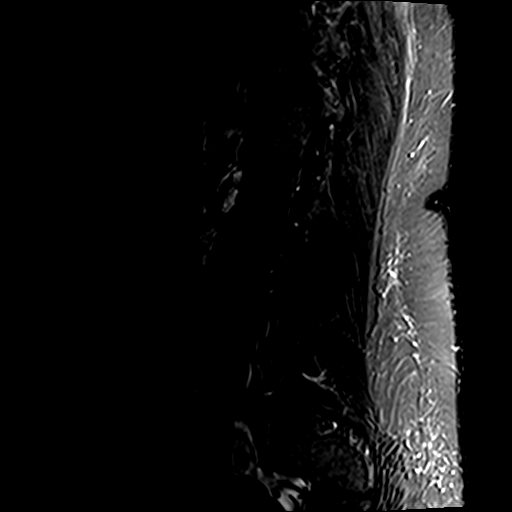

[Series 4: T1 · sagittal · 4.0mm · 0.88mm/px · 5 of 13 slices shown (1 of 2)]
[im 1/13]
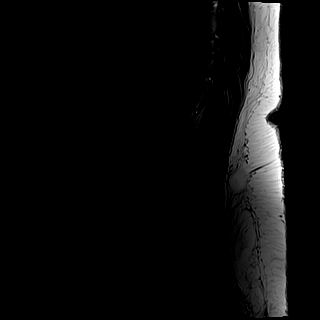
[im 4/13]
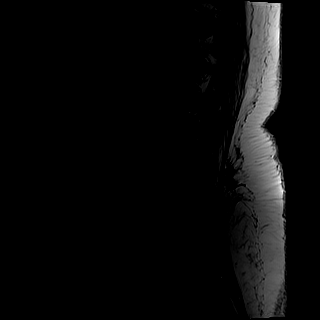
[im 7/13]
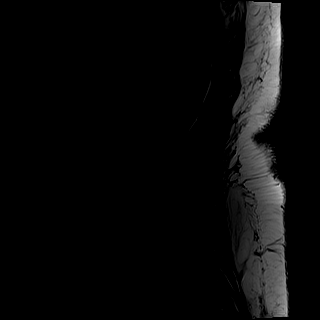
[im 10/13]
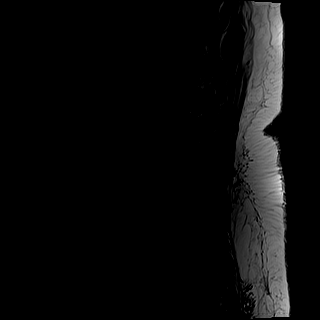
[im 13/13]
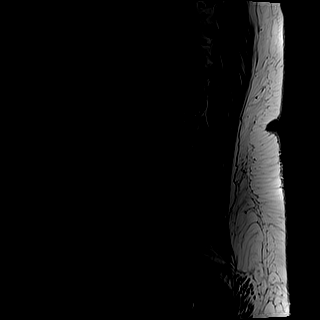

[Series 5: T2 · sagittal · 4.0mm · 0.88mm/px · 6 of 13 slices shown (1 of 2)]
[im 1/13]
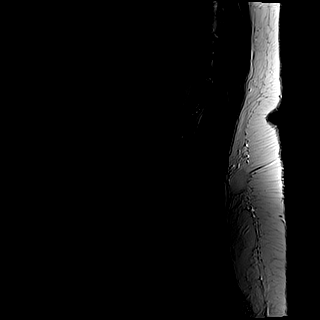
[im 3/13]
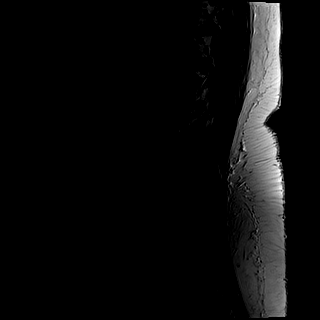
[im 5/13]
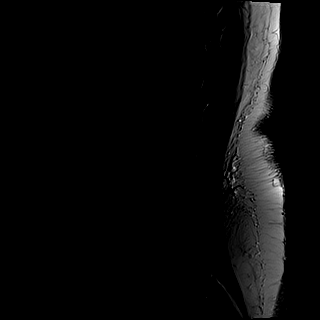
[im 8/13]
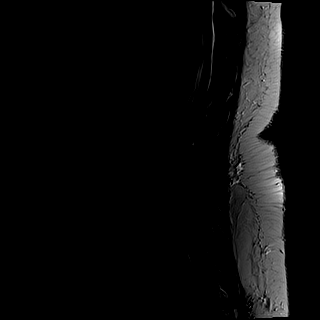
[im 10/13]
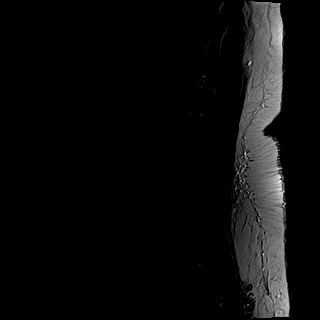
[im 13/13]
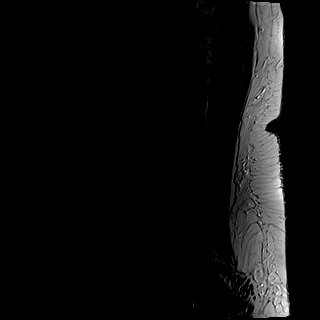

[Series 6: T1 · axial · 4.0mm · 0.78mm/px · z∈[-81,+119]mm · 12 of 36 slices shown (2 of 2)]
[im 1/36]
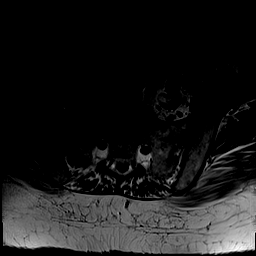
[im 3/36]
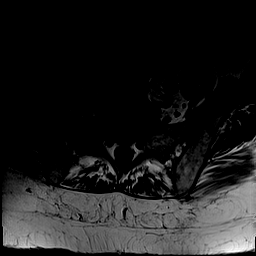
[im 5/36]
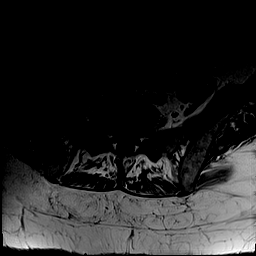
[im 8/36]
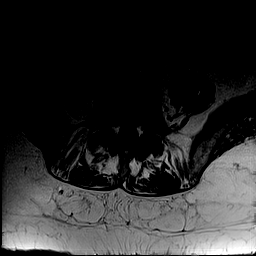
[im 10/36]
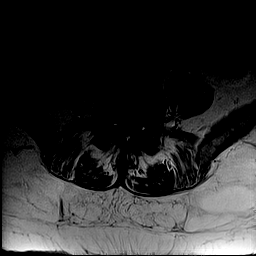
[im 12/36]
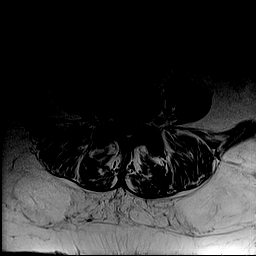
[im 17/36]
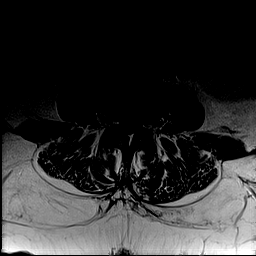
[im 19/36]
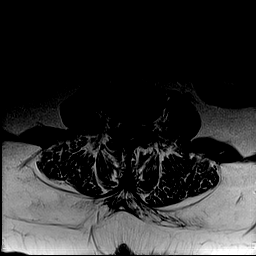
[im 22/36]
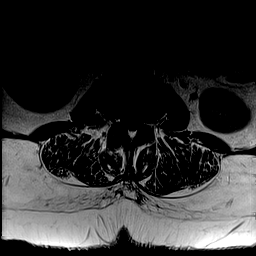
[im 26/36]
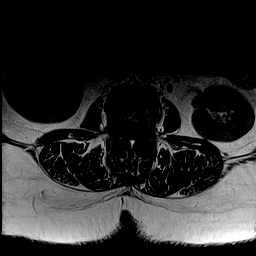
[im 31/36]
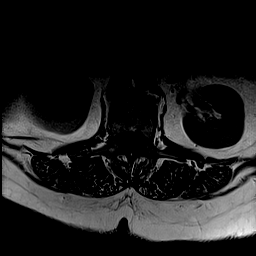
[im 36/36]
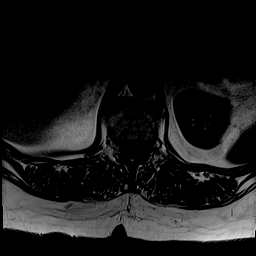

[Series 7: T2 · axial · 4.0mm · 0.78mm/px · z∈[-81,+119]mm · 16 of 36 slices shown (2 of 2)]
[im 1/36]
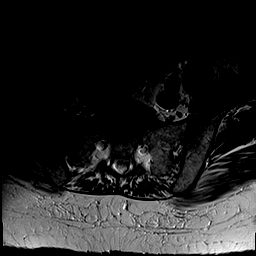
[im 3/36]
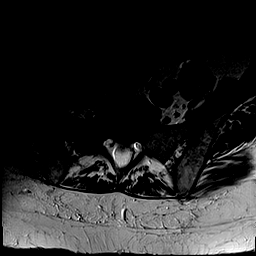
[im 5/36]
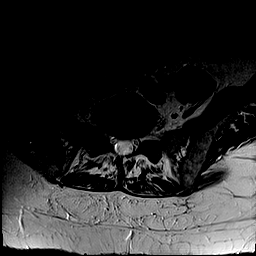
[im 8/36]
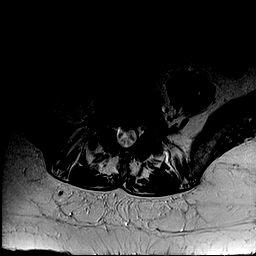
[im 10/36]
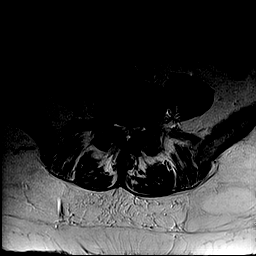
[im 12/36]
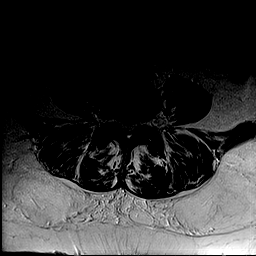
[im 15/36]
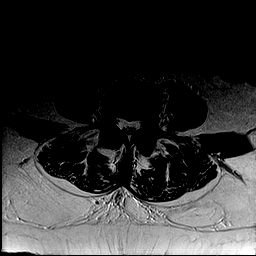
[im 17/36]
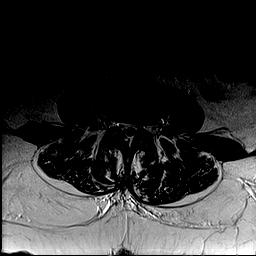
[im 19/36]
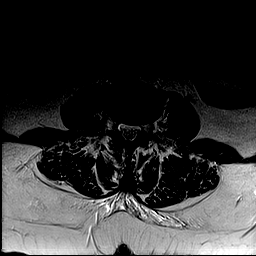
[im 22/36]
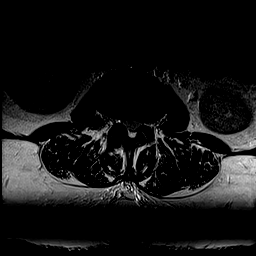
[im 24/36]
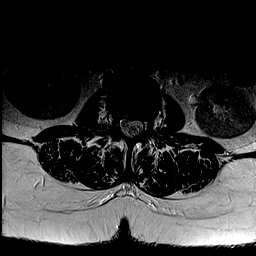
[im 26/36]
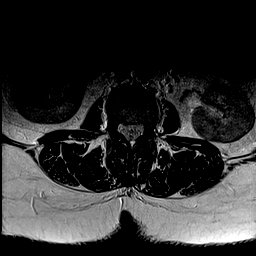
[im 29/36]
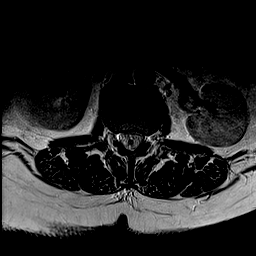
[im 31/36]
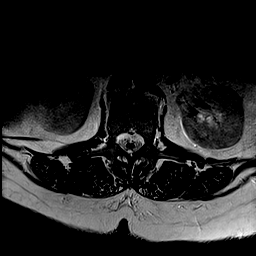
[im 33/36]
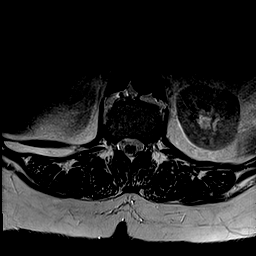
[im 36/36]
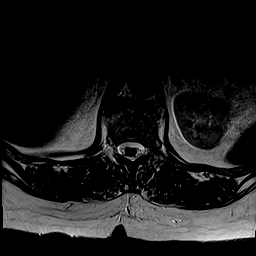

[44 of 48 positions shown; findings below may reference images not displayed]

FINDINGS: Segmentation:  Standard.

Alignment:  Unchanged trace anterolisthesis of L4 on L5.

Vertebrae: Increased marrow edema extending along the L3 superior
endplate to the right of midline which appears to be associated with
a very small Schmorl's node. No fracture or suspicious osseous
lesion. Mild degenerative endplate changes in the upper lumbar and
lower thoracic spine.

Conus medullaris and cauda equina: Conus extends to the L1-2 level.
Conus and cauda equina appear normal.

Paraspinal and other soft tissues: Unremarkable.

Disc levels:

Disc desiccation throughout the lumbar and lower thoracic spine with
exception of L5-S1. Mild disc space narrowing at L1-2 and in the
lower thoracic spine, unchanged.

T10-11: Only imaged sagittally. Moderate facet arthrosis resulting
in at most mild bilateral neural foraminal stenosis, similar to
prior. No spinal stenosis.

T11-12: Only imaged sagittally. Mild facet arthrosis without
stenosis, similar to prior.

T12-L1: Minimal disc bulging without stenosis, unchanged.

L1-2: Minimal disc bulging and mild facet hypertrophy without
stenosis, unchanged.

L2-3: Circumferential disc bulging, a broad left foraminal disc
protrusion, and mild facet hypertrophy result in mild right and
mild-to-moderate left lateral recess stenosis and mild spinal
stenosis, progressed from prior. No significant neural foraminal
stenosis.

L3-4: Mild facet and ligamentum flavum hypertrophy without
significant stenosis, unchanged. Unchanged 4 mm cyst within the
right ligamentum flavum.

L4-5: Anterolisthesis with slight disc uncovering, ligamentum flavum
thickening, and severe facet arthrosis result in moderate spinal
stenosis and moderate right greater than left lateral recess
stenosis, unchanged. No neural foraminal stenosis.

L5-S1: Mild facet hypertrophy without disc herniation or stenosis.
IMPRESSION: 1. Mildly progressive disc degeneration at L2-3 resulting in mild
spinal stenosis and left greater than right lateral recess stenosis.
2. Unchanged moderate multifactorial spinal stenosis at L4-5.

## 2020-04-16 ENCOUNTER — Ambulatory Visit: Payer: PPO | Admitting: Orthopaedic Surgery

## 2020-05-16 DIAGNOSIS — E1169 Type 2 diabetes mellitus with other specified complication: Secondary | ICD-10-CM | POA: Diagnosis not present

## 2020-05-16 DIAGNOSIS — M48062 Spinal stenosis, lumbar region with neurogenic claudication: Secondary | ICD-10-CM | POA: Diagnosis not present

## 2020-05-16 DIAGNOSIS — E785 Hyperlipidemia, unspecified: Secondary | ICD-10-CM | POA: Diagnosis not present

## 2020-05-16 DIAGNOSIS — E1140 Type 2 diabetes mellitus with diabetic neuropathy, unspecified: Secondary | ICD-10-CM | POA: Diagnosis not present

## 2020-05-16 DIAGNOSIS — E113291 Type 2 diabetes mellitus with mild nonproliferative diabetic retinopathy without macular edema, right eye: Secondary | ICD-10-CM | POA: Diagnosis not present

## 2020-05-16 DIAGNOSIS — R82998 Other abnormal findings in urine: Secondary | ICD-10-CM | POA: Diagnosis not present

## 2020-05-16 DIAGNOSIS — E1159 Type 2 diabetes mellitus with other circulatory complications: Secondary | ICD-10-CM | POA: Diagnosis not present

## 2020-05-16 DIAGNOSIS — Z6839 Body mass index (BMI) 39.0-39.9, adult: Secondary | ICD-10-CM | POA: Diagnosis not present

## 2020-05-16 DIAGNOSIS — R0989 Other specified symptoms and signs involving the circulatory and respiratory systems: Secondary | ICD-10-CM | POA: Diagnosis not present

## 2020-05-16 DIAGNOSIS — I152 Hypertension secondary to endocrine disorders: Secondary | ICD-10-CM | POA: Diagnosis not present

## 2020-05-16 DIAGNOSIS — E113292 Type 2 diabetes mellitus with mild nonproliferative diabetic retinopathy without macular edema, left eye: Secondary | ICD-10-CM | POA: Diagnosis not present

## 2020-05-16 DIAGNOSIS — Z5181 Encounter for therapeutic drug level monitoring: Secondary | ICD-10-CM | POA: Diagnosis not present

## 2020-05-16 DIAGNOSIS — M5416 Radiculopathy, lumbar region: Secondary | ICD-10-CM | POA: Diagnosis not present

## 2020-05-31 DIAGNOSIS — R0989 Other specified symptoms and signs involving the circulatory and respiratory systems: Secondary | ICD-10-CM | POA: Diagnosis not present

## 2020-06-05 DIAGNOSIS — Z20828 Contact with and (suspected) exposure to other viral communicable diseases: Secondary | ICD-10-CM | POA: Diagnosis not present

## 2020-06-05 DIAGNOSIS — R05 Cough: Secondary | ICD-10-CM | POA: Diagnosis not present

## 2020-06-06 DIAGNOSIS — Z20828 Contact with and (suspected) exposure to other viral communicable diseases: Secondary | ICD-10-CM | POA: Diagnosis not present

## 2020-06-06 DIAGNOSIS — R05 Cough: Secondary | ICD-10-CM | POA: Diagnosis not present

## 2020-08-16 DIAGNOSIS — Z8616 Personal history of COVID-19: Secondary | ICD-10-CM | POA: Insufficient documentation

## 2020-08-16 DIAGNOSIS — Z2820 Immunization not carried out because of patient decision for unspecified reason: Secondary | ICD-10-CM | POA: Diagnosis not present

## 2020-08-16 DIAGNOSIS — E1159 Type 2 diabetes mellitus with other circulatory complications: Secondary | ICD-10-CM | POA: Diagnosis not present

## 2020-08-16 DIAGNOSIS — E1169 Type 2 diabetes mellitus with other specified complication: Secondary | ICD-10-CM | POA: Diagnosis not present

## 2020-08-16 DIAGNOSIS — I152 Hypertension secondary to endocrine disorders: Secondary | ICD-10-CM | POA: Diagnosis not present

## 2020-08-16 DIAGNOSIS — E1140 Type 2 diabetes mellitus with diabetic neuropathy, unspecified: Secondary | ICD-10-CM | POA: Diagnosis not present

## 2020-08-16 DIAGNOSIS — E785 Hyperlipidemia, unspecified: Secondary | ICD-10-CM | POA: Diagnosis not present

## 2020-12-13 DIAGNOSIS — E785 Hyperlipidemia, unspecified: Secondary | ICD-10-CM | POA: Diagnosis not present

## 2020-12-13 DIAGNOSIS — I152 Hypertension secondary to endocrine disorders: Secondary | ICD-10-CM | POA: Diagnosis not present

## 2020-12-13 DIAGNOSIS — E1159 Type 2 diabetes mellitus with other circulatory complications: Secondary | ICD-10-CM | POA: Diagnosis not present

## 2020-12-13 DIAGNOSIS — Z6839 Body mass index (BMI) 39.0-39.9, adult: Secondary | ICD-10-CM | POA: Diagnosis not present

## 2020-12-13 DIAGNOSIS — E113292 Type 2 diabetes mellitus with mild nonproliferative diabetic retinopathy without macular edema, left eye: Secondary | ICD-10-CM | POA: Diagnosis not present

## 2020-12-13 DIAGNOSIS — E113291 Type 2 diabetes mellitus with mild nonproliferative diabetic retinopathy without macular edema, right eye: Secondary | ICD-10-CM | POA: Diagnosis not present

## 2020-12-13 DIAGNOSIS — E2839 Other primary ovarian failure: Secondary | ICD-10-CM | POA: Diagnosis not present

## 2020-12-13 DIAGNOSIS — Z2820 Immunization not carried out because of patient decision for unspecified reason: Secondary | ICD-10-CM | POA: Diagnosis not present

## 2020-12-13 DIAGNOSIS — Z1231 Encounter for screening mammogram for malignant neoplasm of breast: Secondary | ICD-10-CM | POA: Diagnosis not present

## 2020-12-13 DIAGNOSIS — M76821 Posterior tibial tendinitis, right leg: Secondary | ICD-10-CM | POA: Diagnosis not present

## 2020-12-13 DIAGNOSIS — E1169 Type 2 diabetes mellitus with other specified complication: Secondary | ICD-10-CM | POA: Diagnosis not present

## 2020-12-13 DIAGNOSIS — E1140 Type 2 diabetes mellitus with diabetic neuropathy, unspecified: Secondary | ICD-10-CM | POA: Diagnosis not present

## 2020-12-14 DIAGNOSIS — M76821 Posterior tibial tendinitis, right leg: Secondary | ICD-10-CM | POA: Insufficient documentation

## 2021-01-02 ENCOUNTER — Ambulatory Visit (INDEPENDENT_AMBULATORY_CARE_PROVIDER_SITE_OTHER): Payer: PPO

## 2021-01-02 ENCOUNTER — Ambulatory Visit (INDEPENDENT_AMBULATORY_CARE_PROVIDER_SITE_OTHER): Payer: PPO | Admitting: Podiatry

## 2021-01-02 ENCOUNTER — Other Ambulatory Visit: Payer: Self-pay

## 2021-01-02 ENCOUNTER — Encounter: Payer: Self-pay | Admitting: Podiatry

## 2021-01-02 DIAGNOSIS — M7751 Other enthesopathy of right foot: Secondary | ICD-10-CM

## 2021-01-02 DIAGNOSIS — E1141 Type 2 diabetes mellitus with diabetic mononeuropathy: Secondary | ICD-10-CM | POA: Diagnosis not present

## 2021-01-02 DIAGNOSIS — M67472 Ganglion, left ankle and foot: Secondary | ICD-10-CM

## 2021-01-02 DIAGNOSIS — M76821 Posterior tibial tendinitis, right leg: Secondary | ICD-10-CM

## 2021-01-02 DIAGNOSIS — M19079 Primary osteoarthritis, unspecified ankle and foot: Secondary | ICD-10-CM

## 2021-01-02 DIAGNOSIS — Q666 Other congenital valgus deformities of feet: Secondary | ICD-10-CM

## 2021-01-02 MED ORDER — TRIAMCINOLONE ACETONIDE 40 MG/ML IJ SUSP
20.0000 mg | Freq: Once | INTRAMUSCULAR | Status: AC
  Administered 2021-01-02: 20 mg

## 2021-01-03 ENCOUNTER — Other Ambulatory Visit: Payer: Self-pay | Admitting: Podiatry

## 2021-01-03 DIAGNOSIS — M67472 Ganglion, left ankle and foot: Secondary | ICD-10-CM

## 2021-01-06 NOTE — Progress Notes (Signed)
Subjective:  [name], female    DOB: [birth year],  MRN: [id number] HPI Chief Complaint  Patient presents with  . Foot Pain    Lateral foot and ankle right - injury last year, xrays and MRI done, still having pain, doc sent here for orthotics, foot turns outward when walking  . Foot Pain    Dorsal midfoot left - knot, concerned, not sore  . New Patient (Initial Visit)  . Diabetes    Last a1c was 6.?    75 y.o. female presents with the above complaint.   ROS: Denies fever chills nausea vomiting muscle aches pains calf pain back pain chest pain shortness of breath.  Past Medical History:  Diagnosis Date  . Ankle fracture   . Diabetes mellitus without complication ([place])   . Diabetic neuropathy ([place]) 03/30/2017  . GERD (gastroesophageal reflux disease)   . Hypertension   . Neuropathy   . Obesity   . Sleep apnea    cpap   Past Surgical History:  Procedure Laterality Date  . APPENDECTOMY  1955   childhood  . COLON SURGERY    . KNEE ARTHROSCOPY WITH LATERAL MENISECTOMY Left 05/15/2019   Procedure: LEFT KNEE ARTHOSCOPY PARTIAL MENISCECTOMY, REMOVAL OF LOOSE BODY;  Surgeon: Marybelle Killings, MD;  Location: [facility];  Service: Orthopedics;  Laterality: Left;    Current Outpatient Medications:  .  aspirin 81 MG tablet, Take 81 mg by mouth at bedtime. , Disp: , Rfl:  .  atorvastatin (LIPITOR) 40 MG tablet, Take 1 tablet (40 mg total) by mouth daily. (Patient taking differently: Take 20 mg by mouth every evening. ), Disp: 90 tablet, Rfl: 3 .  azelastine (ASTELIN) 0.1 % nasal spray, Place 2 sprays into both nostrils 2 (two) times daily. Use in each nostril as directed (Patient taking differently: Place 2 sprays into both nostrils 2 (two) times daily as needed for rhinitis. Use in each nostril as directed), Disp: 90 mL, Rfl: 3 .  b complex vitamins tablet, Take 1 tablet daily by mouth., Disp: , Rfl:  .  calcium carbonate (TUMS - DOSED IN MG ELEMENTAL CALCIUM) 500 MG chewable  tablet, Chew 1-2 tablets by mouth daily as needed for indigestion or heartburn., Disp: , Rfl:  .  CALCIUM CITRATE-VITAMIN D PO, Take 1 tablet by mouth 2 (two) times a day. , Disp: , Rfl:  .  chlorhexidine (PERIDEX) 0.12 % solution, USE AS DIRECTED, USE 15 MLS IN THE MOUTH OR THROAT 2 TIMES A DAY (Patient taking differently: Use as directed 15 mLs in the mouth or throat 2 (two) times daily as needed (oral soreness). ), Disp: 1892 mL, Rfl: PRN .  COD LIVER OIL W/VIT A & D PO, Take 1 capsule by mouth at bedtime., Disp: , Rfl:  .  Coenzyme Q10 (COQ-10) 100 MG CAPS, Take 100 mg by mouth at bedtime., Disp: , Rfl:  .  cyclobenzaprine (FLEXERIL) 10 MG tablet, Take 1 tablet (10 mg total) by mouth at bedtime as needed for muscle spasms., Disp: 90 tablet, Rfl: 3 .  gabapentin (NEURONTIN) 600 MG tablet, Take 1 tablet (600 mg total) by mouth at bedtime. (Patient taking differently: Take 1,200 mg by mouth at bedtime. ), Disp: 90 tablet, Rfl: 3 .  hydrochlorothiazide (HYDRODIURIL) 25 MG tablet, Take 1 tablet (25 mg total) by mouth daily with breakfast., Disp: 90 tablet, Rfl: 3 .  linagliptin (TRADJENTA) 5 MG TABS tablet, Take 1 tablet (5 mg total) by mouth daily. (Patient taking differently:  Take 5 mg by mouth at bedtime. ), Disp: 90 tablet, Rfl: 3 .  losartan (COZAAR) 100 MG tablet, Take 1 tablet (100 mg total) by mouth daily. (Patient taking differently: Take 100 mg by mouth at bedtime. ), Disp: 90 tablet, Rfl: 3 .  Magnesium 250 MG TABS, Take 250 mg by mouth at bedtime., Disp: , Rfl:  .  meloxicam (MOBIC) 15 MG tablet, Take 1 tablet (15 mg total) by mouth daily., Disp: 30 tablet, Rfl: 2 .  metFORMIN (GLUCOPHAGE) 500 MG tablet, Take 2 tablets (1,000 mg total) by mouth 2 (two) times daily with a meal., Disp: 360 tablet, Rfl: 3 .  MILK THISTLE PO, Take 1-2 tablets by mouth See admin instructions. Take 1 tablet in the morning and 2 tablets at night, Disp: , Rfl:  .  Multiple Vitamins-Minerals (CENTRUM SILVER PO), Take  1 tablet by mouth at bedtime., Disp: , Rfl:  .  naproxen sodium (ALEVE) 220 MG tablet, Take 440 mg by mouth 2 (two) times daily as needed (pain)., Disp: , Rfl:  .  omega-3 acid ethyl esters (LOVAZA) 1 g capsule, Take 1 capsule (1 g total) by mouth 2 (two) times daily., Disp: 180 capsule, Rfl: 0 .  Potassium 99 MG TABS, Take 99 mg by mouth 2 (two) times a day., Disp: , Rfl:  .  TURMERIC PO, Take 1,500 mg by mouth 2 (two) times a day., Disp: , Rfl:  .  vitamin C (ASCORBIC ACID) 500 MG tablet, Take 500 mg by mouth 2 (two) times a day., Disp: , Rfl:   Allergies  Allergen Reactions  . Cymbalta [Duloxetine Hcl] Other (See Comments)    Nervous  . Gabapentin Other (See Comments)    Gait instability, dizziness; tolerates QHS dosing  . Sulfa Antibiotics Other (See Comments)    Unknown reaction   Review of Systems Objective:  There were no vitals filed for this visit.  General: Well developed, nourished, in no acute distress, alert and oriented x3   Dermatological: Skin is warm, dry and supple bilateral. Nails x 10 are well maintained; remaining integument appears unremarkable at this time. There are no open sores, no preulcerative lesions, no rash or signs of infection present.  Vascular: Dorsalis Pedis artery and Posterior Tibial artery pedal pulses are 2/4 bilateral with immedate capillary fill time. Pedal hair growth present. No varicosities and no lower extremity edema present bilateral.   Neruologic: Grossly intact via light touch bilateral. Vibratory intact via tuning fork bilateral. Protective threshold with Semmes Wienstein monofilament intact to all pedal sites bilateral. Patellar and Achilles deep tendon reflexes 2+ bilateral. No Babinski or clonus noted bilateral.   Musculoskeletal: No gross boney pedal deformities bilateral. No pain, crepitus, or limitation noted with foot and ankle range of motion bilateral. Muscular strength 5/5 in all groups tested bilateral.  Pronation bilateral  right foot over the left.  Medial collapse of the foot.  Pain on palpation medial aspect of the right foot over the left  Gait: Unassisted, Nonantalgic.    Radiographs:  Radiographs bilateral taken today demonstrate an osseously mature individual significant osteoarthritic changes of the midfoot and medial deviation of the toes osteopenia is noted.  Plantar distally oriented calcaneal heel spurs soft tissue swelling of the anterior ankle.  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  I really feel that the majority of this problem has been exacerbated with her previous injury.     Brissa Asante T. [place], [place]

## 2021-01-30 ENCOUNTER — Telehealth: Payer: Self-pay | Admitting: Podiatry

## 2021-01-30 ENCOUNTER — Ambulatory Visit (INDEPENDENT_AMBULATORY_CARE_PROVIDER_SITE_OTHER): Payer: PPO | Admitting: Podiatry

## 2021-01-30 ENCOUNTER — Other Ambulatory Visit: Payer: Self-pay

## 2021-01-30 DIAGNOSIS — M7751 Other enthesopathy of right foot: Secondary | ICD-10-CM | POA: Diagnosis not present

## 2021-01-30 MED ORDER — TRIAMCINOLONE ACETONIDE 40 MG/ML IJ SUSP
20.0000 mg | Freq: Once | INTRAMUSCULAR | Status: AC
  Administered 2021-01-30: 20 mg

## 2021-01-30 NOTE — Telephone Encounter (Signed)
Called pt about diabetic shoes status but mail box is full so I am not able to leave message. We have not received the documents from pcp and that is what is holding up the shoes. Once we get documents from pcp I have to get auth from [facility] as well.
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
## 2021-02-14 DIAGNOSIS — M8589 Other specified disorders of bone density and structure, multiple sites: Secondary | ICD-10-CM | POA: Diagnosis not present

## 2021-02-14 DIAGNOSIS — M85852 Other specified disorders of bone density and structure, left thigh: Secondary | ICD-10-CM | POA: Diagnosis not present

## 2021-02-14 DIAGNOSIS — Z1231 Encounter for screening mammogram for malignant neoplasm of breast: Secondary | ICD-10-CM | POA: Diagnosis not present

## 2021-03-11 DIAGNOSIS — E113292 Type 2 diabetes mellitus with mild nonproliferative diabetic retinopathy without macular edema, left eye: Secondary | ICD-10-CM | POA: Diagnosis not present

## 2021-03-11 DIAGNOSIS — E1159 Type 2 diabetes mellitus with other circulatory complications: Secondary | ICD-10-CM | POA: Diagnosis not present

## 2021-03-11 DIAGNOSIS — Z23 Encounter for immunization: Secondary | ICD-10-CM | POA: Diagnosis not present

## 2021-03-11 DIAGNOSIS — Z0000 Encounter for general adult medical examination without abnormal findings: Secondary | ICD-10-CM | POA: Diagnosis not present

## 2021-03-11 DIAGNOSIS — M48062 Spinal stenosis, lumbar region with neurogenic claudication: Secondary | ICD-10-CM | POA: Diagnosis not present

## 2021-03-11 DIAGNOSIS — E1140 Type 2 diabetes mellitus with diabetic neuropathy, unspecified: Secondary | ICD-10-CM | POA: Diagnosis not present

## 2021-03-11 DIAGNOSIS — E785 Hyperlipidemia, unspecified: Secondary | ICD-10-CM | POA: Diagnosis not present

## 2021-03-11 DIAGNOSIS — I152 Hypertension secondary to endocrine disorders: Secondary | ICD-10-CM | POA: Diagnosis not present

## 2021-03-11 DIAGNOSIS — M8580 Other specified disorders of bone density and structure, unspecified site: Secondary | ICD-10-CM | POA: Diagnosis not present

## 2021-03-11 DIAGNOSIS — E1169 Type 2 diabetes mellitus with other specified complication: Secondary | ICD-10-CM | POA: Diagnosis not present

## 2021-03-11 DIAGNOSIS — Z6839 Body mass index (BMI) 39.0-39.9, adult: Secondary | ICD-10-CM | POA: Diagnosis not present

## 2021-03-11 DIAGNOSIS — Z6841 Body Mass Index (BMI) 40.0 and over, adult: Secondary | ICD-10-CM | POA: Diagnosis not present

## 2021-03-11 DIAGNOSIS — Z2820 Immunization not carried out because of patient decision for unspecified reason: Secondary | ICD-10-CM | POA: Diagnosis not present

## 2021-03-11 DIAGNOSIS — E113291 Type 2 diabetes mellitus with mild nonproliferative diabetic retinopathy without macular edema, right eye: Secondary | ICD-10-CM | POA: Diagnosis not present

## 2021-04-07 DIAGNOSIS — E1159 Type 2 diabetes mellitus with other circulatory complications: Secondary | ICD-10-CM | POA: Diagnosis not present

## 2021-04-07 DIAGNOSIS — I152 Hypertension secondary to endocrine disorders: Secondary | ICD-10-CM | POA: Diagnosis not present

## 2021-04-24 DIAGNOSIS — G4700 Insomnia, unspecified: Secondary | ICD-10-CM | POA: Diagnosis not present

## 2021-04-24 DIAGNOSIS — G4733 Obstructive sleep apnea (adult) (pediatric): Secondary | ICD-10-CM | POA: Diagnosis not present

## 2021-05-01 DIAGNOSIS — H52223 Regular astigmatism, bilateral: Secondary | ICD-10-CM | POA: Diagnosis not present

## 2021-05-01 DIAGNOSIS — H35033 Hypertensive retinopathy, bilateral: Secondary | ICD-10-CM | POA: Diagnosis not present

## 2021-05-01 DIAGNOSIS — H5213 Myopia, bilateral: Secondary | ICD-10-CM | POA: Diagnosis not present

## 2021-05-01 DIAGNOSIS — H524 Presbyopia: Secondary | ICD-10-CM | POA: Diagnosis not present

## 2021-05-01 DIAGNOSIS — H2513 Age-related nuclear cataract, bilateral: Secondary | ICD-10-CM | POA: Diagnosis not present

## 2021-05-01 DIAGNOSIS — H0102B Squamous blepharitis left eye, upper and lower eyelids: Secondary | ICD-10-CM | POA: Diagnosis not present

## 2021-05-01 DIAGNOSIS — H16143 Punctate keratitis, bilateral: Secondary | ICD-10-CM | POA: Diagnosis not present

## 2021-05-01 DIAGNOSIS — E119 Type 2 diabetes mellitus without complications: Secondary | ICD-10-CM | POA: Diagnosis not present

## 2021-05-01 DIAGNOSIS — H0102A Squamous blepharitis right eye, upper and lower eyelids: Secondary | ICD-10-CM | POA: Diagnosis not present

## 2021-05-01 DIAGNOSIS — H35012 Changes in retinal vascular appearance, left eye: Secondary | ICD-10-CM | POA: Diagnosis not present

## 2021-05-12 DIAGNOSIS — I152 Hypertension secondary to endocrine disorders: Secondary | ICD-10-CM | POA: Diagnosis not present

## 2021-05-12 DIAGNOSIS — E1159 Type 2 diabetes mellitus with other circulatory complications: Secondary | ICD-10-CM | POA: Diagnosis not present

## 2021-05-14 ENCOUNTER — Encounter (INDEPENDENT_AMBULATORY_CARE_PROVIDER_SITE_OTHER): Payer: PPO | Admitting: Ophthalmology

## 2021-05-20 ENCOUNTER — Encounter (INDEPENDENT_AMBULATORY_CARE_PROVIDER_SITE_OTHER): Payer: Self-pay | Admitting: Ophthalmology

## 2021-05-20 ENCOUNTER — Other Ambulatory Visit: Payer: Self-pay

## 2021-05-20 ENCOUNTER — Ambulatory Visit (INDEPENDENT_AMBULATORY_CARE_PROVIDER_SITE_OTHER): Payer: PPO | Admitting: Ophthalmology

## 2021-05-20 DIAGNOSIS — H3581 Retinal edema: Secondary | ICD-10-CM | POA: Diagnosis not present

## 2021-05-20 DIAGNOSIS — I10 Essential (primary) hypertension: Secondary | ICD-10-CM | POA: Diagnosis not present

## 2021-05-20 DIAGNOSIS — H3562 Retinal hemorrhage, left eye: Secondary | ICD-10-CM | POA: Diagnosis not present

## 2021-05-20 DIAGNOSIS — H35033 Hypertensive retinopathy, bilateral: Secondary | ICD-10-CM

## 2021-05-20 DIAGNOSIS — H25813 Combined forms of age-related cataract, bilateral: Secondary | ICD-10-CM

## 2021-05-20 DIAGNOSIS — E119 Type 2 diabetes mellitus without complications: Secondary | ICD-10-CM

## 2021-05-20 NOTE — Progress Notes (Signed)
[facility] Note  05/20/2021     CHIEF COMPLAINT Patient presents for Retina Evaluation   HISTORY OF PRESENT ILLNESS: Karina Davis is a 75 y.o. female who presents to the clinic today for:  HPI     Retina Evaluation   In left eye.  I, the attending physician,  performed the HPI with the patient and updated documentation appropriately.        Comments   Patient here for Retina Evaluation. Referred by Theodosia Quay. Patient states vision can see good. No eye pain. Has little twinges of pain at times.has new RX for glasses not filled yet. Ho headaches.  Gwenlyn Perking saw something behind OS. Last a1c was 8.3, checked 2 months ago. Patient states 8.3 is unusually high for her. A1c is usually low 7's.      Last edited by Bernarda Caffey, MD on 05/20/2021  3:13 PM.    Referred last wk by Gwenlyn Perking to evaluate cluster of vessels OS  Referring physician: Shirleen Schirmer, PA-C  [facility] [address] [place],  [place] [address]  HISTORICAL INFORMATION:   Selected notes from the MEDICAL RECORD NUMBER Referred by Gwenlyn Perking for eval of OS clump narrow neovascular bloodvessels seating in or around retina   CURRENT MEDICATIONS: No current outpatient medications on file. (Ophthalmic Drugs)   No current facility-administered medications for this visit. (Ophthalmic Drugs)   Current Outpatient Medications (Other)  Medication Sig   aspirin 81 MG tablet Take 81 mg by mouth at bedtime.    atorvastatin (LIPITOR) 40 MG tablet Take 1 tablet (40 mg total) by mouth daily. (Patient taking differently: Take 20 mg by mouth every evening. )   azelastine (ASTELIN) 0.1 % nasal spray Place 2 sprays into both nostrils 2 (two) times daily. Use in each nostril as directed (Patient taking differently: Place 2 sprays into both nostrils 2 (two) times daily as needed for rhinitis. Use in each nostril as directed)   b complex vitamins tablet Take 1 tablet daily by  mouth.   calcium carbonate (TUMS - DOSED IN MG ELEMENTAL CALCIUM) 500 MG chewable tablet Chew 1-2 tablets by mouth daily as needed for indigestion or heartburn.   CALCIUM CITRATE-VITAMIN D PO Take 1 tablet by mouth 2 (two) times a day.    chlorhexidine (PERIDEX) 0.12 % solution USE AS DIRECTED, USE 15 MLS IN THE MOUTH OR THROAT 2 TIMES A DAY (Patient taking differently: Use as directed 15 mLs in the mouth or throat 2 (two) times daily as needed (oral soreness). )   COD LIVER OIL W/VIT A & D PO Take 1 capsule by mouth at bedtime.   Coenzyme Q10 (COQ-10) 100 MG CAPS Take 100 mg by mouth at bedtime.   cyclobenzaprine (FLEXERIL) 10 MG tablet Take 1 tablet (10 mg total) by mouth at bedtime as needed for muscle spasms.   gabapentin (NEURONTIN) 600 MG tablet Take 1 tablet (600 mg total) by mouth at bedtime. (Patient taking differently: Take 1,200 mg by mouth at bedtime. )   hydrochlorothiazide (HYDRODIURIL) 25 MG tablet Take 1 tablet (25 mg total) by mouth daily with breakfast.   linagliptin (TRADJENTA) 5 MG TABS tablet Take 1 tablet (5 mg total) by mouth daily. (Patient taking differently: Take 5 mg by mouth at bedtime. )   losartan (COZAAR) 100 MG tablet Take 1 tablet (100 mg total) by mouth daily. (Patient taking differently: Take 100 mg by mouth at bedtime. )   Magnesium  250 MG TABS Take 250 mg by mouth at bedtime.   meloxicam (MOBIC) 15 MG tablet Take 1 tablet (15 mg total) by mouth daily.   metFORMIN (GLUCOPHAGE) 500 MG tablet Take 2 tablets (1,000 mg total) by mouth 2 (two) times daily with a meal.   MILK THISTLE PO Take 1-2 tablets by mouth See admin instructions. Take 1 tablet in the morning and 2 tablets at night   Multiple Vitamins-Minerals (CENTRUM SILVER PO) Take 1 tablet by mouth at bedtime.   naproxen sodium (ALEVE) 220 MG tablet Take 440 mg by mouth 2 (two) times daily as needed (pain).   omega-3 acid ethyl esters (LOVAZA) 1 g capsule Take 1 capsule (1 g total) by mouth 2 (two) times daily.    Potassium 99 MG TABS Take 99 mg by mouth 2 (two) times a day.   TURMERIC PO Take 1,500 mg by mouth 2 (two) times a day.   vitamin C (ASCORBIC ACID) 500 MG tablet Take 500 mg by mouth 2 (two) times a day.   No current facility-administered medications for this visit. (Other)      REVIEW OF SYSTEMS: ROS   Positive for: Endocrine, Eyes Negative for: Constitutional, Gastrointestinal, Neurological, Skin, Genitourinary, Musculoskeletal, HENT, Cardiovascular, Respiratory, Psychiatric, Allergic/Imm, Heme/Lymph Last edited by Roselee Nova D, COT on 05/20/2021  1:50 PM.       ALLERGIES Allergies  Allergen Reactions   Cymbalta [Duloxetine Hcl] Other (See Comments)    Nervous   Gabapentin Other (See Comments)    Gait instability, dizziness; tolerates QHS dosing   Sulfa Antibiotics Other (See Comments)    Unknown reaction    PAST MEDICAL HISTORY Past Medical History:  Diagnosis Date   Ankle fracture    Diabetes mellitus without complication ([place])    Diabetic neuropathy ([place]) 03/30/2017   GERD (gastroesophageal reflux disease)    Hypertension    Neuropathy    Obesity    Sleep apnea    cpap   Past Surgical History:  Procedure Laterality Date   APPENDECTOMY  1955   childhood   COLON SURGERY     KNEE ARTHROSCOPY WITH LATERAL MENISECTOMY Left 05/15/2019   Procedure: LEFT KNEE ARTHOSCOPY PARTIAL MENISCECTOMY, REMOVAL OF LOOSE BODY;  Surgeon: Marybelle Killings, MD;  Location: [facility];  Service: Orthopedics;  Laterality: Left;    FAMILY HISTORY Family History  Problem Relation Age of Onset   Cancer Mother 86       Colon   Colon cancer Mother 71   Stroke Father 9   Diabetes Father    Heart disease Father    Hypertension Father    Hypertension Sister    Diabetes Sister    Kidney disease Sister        due to HTN and DM   Hypertension Brother    Stroke Brother    Hypertension Daughter    Diabetes Daughter    Obesity Daughter        gastric sleeve surgery 03/05/2014    Hypertension Sister    Drug abuse Sister        narcotic abuse   Stroke Sister        associated with narcotic abuse   Diabetes Sister    Hypertension Sister    Hyperlipidemia Sister    Hypertension Brother    Drug abuse Son        cocaine   Stroke Brother    Hypertension Brother    Esophageal cancer Neg Hx    Rectal cancer  Neg Hx    Stomach cancer Neg Hx     SOCIAL HISTORY Social History   Tobacco Use   Smoking status: Never   Smokeless tobacco: Never  Vaping Use   Vaping Use: Never used  Substance Use Topics   Alcohol use: Yes    Comment: Socially   Drug use: No         OPHTHALMIC EXAM:  Base Eye Exam     Visual Acuity (Snellen - Linear)       Right Left   Dist [place] 20/30 20/40 +1   Dist ph [place] NI 20/30         Tonometry (Tonopen, 1:41 PM)       Right Left   Pressure 14 11         Pupils       Dark Light Shape React APD   Right 3 2 Round Brisk None   Left 3 2 Round Brisk None         Visual Fields (Counting fingers)       Left Right    Full Full         Extraocular Movement       Right Left    Full, Ortho Full, Ortho         Neuro/Psych     Oriented x3: Yes   Mood/Affect: Normal         Dilation     Both eyes: 1.0% Mydriacyl, 2.5% Phenylephrine @ 1:40 PM           Slit Lamp and Fundus Exam     Slit Lamp Exam       Right Left   Lids/Lashes Dermato Dermato   Conjunctiva/Sclera Nasal and temporal mild pinguecula Nasal and temporal mild pinguecula   Cornea Trace tear film debris Trace tear film debris   Anterior Chamber Deep and quiet Deep and quiet   Iris Round and dilated, no NVI Round and dilated, no NVI   Lens 2-3 NS, 2-3 cortical 2-3 NS, 2-3 cortical, Trace PSC   Vitreous Synerisis Synerisis, PVD, mild condensations         Fundus Exam       Right Left   Disc Pink, sharp, compact Pink, sharp, mild PPA   C/D Ratio 0.1 0.2   Macula flat, blunted foveal reflex, mild RPE mottling, no heme or edema Flat,  blunted foveal reflex, focal flame heme inferior to fovea, mild RPE motling, no edema   Vessels Attenuated, tortuous Attenuated, tortuous   Periphery Attached, no heme Attached, no heme           Refraction     Manifest Refraction       Sphere Cylinder Axis Dist VA   Right -1.25 +0.50 035 20/25-2   Left -1.50 +0.50 145 20/30-2            IMAGING AND PROCEDURES  Imaging and Procedures for 05/20/2021  OCT, Retina - OU - Both Eyes       Right Eye Quality was good. Central Foveal Thickness: 294. Progression has no prior data. Findings include normal foveal contour, no IRF, no SRF.   Left Eye Quality was good. Central Foveal Thickness: 289. Progression has no prior data. Findings include normal foveal contour, no IRF, no SRF.   Notes *Images captured and stored on drive  Diagnosis / Impression:  OU: NFP, No IRF/SRF  Clinical management:  See below  Abbreviations: NFP - Normal foveal profile. CME - cystoid macular edema.  PED - pigment epithelial detachment. IRF - intraretinal fluid. SRF - subretinal fluid. EZ - ellipsoid zone. ERM - epiretinal membrane. ORA - outer retinal atrophy. ORT - outer retinal tubulation. SRHM - subretinal hyper-reflective material. IRHM - intraretinal hyper-reflective material      Fluorescein Angiography Optos (Transit OS)       Right Eye Progression has no prior data. Early phase findings include normal observations. Mid/Late phase findings include normal observations (No MA).   Left Eye Progression has no prior data. Early phase findings include normal observations, staining, window defect. Mid/Late phase findings include normal observations, staining, window defect (No MA; focal hyperfluorescent staining/window defect nasal midzone).   Notes **Images stored on drive**  Impression: No MA OU -- DM w/o retinopathy OS: focal hyperfluorescent staining/window defect nasal midzone             ASSESSMENT/PLAN:    ICD-10-CM   1.  Retinal hemorrhage of left eye  H35.62     2. Diabetes mellitus type 2 without retinopathy ([place])  E11.9     3. Retinal edema  H35.81 OCT, Retina - OU - Both Eyes    4. Essential hypertension  I10     5. Hypertensive retinopathy of both eyes  H35.033 Fluorescein Angiography Optos (Transit OS)    6. Combined forms of age-related cataract of both eyes  H25.813      1. Retinal hemmorhage OS  - mild focal flame heme inferior to fovea -- likley related to HTN retinopathy  - no NV  - no ophthalmic intervention indicated or recommended at this time  - recommend observation - F/u in 6wks w/DFE/OCT  2,3. Diabetes mellitus, type 2 without retinopathy - The incidence, risk factors for progression, natural history and treatment options for diabetic retinopathy  were discussed with patient.   - The need for close monitoring of blood glucose, blood pressure, and serum lipids, avoiding cigarette or any type of tobacco, and the need for long term follow up was also discussed with patient. - FA 7.26.22 shows no MA no NV OU - f/u in 1 year, sooner prn   4,5. Hypertensive retinopathy OU - discussed importance of tight BP control - monitor   6. Mixed Cataract OU - The symptoms of cataract, surgical options, and treatments and risks were discussed with patient. - discussed diagnosis and progression - under the expert management of [place] - monitor   Ophthalmic Meds Ordered this visit:  No orders of the defined types were placed in this encounter.     Return in about 6 weeks (around 07/01/2021) for 6 wk f/u for flame heme OS w/DFE&OCT.  There are no Patient Instructions on file for this visit.  Explained the diagnoses, plan, and follow up with the patient and they expressed understanding.  Patient expressed understanding of the importance of proper follow up care.   This document serves as a record of services personally performed by Gardiner Sleeper, MD, PhD. It was created on their  behalf by Estill Bakes, COT an ophthalmic technician. The creation of this record is the provider's dictation and/or activities during the visit.    Electronically signed by: Estill Bakes, COT 7.26.22 @ 3:18 PM   Gardiner Sleeper, M.D., Ph.D. Diseases & Surgery of the Retina and [place] 7.26.22  I have reviewed the above documentation for accuracy and completeness, and I agree with the above. Gardiner Sleeper, M.D., Ph.D. 05/20/21 3:22 PM  Abbreviations: Jerilynn Mages myopia (  nearsighted); A astigmatism; H hyperopia (farsighted); P presbyopia; Mrx spectacle prescription;  CTL contact lenses; OD right eye; OS left eye; OU both eyes  XT exotropia; ET esotropia; PEK punctate epithelial keratitis; PEE punctate epithelial erosions; DES dry eye syndrome; MGD meibomian gland dysfunction; ATs artificial tears; PFAT's preservative free artificial tears; [place] nuclear sclerotic cataract; PSC posterior subcapsular cataract; ERM epi-retinal membrane; PVD posterior vitreous detachment; RD retinal detachment; DM diabetes mellitus; DR diabetic retinopathy; NPDR non-proliferative diabetic retinopathy; PDR proliferative diabetic retinopathy; CSME clinically significant macular edema; DME diabetic macular edema; dbh dot blot hemorrhages; CWS cotton wool spot; POAG primary open angle glaucoma; C/D cup-to-disc ratio; HVF humphrey visual field; GVF goldmann visual field; OCT optical coherence tomography; IOP intraocular pressure; BRVO Branch retinal vein occlusion; CRVO central retinal vein occlusion; CRAO central retinal artery occlusion; BRAO branch retinal artery occlusion; RT retinal tear; SB scleral buckle; PPV pars plana vitrectomy; VH Vitreous hemorrhage; PRP panretinal laser photocoagulation; IVK intravitreal kenalog; VMT vitreomacular traction; MH Macular hole;  NVD neovascularization of the disc; NVE neovascularization elsewhere; AREDS age related eye disease study; ARMD age related macular  degeneration; POAG primary open angle glaucoma; EBMD epithelial/anterior basement membrane dystrophy; ACIOL anterior chamber intraocular lens; IOL intraocular lens; PCIOL posterior chamber intraocular lens; Phaco/IOL phacoemulsification with intraocular lens placement; [place] photorefractive keratectomy; LASIK laser assisted in situ keratomileusis; HTN hypertension; DM diabetes mellitus; COPD chronic obstructive pulmonary disease

## 2021-05-26 ENCOUNTER — Other Ambulatory Visit: Payer: PPO

## 2021-05-27 ENCOUNTER — Telehealth: Payer: Self-pay | Admitting: Podiatry

## 2021-05-27 NOTE — Telephone Encounter (Signed)
Pt left message stating she apologizes for missing her appt yesterday but was at the hospital with her daughter.   I returned call and r/s pt for 8.18. I also refaxed [employer] for a updated authorization as hers expires 8.8

## 2021-06-05 ENCOUNTER — Telehealth: Payer: Self-pay | Admitting: Podiatry

## 2021-06-05 NOTE — Telephone Encounter (Signed)
Received [employer]ta auth # [id number] for diabetic shoes and inserts([id number] X2/A5514 x6) valid 8.9.2022 thru 11.7.2022.Marland KitchenMarland Kitchen

## 2021-06-12 ENCOUNTER — Ambulatory Visit (INDEPENDENT_AMBULATORY_CARE_PROVIDER_SITE_OTHER): Payer: PPO | Admitting: *Deleted

## 2021-06-12 ENCOUNTER — Other Ambulatory Visit: Payer: Self-pay

## 2021-06-12 DIAGNOSIS — Q666 Other congenital valgus deformities of feet: Secondary | ICD-10-CM

## 2021-06-12 DIAGNOSIS — E1141 Type 2 diabetes mellitus with diabetic mononeuropathy: Secondary | ICD-10-CM

## 2021-06-12 NOTE — Progress Notes (Signed)
Patient presents today to pick up diabetic shoes and insoles.  Patient was dispensed 1 pair of diabetic shoes and 3 pairs of foam casted diabetic insoles. Fit was satisfactory. Instructions for break-in and wear was reviewed and a copy was given to the patient.   Re-appointment for regularly scheduled diabetic foot care visits or if they should experience any trouble with the shoes or insoles.   ~Patient had a blister on the plantar aspect of the hallux right. It was superficial and she did had it covered with a bandaid and some betadine ointment. She noticed this 2 days ago. I re-dressed the area with cosmopor 2x2 bandage and medihoney. I gave her a small syringe full to take with her. I covered with a toe cap for cushion and to hold her bandages in place. Advised she evaluate every day for signs of infections and to call us immediately if any become present.

## 2021-06-19 DIAGNOSIS — Z2821 Immunization not carried out because of patient refusal: Secondary | ICD-10-CM | POA: Diagnosis not present

## 2021-06-19 DIAGNOSIS — E1159 Type 2 diabetes mellitus with other circulatory complications: Secondary | ICD-10-CM | POA: Diagnosis not present

## 2021-06-19 DIAGNOSIS — M48062 Spinal stenosis, lumbar region with neurogenic claudication: Secondary | ICD-10-CM | POA: Diagnosis not present

## 2021-06-19 DIAGNOSIS — E1169 Type 2 diabetes mellitus with other specified complication: Secondary | ICD-10-CM | POA: Diagnosis not present

## 2021-06-19 DIAGNOSIS — K029 Dental caries, unspecified: Secondary | ICD-10-CM | POA: Diagnosis not present

## 2021-06-19 DIAGNOSIS — E1140 Type 2 diabetes mellitus with diabetic neuropathy, unspecified: Secondary | ICD-10-CM | POA: Diagnosis not present

## 2021-06-19 DIAGNOSIS — I152 Hypertension secondary to endocrine disorders: Secondary | ICD-10-CM | POA: Diagnosis not present

## 2021-06-19 DIAGNOSIS — E1165 Type 2 diabetes mellitus with hyperglycemia: Secondary | ICD-10-CM | POA: Diagnosis not present

## 2021-06-19 DIAGNOSIS — E113291 Type 2 diabetes mellitus with mild nonproliferative diabetic retinopathy without macular edema, right eye: Secondary | ICD-10-CM | POA: Diagnosis not present

## 2021-06-19 DIAGNOSIS — S91109A Unspecified open wound of unspecified toe(s) without damage to nail, initial encounter: Secondary | ICD-10-CM | POA: Diagnosis not present

## 2021-06-19 DIAGNOSIS — E785 Hyperlipidemia, unspecified: Secondary | ICD-10-CM | POA: Diagnosis not present

## 2021-07-01 ENCOUNTER — Encounter (INDEPENDENT_AMBULATORY_CARE_PROVIDER_SITE_OTHER): Payer: Self-pay | Admitting: Ophthalmology

## 2021-07-01 ENCOUNTER — Other Ambulatory Visit: Payer: Self-pay

## 2021-07-01 ENCOUNTER — Ambulatory Visit (INDEPENDENT_AMBULATORY_CARE_PROVIDER_SITE_OTHER): Payer: PPO | Admitting: Ophthalmology

## 2021-07-01 DIAGNOSIS — I10 Essential (primary) hypertension: Secondary | ICD-10-CM | POA: Diagnosis not present

## 2021-07-01 DIAGNOSIS — H3581 Retinal edema: Secondary | ICD-10-CM

## 2021-07-01 DIAGNOSIS — E119 Type 2 diabetes mellitus without complications: Secondary | ICD-10-CM

## 2021-07-01 DIAGNOSIS — H25813 Combined forms of age-related cataract, bilateral: Secondary | ICD-10-CM | POA: Diagnosis not present

## 2021-07-01 DIAGNOSIS — H35033 Hypertensive retinopathy, bilateral: Secondary | ICD-10-CM

## 2021-07-01 DIAGNOSIS — H3562 Retinal hemorrhage, left eye: Secondary | ICD-10-CM

## 2021-07-01 NOTE — Progress Notes (Signed)
[facility] Note  07/01/2021     CHIEF COMPLAINT Patient presents for Retina Follow Up   HISTORY OF PRESENT ILLNESS: Karina Davis is a 75 y.o. female who presents to the clinic today for:  HPI     Retina Follow Up   Patient presents with  Other.  In left eye.  Duration of 6 weeks.  Since onset it is stable.  I, the attending physician,  performed the HPI with the patient and updated documentation appropriately.        Comments   6 week follow up Retinal heme OS-  Pt has a glasses Rx but has not filled it yet.  Doesn't think vision has changed since last visit.   A1C 13 from 8.3 She has been really thirsty.  She drinks so much water that she switched to soft drinks.  She believes this is the cause of A1C going up. BS hasn't checked she needs a new meter.        Last edited by Bernarda Caffey, MD on 07/01/2021 10:10 PM.    Pt states distance vision is not as clear, but says she might just be more aware of her vision  Referring physician: Shirleen Schirmer, PA-C  [facility] [address] [place],  [place] [address]  HISTORICAL INFORMATION:   Selected notes from the MEDICAL RECORD NUMBER Referred by Shirleen Schirmer, PA-C for eval of OS clump narrow neovascular blood vessels   CURRENT MEDICATIONS: No current outpatient medications on file. (Ophthalmic Drugs)   No current facility-administered medications for this visit. (Ophthalmic Drugs)   Current Outpatient Medications (Other)  Medication Sig   aspirin 81 MG tablet Take 81 mg by mouth at bedtime.    atorvastatin (LIPITOR) 40 MG tablet Take 1 tablet (40 mg total) by mouth daily. (Patient taking differently: Take 20 mg by mouth every evening.)   azelastine (ASTELIN) 0.1 % nasal spray Place 2 sprays into both nostrils 2 (two) times daily. Use in each nostril as directed (Patient taking differently: Place 2 sprays into both nostrils 2 (two) times daily as needed for rhinitis.  Use in each nostril as directed)   b complex vitamins tablet Take 1 tablet daily by mouth.   calcium carbonate (TUMS - DOSED IN MG ELEMENTAL CALCIUM) 500 MG chewable tablet Chew 1-2 tablets by mouth daily as needed for indigestion or heartburn.   CALCIUM CITRATE-VITAMIN D PO Take 1 tablet by mouth 2 (two) times a day.    chlorhexidine (PERIDEX) 0.12 % solution USE AS DIRECTED, USE 15 MLS IN THE MOUTH OR THROAT 2 TIMES A DAY (Patient taking differently: Use as directed 15 mLs in the mouth or throat 2 (two) times daily as needed (oral soreness).)   COD LIVER OIL W/VIT A & D PO Take 1 capsule by mouth at bedtime.   Coenzyme Q10 (COQ-10) 100 MG CAPS Take 100 mg by mouth at bedtime.   cyclobenzaprine (FLEXERIL) 10 MG tablet Take 1 tablet (10 mg total) by mouth at bedtime as needed for muscle spasms.   gabapentin (NEURONTIN) 600 MG tablet Take 1 tablet (600 mg total) by mouth at bedtime. (Patient taking differently: Take 1,200 mg by mouth at bedtime.)   hydrochlorothiazide (HYDRODIURIL) 25 MG tablet Take 1 tablet (25 mg total) by mouth daily with breakfast.   linagliptin (TRADJENTA) 5 MG TABS tablet Take 1 tablet (5 mg total) by mouth daily. (Patient taking differently: Take 5 mg by mouth at  bedtime.)   losartan (COZAAR) 100 MG tablet Take 1 tablet (100 mg total) by mouth daily. (Patient taking differently: Take 100 mg by mouth at bedtime.)   Magnesium 250 MG TABS Take 250 mg by mouth at bedtime.   meloxicam (MOBIC) 15 MG tablet Take 1 tablet (15 mg total) by mouth daily.   metFORMIN (GLUCOPHAGE) 500 MG tablet Take 2 tablets (1,000 mg total) by mouth 2 (two) times daily with a meal.   MILK THISTLE PO Take 1-2 tablets by mouth See admin instructions. Take 1 tablet in the morning and 2 tablets at night   Multiple Vitamins-Minerals (CENTRUM SILVER PO) Take 1 tablet by mouth at bedtime.   naproxen sodium (ALEVE) 220 MG tablet Take 440 mg by mouth 2 (two) times daily as needed (pain).   omega-3 acid ethyl  esters (LOVAZA) 1 g capsule Take 1 capsule (1 g total) by mouth 2 (two) times daily.   Potassium 99 MG TABS Take 99 mg by mouth 2 (two) times a day.   TURMERIC PO Take 1,500 mg by mouth 2 (two) times a day.   vitamin C (ASCORBIC ACID) 500 MG tablet Take 500 mg by mouth 2 (two) times a day.   No current facility-administered medications for this visit. (Other)      REVIEW OF SYSTEMS: ROS   Positive for: Gastrointestinal, Endocrine, Eyes, Respiratory Negative for: Constitutional, Neurological, Skin, Genitourinary, Musculoskeletal, HENT, Cardiovascular, Psychiatric, Allergic/Imm, Heme/Lymph Last edited by Leonie Douglas, COA on 07/01/2021  2:46 PM.        ALLERGIES Allergies  Allergen Reactions   Cymbalta [Duloxetine Hcl] Other (See Comments)    Nervous   Gabapentin Other (See Comments)    Gait instability, dizziness; tolerates QHS dosing   Sulfa Antibiotics Other (See Comments)    Unknown reaction    PAST MEDICAL HISTORY Past Medical History:  Diagnosis Date   Ankle fracture    Diabetes mellitus without complication ([place])    Diabetic neuropathy ([place]) 03/30/2017   GERD (gastroesophageal reflux disease)    Hypertension    Neuropathy    Obesity    Sleep apnea    cpap   Past Surgical History:  Procedure Laterality Date   APPENDECTOMY  1955   childhood   COLON SURGERY     KNEE ARTHROSCOPY WITH LATERAL MENISECTOMY Left 05/15/2019   Procedure: LEFT KNEE ARTHOSCOPY PARTIAL MENISCECTOMY, REMOVAL OF LOOSE BODY;  Surgeon: Marybelle Killings, MD;  Location: [facility];  Service: Orthopedics;  Laterality: Left;    FAMILY HISTORY Family History  Problem Relation Age of Onset   Cancer Mother 16       Colon   Colon cancer Mother 19   Stroke Father 67   Diabetes Father    Heart disease Father    Hypertension Father    Hypertension Sister    Diabetes Sister    Kidney disease Sister        due to HTN and DM   Hypertension Brother    Stroke Brother    Hypertension Daughter    Diabetes  Daughter    Obesity Daughter        gastric sleeve surgery 03/05/2014   Hypertension Sister    Drug abuse Sister        narcotic abuse   Stroke Sister        associated with narcotic abuse   Diabetes Sister    Hypertension Sister    Hyperlipidemia Sister    Hypertension Brother    Drug abuse  Son        cocaine   Stroke Brother    Hypertension Brother    Esophageal cancer Neg Hx    Rectal cancer Neg Hx    Stomach cancer Neg Hx     SOCIAL HISTORY Social History   Tobacco Use   Smoking status: Never   Smokeless tobacco: Never  Vaping Use   Vaping Use: Never used  Substance Use Topics   Alcohol use: Yes    Comment: Socially   Drug use: No         OPHTHALMIC EXAM:  Base Eye Exam     Visual Acuity (Snellen - Linear)       Right Left   Dist [place] 20/40 20/70   Dist ph [place] 20/30 20/40         Tonometry (Tonopen, 3:00 PM)       Right Left   Pressure 13 9         Pupils       Dark Light Shape React APD   Right 4 3 Round Brisk None   Left 4 3 Round Brisk None         Visual Fields (Counting fingers)       Left Right    Full Full         Extraocular Movement       Right Left    Full Full         Neuro/Psych     Oriented x3: Yes   Mood/Affect: Normal         Dilation     Both eyes: 1.0% Mydriacyl, 2.5% Phenylephrine @ 3:00 PM           Slit Lamp and Fundus Exam     Slit Lamp Exam       Right Left   Lids/Lashes Dermato Dermato   Conjunctiva/Sclera Nasal and temporal mild pinguecula Nasal and temporal mild pinguecula   Cornea Trace tear film debris Trace tear film debris   Anterior Chamber Deep and quiet Deep and quiet   Iris Round and dilated, no NVI Round and dilated, no NVI   Lens 2-3 NS, 2-3 cortical 2-3 NS, 2-3 cortical, Trace PSC   Vitreous Synerisis Synerisis, PVD, mild condensations         Fundus Exam       Right Left   Disc Pink, sharp, compact Pink, sharp, mild PPA   C/D Ratio 0.1 0.2   Macula flat,  blunted foveal reflex, mild RPE mottling, no heme or edema Flat, blunted foveal reflex, focal flame heme inferior to fovea - improving/fading, mild RPE motling, no edema   Vessels Attenuated, tortuous Attenuated, tortuous   Periphery Attached, no heme Attached, no heme           Refraction     Manifest Refraction       Sphere Cylinder Dist VA   Right -1.25 Sphere 20/25   Left -1.50 Sphere 20/30            IMAGING AND PROCEDURES  Imaging and Procedures for 07/01/2021  OCT, Retina - OU - Both Eyes       Right Eye Quality was good. Central Foveal Thickness: 287. Progression has been stable. Findings include normal foveal contour, no IRF, no SRF.   Left Eye Quality was good. Central Foveal Thickness: 281. Progression has been stable. Findings include normal foveal contour, no IRF, no SRF.   Notes *Images captured and stored on drive  Diagnosis /  Impression:  OU: NFP, No IRF/SRF  Clinical management:  See below  Abbreviations: NFP - Normal foveal profile. CME - cystoid macular edema. PED - pigment epithelial detachment. IRF - intraretinal fluid. SRF - subretinal fluid. EZ - ellipsoid zone. ERM - epiretinal membrane. ORA - outer retinal atrophy. ORT - outer retinal tubulation. SRHM - subretinal hyper-reflective material. IRHM - intraretinal hyper-reflective material               ASSESSMENT/PLAN:    ICD-10-CM   1. Retinal hemorrhage of left eye  H35.62     2. Diabetes mellitus type 2 without retinopathy ([place])  E11.9     3. Retinal edema  H35.81 OCT, Retina - OU - Both Eyes    4. Essential hypertension  I10     5. Hypertensive retinopathy of both eyes  H35.033     6. Combined forms of age-related cataract of both eyes  H25.813       1. Retinal hemmorhage OS -- improving  - mild focal flame heme inferior to fovea -- resolving - likley related to HTN retinopathy  - no NV  - no ophthalmic intervention indicated or recommended at this time  - recommend  observation - F/u in 3 months w/DFE/OCT  2,3. Diabetes mellitus, type 2 without retinopathy  - A1c: 13.5 on 08.25.22 - FA (7.26.22) shows no MA no NV OU - f/u in 1 year, sooner prn   4,5. Hypertensive retinopathy OU - discussed importance of tight BP control - monitor   6. Mixed Cataract OU - The symptoms of cataract, surgical options, and treatments and risks were discussed with patient. - discussed diagnosis and progression - under the expert management of [place] - monitor   Ophthalmic Meds Ordered this visit:  No orders of the defined types were placed in this encounter.     Return in about 3 months (around 09/30/2021) for f/u retinal heme OS, DFE, OCT.  There are no Patient Instructions on file for this visit.  Explained the diagnoses, plan, and follow up with the patient and they expressed understanding.  Patient expressed understanding of the importance of proper follow up care.   This document serves as a record of services personally performed by Gardiner Sleeper, MD, PhD. It was created on their behalf by Estill Bakes, COT an ophthalmic technician. The creation of this record is the provider's dictation and/or activities during the visit.    Electronically signed by: Estill Bakes, COT 9.6.22 @ 10:14 PM   This document serves as a record of services personally performed by Gardiner Sleeper, MD, PhD. It was created on their behalf by San Jetty. Owens Shark, OA an ophthalmic technician. The creation of this record is the provider's dictation and/or activities during the visit.    Electronically signed by: San Jetty. Owens Shark, [place] 09.06.2022 10:14 PM   Gardiner Sleeper, M.D., Ph.D. Diseases & Surgery of the Retina and Vitreous [facility]  I have reviewed the above documentation for accuracy and completeness, and I agree with the above. Gardiner Sleeper, M.D., Ph.D. 07/01/21 10:15 PM  Abbreviations: M myopia (nearsighted); A astigmatism; H hyperopia  (farsighted); P presbyopia; Mrx spectacle prescription;  CTL contact lenses; OD right eye; OS left eye; OU both eyes  XT exotropia; ET esotropia; PEK punctate epithelial keratitis; PEE punctate epithelial erosions; DES dry eye syndrome; MGD meibomian gland dysfunction; ATs artificial tears; PFAT's preservative free artificial tears; [place] nuclear sclerotic cataract; PSC posterior subcapsular cataract;  ERM epi-retinal membrane; PVD posterior vitreous detachment; RD retinal detachment; DM diabetes mellitus; DR diabetic retinopathy; NPDR non-proliferative diabetic retinopathy; PDR proliferative diabetic retinopathy; CSME clinically significant macular edema; DME diabetic macular edema; dbh dot blot hemorrhages; CWS cotton wool spot; POAG primary open angle glaucoma; C/D cup-to-disc ratio; HVF humphrey visual field; GVF goldmann visual field; OCT optical coherence tomography; IOP intraocular pressure; BRVO Branch retinal vein occlusion; CRVO central retinal vein occlusion; CRAO central retinal artery occlusion; BRAO branch retinal artery occlusion; RT retinal tear; SB scleral buckle; PPV pars plana vitrectomy; VH Vitreous hemorrhage; PRP panretinal laser photocoagulation; IVK intravitreal kenalog; VMT vitreomacular traction; MH Macular hole;  NVD neovascularization of the disc; NVE neovascularization elsewhere; AREDS age related eye disease study; ARMD age related macular degeneration; POAG primary open angle glaucoma; EBMD epithelial/anterior basement membrane dystrophy; ACIOL anterior chamber intraocular lens; IOL intraocular lens; PCIOL posterior chamber intraocular lens; Phaco/IOL phacoemulsification with intraocular lens placement; [place] photorefractive keratectomy; LASIK laser assisted in situ keratomileusis; HTN hypertension; DM diabetes mellitus; COPD chronic obstructive pulmonary disease

## 2021-07-03 DIAGNOSIS — G4733 Obstructive sleep apnea (adult) (pediatric): Secondary | ICD-10-CM | POA: Diagnosis not present

## 2021-07-17 IMAGING — CR CHEST - 2 VIEW
2 series · 2 of 2 positions shown · non-contrast
Comparison: 10/15/2016

CLINICAL DATA: preoperative

EXAM:
CHEST - 2 VIEW

[w chest pa]
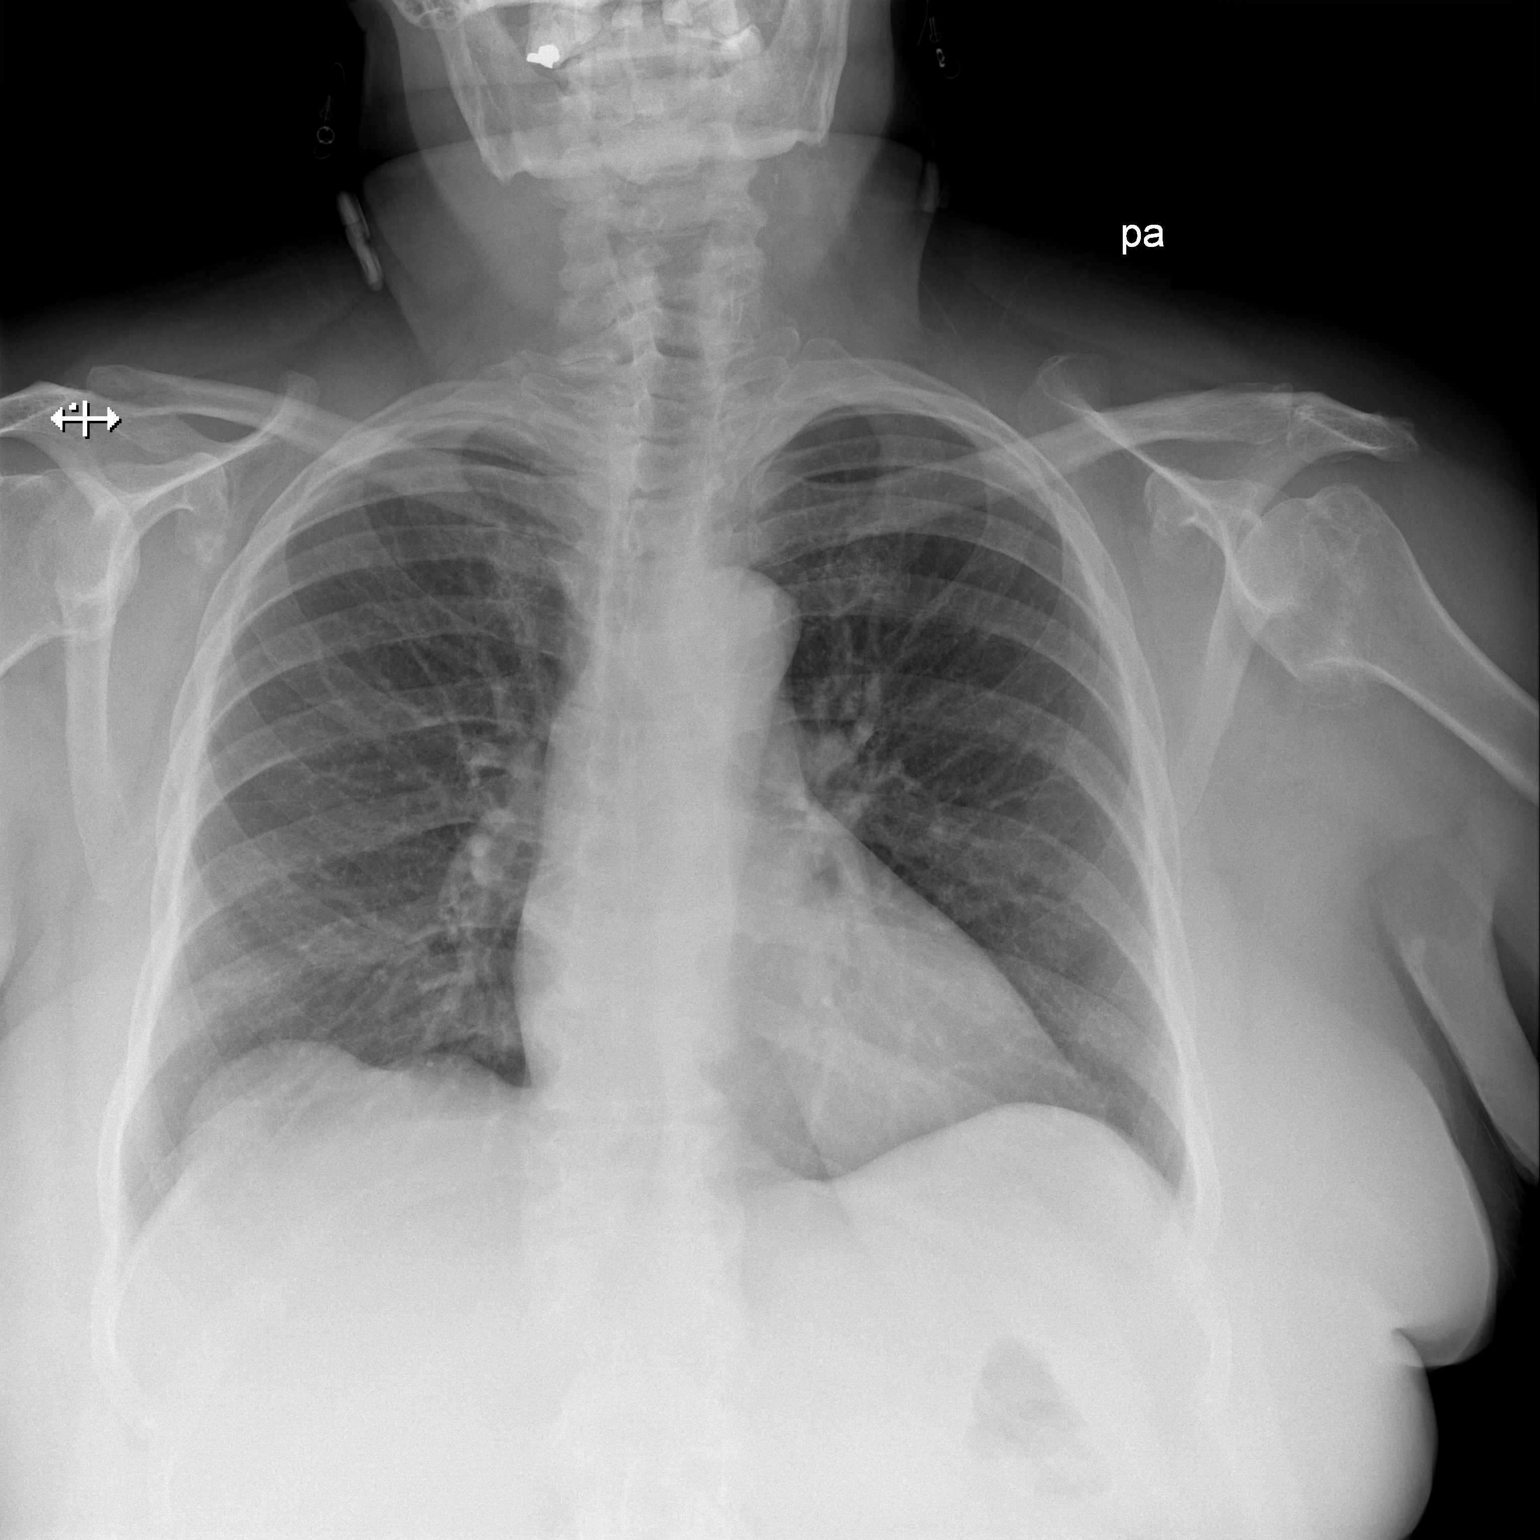

[w chest lat]
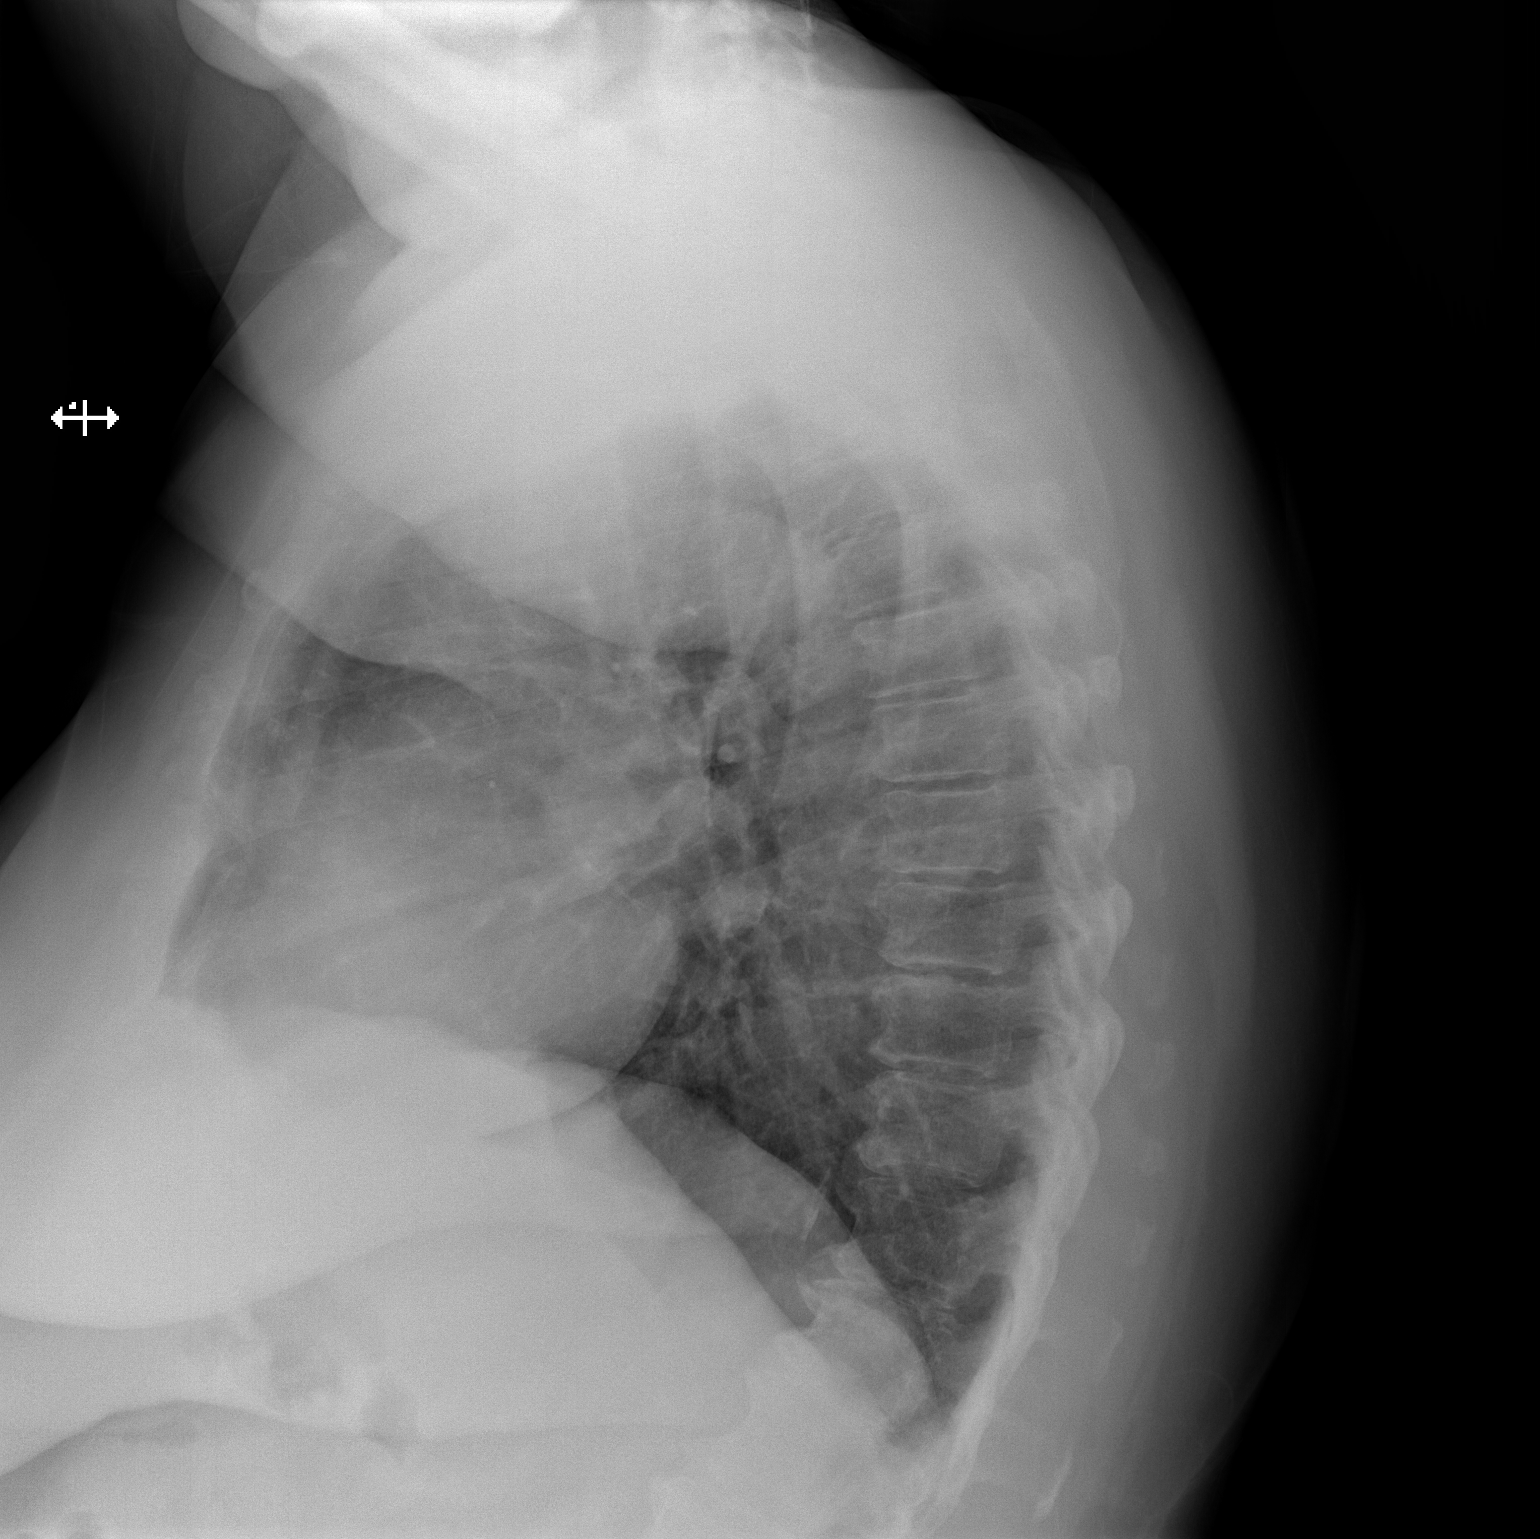

[2 of 2 positions shown; findings below may reference images not displayed]

FINDINGS: The heart size and mediastinal contours are within normal limits.
Both lungs are clear. Small focal eventration of the right
hemidiaphragm. Disc degenerative disease of the thoracic spine.
IMPRESSION: No acute abnormality of the lungs.

## 2021-08-05 ENCOUNTER — Ambulatory Visit: Payer: PPO | Admitting: Orthopaedic Surgery

## 2021-08-06 ENCOUNTER — Ambulatory Visit (INDEPENDENT_AMBULATORY_CARE_PROVIDER_SITE_OTHER): Payer: PPO | Admitting: Orthopaedic Surgery

## 2021-08-06 ENCOUNTER — Ambulatory Visit: Payer: Self-pay

## 2021-08-06 ENCOUNTER — Other Ambulatory Visit: Payer: Self-pay

## 2021-08-06 ENCOUNTER — Encounter: Payer: Self-pay | Admitting: Orthopaedic Surgery

## 2021-08-06 VITALS — BP 145/85 | HR 69 | Ht 61.0 in | Wt 210.2 lb

## 2021-08-06 DIAGNOSIS — M25512 Pain in left shoulder: Secondary | ICD-10-CM

## 2021-08-06 DIAGNOSIS — G8929 Other chronic pain: Secondary | ICD-10-CM | POA: Diagnosis not present

## 2021-08-07 DIAGNOSIS — M25512 Pain in left shoulder: Secondary | ICD-10-CM | POA: Diagnosis not present

## 2021-08-07 DIAGNOSIS — G8929 Other chronic pain: Secondary | ICD-10-CM

## 2021-08-07 MED ORDER — BUPIVACAINE HCL 0.5 % IJ SOLN
3.0000 mL | INTRAMUSCULAR | Status: AC | PRN
  Administered 2021-08-07: 3 mL via INTRA_ARTICULAR

## 2021-08-07 MED ORDER — METHYLPREDNISOLONE ACETATE 40 MG/ML IJ SUSP
40.0000 mg | INTRAMUSCULAR | Status: AC | PRN
Start: 2021-08-07 — End: 2021-08-07
  Administered 2021-08-07: 40 mg via INTRA_ARTICULAR

## 2021-08-07 MED ORDER — LIDOCAINE HCL 1 % IJ SOLN
0.5000 mL | INTRAMUSCULAR | Status: AC | PRN
  Administered 2021-08-07: .5 mL
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
## 2021-09-26 NOTE — Progress Notes (Shared)
[facility] Note  09/30/2021     CHIEF COMPLAINT Patient presents for No chief complaint on file.  HISTORY OF PRESENT ILLNESS: Karina Davis is a 75 y.o. female who presents to the clinic today for:    Referring physician: Shirleen Schirmer, [address], [place],  [place] [id number]  HISTORICAL INFORMATION:   Selected notes from the MEDICAL RECORD NUMBER Referred by Shirleen Schirmer, PA-C for eval of OS clump narrow neovascular blood vessels   CURRENT MEDICATIONS: No current outpatient medications on file. (Ophthalmic Drugs)   No current facility-administered medications for this visit. (Ophthalmic Drugs)   Current Outpatient Medications (Other)  Medication Sig   aspirin 81 MG tablet Take 81 mg by mouth at bedtime.    atorvastatin (LIPITOR) 40 MG tablet Take 1 tablet (40 mg total) by mouth daily. (Patient taking differently: Take 20 mg by mouth every evening.)   azelastine (ASTELIN) 0.1 % nasal spray Place 2 sprays into both nostrils 2 (two) times daily. Use in each nostril as directed (Patient taking differently: Place 2 sprays into both nostrils 2 (two) times daily as needed for rhinitis. Use in each nostril as directed)   b complex vitamins tablet Take 1 tablet daily by mouth.   calcium carbonate (TUMS - DOSED IN MG ELEMENTAL CALCIUM) 500 MG chewable tablet Chew 1-2 tablets by mouth daily as needed for indigestion or heartburn.   CALCIUM CITRATE-VITAMIN D PO Take 1 tablet by mouth 2 (two) times a day.    chlorhexidine (PERIDEX) 0.12 % solution USE AS DIRECTED, USE 15 MLS IN THE MOUTH OR THROAT 2 TIMES A DAY (Patient taking differently: Use as directed 15 mLs in the mouth or throat 2 (two) times daily as needed (oral soreness).)   COD LIVER OIL W/VIT A & D PO Take 1 capsule by mouth at bedtime.   Coenzyme Q10 (COQ-10) 100 MG CAPS Take 100 mg by mouth at bedtime.   cyclobenzaprine (FLEXERIL) 10 MG tablet Take 1  tablet (10 mg total) by mouth at bedtime as needed for muscle spasms.   gabapentin (NEURONTIN) 600 MG tablet Take 1 tablet (600 mg total) by mouth at bedtime. (Patient taking differently: Take 1,200 mg by mouth at bedtime.)   hydrochlorothiazide (HYDRODIURIL) 25 MG tablet Take 1 tablet (25 mg total) by mouth daily with breakfast.   linagliptin (TRADJENTA) 5 MG TABS tablet Take 1 tablet (5 mg total) by mouth daily. (Patient taking differently: Take 5 mg by mouth at bedtime.)   losartan (COZAAR) 100 MG tablet Take 1 tablet (100 mg total) by mouth daily. (Patient taking differently: Take 100 mg by mouth at bedtime.)   Magnesium 250 MG TABS Take 250 mg by mouth at bedtime.   meloxicam (MOBIC) 15 MG tablet Take 1 tablet (15 mg total) by mouth daily.   metFORMIN (GLUCOPHAGE) 500 MG tablet Take 2 tablets (1,000 mg total) by mouth 2 (two) times daily with a meal.   MILK THISTLE PO Take 1-2 tablets by mouth See admin instructions. Take 1 tablet in the morning and 2 tablets at night   Multiple Vitamins-Minerals (CENTRUM SILVER PO) Take 1 tablet by mouth at bedtime.   naproxen sodium (ALEVE) 220 MG tablet Take 440 mg by mouth 2 (two) times daily as needed (pain).   omega-3 acid ethyl esters (LOVAZA) 1 g capsule Take 1 capsule (1 g total) by mouth 2 (two) times daily.   Potassium 99 MG TABS  Take 99 mg by mouth 2 (two) times a day.   TURMERIC PO Take 1,500 mg by mouth 2 (two) times a day.   vitamin C (ASCORBIC ACID) 500 MG tablet Take 500 mg by mouth 2 (two) times a day.   No current facility-administered medications for this visit. (Other)      REVIEW OF SYSTEMS:      ALLERGIES Allergies  Allergen Reactions   Cymbalta [Duloxetine Hcl] Other (See Comments)    Nervous   Gabapentin Other (See Comments)    Gait instability, dizziness; tolerates QHS dosing   Sulfa Antibiotics Other (See Comments)    Unknown reaction    PAST MEDICAL HISTORY Past Medical History:  Diagnosis Date   Ankle  fracture    Diabetes mellitus without complication (HCC)    Diabetic neuropathy ([place]) 03/30/2017   GERD (gastroesophageal reflux disease)    Hypertension    Neuropathy    Obesity    Sleep apnea    cpap   Past Surgical History:  Procedure Laterality Date   APPENDECTOMY  1955   childhood   COLON SURGERY     KNEE ARTHROSCOPY WITH LATERAL MENISECTOMY Left 05/15/2019   Procedure: LEFT KNEE ARTHOSCOPY PARTIAL MENISCECTOMY, REMOVAL OF LOOSE BODY;  Surgeon: Marybelle Killings, MD;  Location: [facility];  Service: Orthopedics;  Laterality: Left;    FAMILY HISTORY Family History  Problem Relation Age of Onset   Cancer Mother 72       Colon   Colon cancer Mother 30   Stroke Father 93   Diabetes Father    Heart disease Father    Hypertension Father    Hypertension Sister    Diabetes Sister    Kidney disease Sister        due to HTN and DM   Hypertension Brother    Stroke Brother    Hypertension Daughter    Diabetes Daughter    Obesity Daughter        gastric sleeve surgery 03/05/2014   Hypertension Sister    Drug abuse Sister        narcotic abuse   Stroke Sister        associated with narcotic abuse   Diabetes Sister    Hypertension Sister    Hyperlipidemia Sister    Hypertension Brother    Drug abuse Son        cocaine   Stroke Brother    Hypertension Brother    Esophageal cancer Neg Hx    Rectal cancer Neg Hx    Stomach cancer Neg Hx     SOCIAL HISTORY Social History   Tobacco Use   Smoking status: Never   Smokeless tobacco: Never  Vaping Use   Vaping Use: Never used  Substance Use Topics   Alcohol use: Yes    Comment: Socially   Drug use: No         OPHTHALMIC EXAM:  Not recorded     IMAGING AND PROCEDURES  Imaging and Procedures for 09/30/2021             ASSESSMENT/PLAN:  No diagnosis found.  1. Retinal hemmorhage OS -- improving  - mild focal flame heme inferior to fovea -- resolving - likley related to HTN retinopathy  - no NV  - no  ophthalmic intervention indicated or recommended at this time  - recommend observation - F/u in 3 months w/DFE/OCT  2,3. Diabetes mellitus, type 2 without retinopathy  - A1c: 13.5 on 08.25.22 - FA (7.26.22)  shows no MA no NV OU - f/u in 1 year, sooner prn   4,5. Hypertensive retinopathy OU - discussed importance of tight BP control - monitor   6. Mixed Cataract OU - The symptoms of cataract, surgical options, and treatments and risks were discussed with patient. - discussed diagnosis and progression - under the expert management of [place] - monitor   Ophthalmic Meds Ordered this visit:  No orders of the defined types were placed in this encounter.     No follow-ups on file.  There are no Patient Instructions on file for this visit.  Explained the diagnoses, plan, and follow up with the patient and they expressed understanding.  Patient expressed understanding of the importance of proper follow up care.   This document serves as a record of services personally performed by Gardiner Sleeper, MD, PhD. It was created on their behalf by Estill Bakes, COT an ophthalmic technician. The creation of this record is the provider's dictation and/or activities during the visit.    Electronically signed by: Estill Bakes, COT 12.2.22 @ 2:58 PM   Gardiner Sleeper, M.D., Ph.D. Diseases & Surgery of the Retina and Vitreous [facility]: M myopia (nearsighted); A astigmatism; H hyperopia (farsighted); P presbyopia; Mrx spectacle prescription;  CTL contact lenses; OD right eye; OS left eye; OU both eyes  XT exotropia; ET esotropia; PEK punctate epithelial keratitis; PEE punctate epithelial erosions; DES dry eye syndrome; MGD meibomian gland dysfunction; ATs artificial tears; PFAT's preservative free artificial tears; [place] nuclear sclerotic cataract; PSC posterior subcapsular cataract; ERM epi-retinal membrane; PVD posterior vitreous detachment; RD  retinal detachment; DM diabetes mellitus; DR diabetic retinopathy; NPDR non-proliferative diabetic retinopathy; PDR proliferative diabetic retinopathy; CSME clinically significant macular edema; DME diabetic macular edema; dbh dot blot hemorrhages; CWS cotton wool spot; POAG primary open angle glaucoma; C/D cup-to-disc ratio; HVF humphrey visual field; GVF goldmann visual field; OCT optical coherence tomography; IOP intraocular pressure; BRVO Branch retinal vein occlusion; CRVO central retinal vein occlusion; CRAO central retinal artery occlusion; BRAO branch retinal artery occlusion; RT retinal tear; SB scleral buckle; PPV pars plana vitrectomy; VH Vitreous hemorrhage; PRP panretinal laser photocoagulation; IVK intravitreal kenalog; VMT vitreomacular traction; MH Macular hole;  NVD neovascularization of the disc; NVE neovascularization elsewhere; AREDS age related eye disease study; ARMD age related macular degeneration; POAG primary open angle glaucoma; EBMD epithelial/anterior basement membrane dystrophy; ACIOL anterior chamber intraocular lens; IOL intraocular lens; PCIOL posterior chamber intraocular lens; Phaco/IOL phacoemulsification with intraocular lens placement; [place] photorefractive keratectomy; LASIK laser assisted in situ keratomileusis; HTN hypertension; DM diabetes mellitus; COPD chronic obstructive pulmonary disease

## 2021-09-30 ENCOUNTER — Encounter (INDEPENDENT_AMBULATORY_CARE_PROVIDER_SITE_OTHER): Payer: PPO | Admitting: Ophthalmology

## 2021-09-30 DIAGNOSIS — H3562 Retinal hemorrhage, left eye: Secondary | ICD-10-CM

## 2021-09-30 DIAGNOSIS — H3581 Retinal edema: Secondary | ICD-10-CM

## 2021-09-30 DIAGNOSIS — H25813 Combined forms of age-related cataract, bilateral: Secondary | ICD-10-CM

## 2021-09-30 DIAGNOSIS — H35033 Hypertensive retinopathy, bilateral: Secondary | ICD-10-CM

## 2021-09-30 DIAGNOSIS — E119 Type 2 diabetes mellitus without complications: Secondary | ICD-10-CM

## 2021-09-30 DIAGNOSIS — I10 Essential (primary) hypertension: Secondary | ICD-10-CM

## 2021-10-06 ENCOUNTER — Other Ambulatory Visit (HOSPITAL_COMMUNITY): Payer: Self-pay

## 2021-10-15 DIAGNOSIS — E1142 Type 2 diabetes mellitus with diabetic polyneuropathy: Secondary | ICD-10-CM | POA: Diagnosis not present

## 2021-10-15 DIAGNOSIS — G4733 Obstructive sleep apnea (adult) (pediatric): Secondary | ICD-10-CM | POA: Diagnosis not present

## 2021-10-15 DIAGNOSIS — E113291 Type 2 diabetes mellitus with mild nonproliferative diabetic retinopathy without macular edema, right eye: Secondary | ICD-10-CM | POA: Diagnosis not present

## 2021-10-15 DIAGNOSIS — E1140 Type 2 diabetes mellitus with diabetic neuropathy, unspecified: Secondary | ICD-10-CM | POA: Diagnosis not present

## 2021-10-15 DIAGNOSIS — E1165 Type 2 diabetes mellitus with hyperglycemia: Secondary | ICD-10-CM | POA: Diagnosis not present

## 2021-10-15 DIAGNOSIS — E1159 Type 2 diabetes mellitus with other circulatory complications: Secondary | ICD-10-CM | POA: Diagnosis not present

## 2021-10-15 DIAGNOSIS — I152 Hypertension secondary to endocrine disorders: Secondary | ICD-10-CM | POA: Diagnosis not present

## 2021-10-15 DIAGNOSIS — E1169 Type 2 diabetes mellitus with other specified complication: Secondary | ICD-10-CM | POA: Diagnosis not present

## 2021-10-15 DIAGNOSIS — E785 Hyperlipidemia, unspecified: Secondary | ICD-10-CM | POA: Diagnosis not present

## 2021-10-15 DIAGNOSIS — Z2820 Immunization not carried out because of patient decision for unspecified reason: Secondary | ICD-10-CM | POA: Diagnosis not present

## 2021-11-17 NOTE — Progress Notes (Signed)
[facility] Note  11/18/2021     CHIEF COMPLAINT Patient presents for Retina Follow Up    HISTORY OF PRESENT ILLNESS: Karina Davis is a 76 y.o. female who presents to the clinic today for:  HPI     Retina Follow Up   Patient presents with  Other.  In left eye.  This started 3 months ago.  I, the attending physician,  performed the HPI with the patient and updated documentation appropriately.        Comments   Patient here for 3 months retina follow up for retinal heme OS. Patient states vision not as good. Usually sees with readers. Now needs glasses. Has cataracts. No eye pain. In the am has a soreness. Sometimes it clears up. Added Invokana med.      Last edited by Bernarda Caffey, MD on 11/19/2021  1:02 AM.     Pt states her drs added a new diabetic medication which seems to be helping her A1c (it was 12.7 on 12.21.22)  Referring physician: Shirleen Schirmer, PA-C  [address], [place] [place],  [place] [phone]  HISTORICAL INFORMATION:   Selected notes from the MEDICAL RECORD NUMBER Referred by Shirleen Schirmer, PA-C for eval of OS clump narrow neovascular blood vessels   CURRENT MEDICATIONS: No current outpatient medications on file. (Ophthalmic Drugs)   No current facility-administered medications for this visit. (Ophthalmic Drugs)   Current Outpatient Medications (Other)  Medication Sig   aspirin 81 MG tablet Take 81 mg by mouth at bedtime.    atorvastatin (LIPITOR) 40 MG tablet Take 1 tablet (40 mg total) by mouth daily. (Patient taking differently: Take 20 mg by mouth every evening.)   azelastine (ASTELIN) 0.1 % nasal spray Place 2 sprays into both nostrils 2 (two) times daily. Use in each nostril as directed (Patient taking differently: Place 2 sprays into both nostrils 2 (two) times daily as needed for rhinitis. Use in each nostril as directed)   b complex vitamins tablet Take 1 tablet daily by mouth.    calcium carbonate (TUMS - DOSED IN MG ELEMENTAL CALCIUM) 500 MG chewable tablet Chew 1-2 tablets by mouth daily as needed for indigestion or heartburn.   CALCIUM CITRATE-VITAMIN D PO Take 1 tablet by mouth 2 (two) times a day.    chlorhexidine (PERIDEX) 0.12 % solution USE AS DIRECTED, USE 15 MLS IN THE MOUTH OR THROAT 2 TIMES A DAY (Patient taking differently: Use as directed 15 mLs in the mouth or throat 2 (two) times daily as needed (oral soreness).)   COD LIVER OIL W/VIT A & D PO Take 1 capsule by mouth at bedtime.   Coenzyme Q10 (COQ-10) 100 MG CAPS Take 100 mg by mouth at bedtime.   cyclobenzaprine (FLEXERIL) 10 MG tablet Take 1 tablet (10 mg total) by mouth at bedtime as needed for muscle spasms.   gabapentin (NEURONTIN) 600 MG tablet Take 1 tablet (600 mg total) by mouth at bedtime. (Patient taking differently: Take 1,200 mg by mouth at bedtime.)   hydrochlorothiazide (HYDRODIURIL) 25 MG tablet Take 1 tablet (25 mg total) by mouth daily with breakfast.   linagliptin (TRADJENTA) 5 MG TABS tablet Take 1 tablet (5 mg total) by mouth daily. (Patient taking differently: Take 5 mg by mouth at bedtime.)   losartan (COZAAR) 100 MG tablet Take 1 tablet (100 mg total) by mouth daily. (Patient taking differently: Take 100 mg by mouth at bedtime.)   Magnesium  250 MG TABS Take 250 mg by mouth at bedtime.   meloxicam (MOBIC) 15 MG tablet Take 1 tablet (15 mg total) by mouth daily.   metFORMIN (GLUCOPHAGE) 500 MG tablet Take 2 tablets (1,000 mg total) by mouth 2 (two) times daily with a meal.   MILK THISTLE PO Take 1-2 tablets by mouth See admin instructions. Take 1 tablet in the morning and 2 tablets at night   Multiple Vitamins-Minerals (CENTRUM SILVER PO) Take 1 tablet by mouth at bedtime.   naproxen sodium (ALEVE) 220 MG tablet Take 440 mg by mouth 2 (two) times daily as needed (pain).   omega-3 acid ethyl esters (LOVAZA) 1 g capsule Take 1 capsule (1 g total) by mouth 2 (two) times daily.   Potassium  99 MG TABS Take 99 mg by mouth 2 (two) times a day.   TURMERIC PO Take 1,500 mg by mouth 2 (two) times a day.   vitamin C (ASCORBIC ACID) 500 MG tablet Take 500 mg by mouth 2 (two) times a day.   No current facility-administered medications for this visit. (Other)   REVIEW OF SYSTEMS: ROS   Positive for: Gastrointestinal, Endocrine, Eyes, Respiratory Negative for: Constitutional, Neurological, Skin, Genitourinary, Musculoskeletal, HENT, Cardiovascular, Psychiatric, Allergic/Imm, Heme/Lymph Last edited by Theodore Demark, COA on 11/18/2021  3:12 PM.     ALLERGIES Allergies  Allergen Reactions   Cymbalta [Duloxetine Hcl] Other (See Comments)    Nervous   Gabapentin Other (See Comments)    Gait instability, dizziness; tolerates QHS dosing   Sulfa Antibiotics Other (See Comments)    Unknown reaction   PAST MEDICAL HISTORY Past Medical History:  Diagnosis Date   Ankle fracture    Diabetes mellitus without complication ([place])    Diabetic neuropathy ([place]) 03/30/2017   GERD (gastroesophageal reflux disease)    Hypertension    Neuropathy    Obesity    Sleep apnea    cpap   Past Surgical History:  Procedure Laterality Date   APPENDECTOMY  1955   childhood   COLON SURGERY     KNEE ARTHROSCOPY WITH LATERAL MENISECTOMY Left 05/15/2019   Procedure: LEFT KNEE ARTHOSCOPY PARTIAL MENISCECTOMY, REMOVAL OF LOOSE BODY;  Surgeon: Marybelle Killings, MD;  Location: [facility];  Service: Orthopedics;  Laterality: Left;   FAMILY HISTORY Family History  Problem Relation Age of Onset   Cancer Mother 20       Colon   Colon cancer Mother 8   Stroke Father 88   Diabetes Father    Heart disease Father    Hypertension Father    Hypertension Sister    Diabetes Sister    Kidney disease Sister        due to HTN and DM   Hypertension Brother    Stroke Brother    Hypertension Daughter    Diabetes Daughter    Obesity Daughter        gastric sleeve surgery 03/05/2014   Hypertension Sister    Drug abuse  Sister        narcotic abuse   Stroke Sister        associated with narcotic abuse   Diabetes Sister    Hypertension Sister    Hyperlipidemia Sister    Hypertension Brother    Drug abuse Son        cocaine   Stroke Brother    Hypertension Brother    Esophageal cancer Neg Hx    Rectal cancer Neg Hx    Stomach cancer  Neg Hx    SOCIAL HISTORY Social History   Tobacco Use   Smoking status: Never   Smokeless tobacco: Never  Vaping Use   Vaping Use: Never used  Substance Use Topics   Alcohol use: Yes    Comment: Socially   Drug use: No       OPHTHALMIC EXAM:  Base Eye Exam     Visual Acuity (Snellen - Linear)       Right Left   Dist [place] 20/20 20/25 -1         Tonometry (Tonopen, 3:08 PM)       Right Left   Pressure 12 13         Pupils       Dark Light Shape React APD   Right 4 3 Round Brisk None   Left 4 3 Round Brisk None         Visual Fields (Counting fingers)       Left Right    Full Full         Extraocular Movement       Right Left    Full, Ortho Full, Ortho         Neuro/Psych     Oriented x3: Yes   Mood/Affect: Normal         Dilation     Both eyes: 1.0% Mydriacyl, 2.5% Phenylephrine @ 3:08 PM           Slit Lamp and Fundus Exam     Slit Lamp Exam       Right Left   Lids/Lashes Dermato Dermato   Conjunctiva/Sclera Nasal and temporal mild pinguecula Nasal and temporal pinguecula - mild   Cornea Trace tear film debris Trace tear film debris, trace PEE   Anterior Chamber Deep and quiet Deep and quiet   Iris Round and dilated, no NVI Round and dilated, no NVI   Lens 2-3 NS, 2-3 cortical 2-3 NS, 2-3 cortical, Trace PSC   Anterior Vitreous Synerisis Synerisis, PVD, mild condensations         Fundus Exam       Right Left   Disc Pink, sharp, compact Pink, sharp, mild PPA   C/D Ratio 0.1 0.2   Macula flat, blunted foveal reflex, mild RPE mottling, no heme or edema Flat, blunted foveal reflex, focal flame heme  inferior to fovea - resolved, mild RPE motling, no heme or edema   Vessels Attenuated, tortuous, mild AV crossing changes Attenuated, tortuous, mild AV crossing changes   Periphery Attached, no heme Attached, no heme           IMAGING AND PROCEDURES  Imaging and Procedures for 11/18/2021  OCT, Retina - OU - Both Eyes       Right Eye Quality was good. Central Foveal Thickness: 288. Progression has been stable. Findings include normal foveal contour, no IRF, no SRF.   Left Eye Quality was good. Central Foveal Thickness: 283. Progression has been stable. Findings include normal foveal contour, no IRF, no SRF.   Notes *Images captured and stored on drive  Diagnosis / Impression:  OU: NFP, No IRF/SRF  Clinical management:  See below  Abbreviations: NFP - Normal foveal profile. CME - cystoid macular edema. PED - pigment epithelial detachment. IRF - intraretinal fluid. SRF - subretinal fluid. EZ - ellipsoid zone. ERM - epiretinal membrane. ORA - outer retinal atrophy. ORT - outer retinal tubulation. SRHM - subretinal hyper-reflective material. IRHM - intraretinal hyper-reflective material  ASSESSMENT/PLAN:    ICD-10-CM   1. Diabetes mellitus type 2 without retinopathy ([place])  E11.9 OCT, Retina - OU - Both Eyes    2. Retinal hemorrhage of left eye  H35.62 OCT, Retina - OU - Both Eyes    3. Essential hypertension  I10     4. Hypertensive retinopathy of both eyes  H35.033     5. Combined forms of age-related cataract of both eyes  H25.813      1. Diabetes mellitus, type 2 without retinopathy  - A1c: 12.7 on 12.21.22, 13.5 on 08.25.22 - FA (7.26.22) shows no MA no NV OU - The incidence, risk factors for progression, natural history and treatment options for diabetic retinopathy  were discussed with patient.   - The need for close monitoring of blood glucose, blood pressure, and serum lipids, avoiding cigarette or any type of tobacco, and the need for long term  follow up was also discussed with patient. - f/u in 1 year, sooner prn  2. Retinal hemmorhage OS -- resolved today  - mild focal flame heme inferior to fovea -- resolved - likley related to HTN retinopathy  - no ophthalmic intervention indicated or recommended at this time   3,4. Hypertensive retinopathy OU - discussed importance of tight BP control - monitor   5. Mixed Cataract OU - The symptoms of cataract, surgical options, and treatments and risks were discussed with patient. - discussed diagnosis and progression - under the expert management of [place] - monitor   Ophthalmic Meds Ordered this visit:  No orders of the defined types were placed in this encounter.    Return in about 1 year (around 11/18/2022) for f/u DM exam, DFE, OCT.  There are no Patient Instructions on file for this visit.  Explained the diagnoses, plan, and follow up with the patient and they expressed understanding.  Patient expressed understanding of the importance of proper follow up care.   This document serves as a record of services personally performed by Gardiner Sleeper, MD, PhD. It was created on their behalf by Leonie Douglas, an ophthalmic technician. The creation of this record is the provider's dictation and/or activities during the visit.    Electronically signed by: Leonie Douglas COA, 11/19/21  1:04 AM  Gardiner Sleeper, M.D., Ph.D. Diseases & Surgery of the Retina and [place] 11/18/2021  I have reviewed the above documentation for accuracy and completeness, and I agree with the above. Gardiner Sleeper, M.D., Ph.D. 11/19/21 1:07 AM   Abbreviations: M myopia (nearsighted); A astigmatism; H hyperopia (farsighted); P presbyopia; Mrx spectacle prescription;  CTL contact lenses; OD right eye; OS left eye; OU both eyes  XT exotropia; ET esotropia; PEK punctate epithelial keratitis; PEE punctate epithelial erosions; DES dry eye syndrome; MGD meibomian gland  dysfunction; ATs artificial tears; PFAT's preservative free artificial tears; [place] nuclear sclerotic cataract; PSC posterior subcapsular cataract; ERM epi-retinal membrane; PVD posterior vitreous detachment; RD retinal detachment; DM diabetes mellitus; DR diabetic retinopathy; NPDR non-proliferative diabetic retinopathy; PDR proliferative diabetic retinopathy; CSME clinically significant macular edema; DME diabetic macular edema; dbh dot blot hemorrhages; CWS cotton wool spot; POAG primary open angle glaucoma; C/D cup-to-disc ratio; HVF humphrey visual field; GVF goldmann visual field; OCT optical coherence tomography; IOP intraocular pressure; BRVO Branch retinal vein occlusion; CRVO central retinal vein occlusion; CRAO central retinal artery occlusion; BRAO branch retinal artery occlusion; RT retinal tear; SB scleral buckle; PPV pars plana vitrectomy; VH Vitreous hemorrhage; PRP  panretinal laser photocoagulation; IVK intravitreal kenalog; VMT vitreomacular traction; MH Macular hole;  NVD neovascularization of the disc; NVE neovascularization elsewhere; AREDS age related eye disease study; ARMD age related macular degeneration; POAG primary open angle glaucoma; EBMD epithelial/anterior basement membrane dystrophy; ACIOL anterior chamber intraocular lens; IOL intraocular lens; PCIOL posterior chamber intraocular lens; Phaco/IOL phacoemulsification with intraocular lens placement; [place] photorefractive keratectomy; LASIK laser assisted in situ keratomileusis; HTN hypertension; DM diabetes mellitus; COPD chronic obstructive pulmonary disease

## 2021-11-18 ENCOUNTER — Ambulatory Visit (INDEPENDENT_AMBULATORY_CARE_PROVIDER_SITE_OTHER): Payer: PPO | Admitting: Ophthalmology

## 2021-11-18 ENCOUNTER — Other Ambulatory Visit: Payer: Self-pay

## 2021-11-18 ENCOUNTER — Encounter (INDEPENDENT_AMBULATORY_CARE_PROVIDER_SITE_OTHER): Payer: Self-pay | Admitting: Ophthalmology

## 2021-11-18 DIAGNOSIS — I10 Essential (primary) hypertension: Secondary | ICD-10-CM

## 2021-11-18 DIAGNOSIS — H35033 Hypertensive retinopathy, bilateral: Secondary | ICD-10-CM

## 2021-11-18 DIAGNOSIS — H25813 Combined forms of age-related cataract, bilateral: Secondary | ICD-10-CM

## 2021-11-18 DIAGNOSIS — H3562 Retinal hemorrhage, left eye: Secondary | ICD-10-CM

## 2021-11-18 DIAGNOSIS — E119 Type 2 diabetes mellitus without complications: Secondary | ICD-10-CM

## 2021-11-19 ENCOUNTER — Encounter (INDEPENDENT_AMBULATORY_CARE_PROVIDER_SITE_OTHER): Payer: Self-pay | Admitting: Ophthalmology

## 2022-04-23 ENCOUNTER — Encounter: Payer: Self-pay | Admitting: Gastroenterology

## 2022-07-07 ENCOUNTER — Ambulatory Visit (AMBULATORY_SURGERY_CENTER): Payer: Self-pay | Admitting: *Deleted

## 2022-07-07 VITALS — Ht 61.0 in | Wt 200.0 lb

## 2022-07-07 DIAGNOSIS — Z800 Family history of malignant neoplasm of digestive organs: Secondary | ICD-10-CM

## 2022-07-07 DIAGNOSIS — Z86010 Personal history of colonic polyps: Secondary | ICD-10-CM

## 2022-07-07 MED ORDER — PLENVU 140 G PO SOLR: 1.0000 | Freq: Once | ORAL | 0 refills | Status: AC

## 2022-07-07 NOTE — Progress Notes (Signed)
No egg or soy allergy known to patient  No issues known to pt with past sedation with any surgeries or procedures Patient denies ever being told they had issues or difficulty with intubation  No FH of Malignant Hyperthermia Pt is not on diet pills Pt is not on  home 02  Pt is not on blood thinners  Pt denies issues with constipation  No A fib or A flutter Have any cardiac testing pending--no Pt instructed to use [email or web address] or GoodRx for a price reduction on prep    Discussed alterative prep of golytely decline @ this time.

## 2022-07-23 ENCOUNTER — Other Ambulatory Visit: Payer: PPO

## 2022-07-23 ENCOUNTER — Ambulatory Visit (INDEPENDENT_AMBULATORY_CARE_PROVIDER_SITE_OTHER): Payer: PPO | Admitting: Podiatry

## 2022-07-23 DIAGNOSIS — M7751 Other enthesopathy of right foot: Secondary | ICD-10-CM | POA: Diagnosis not present

## 2022-07-23 DIAGNOSIS — R29818 Other symptoms and signs involving the nervous system: Secondary | ICD-10-CM | POA: Insufficient documentation

## 2022-07-23 DIAGNOSIS — M4306 Spondylolysis, lumbar region: Secondary | ICD-10-CM | POA: Insufficient documentation

## 2022-07-23 DIAGNOSIS — Z636 Dependent relative needing care at home: Secondary | ICD-10-CM | POA: Insufficient documentation

## 2022-07-23 DIAGNOSIS — Z719 Counseling, unspecified: Secondary | ICD-10-CM | POA: Insufficient documentation

## 2022-07-23 DIAGNOSIS — M533 Sacrococcygeal disorders, not elsewhere classified: Secondary | ICD-10-CM | POA: Insufficient documentation

## 2022-07-23 DIAGNOSIS — E1141 Type 2 diabetes mellitus with diabetic mononeuropathy: Secondary | ICD-10-CM

## 2022-07-23 DIAGNOSIS — Q666 Other congenital valgus deformities of feet: Secondary | ICD-10-CM

## 2022-07-23 DIAGNOSIS — M76821 Posterior tibial tendinitis, right leg: Secondary | ICD-10-CM

## 2022-07-23 DIAGNOSIS — M47816 Spondylosis without myelopathy or radiculopathy, lumbar region: Secondary | ICD-10-CM | POA: Insufficient documentation

## 2022-07-23 MED ORDER — TRIAMCINOLONE ACETONIDE 40 MG/ML IJ SUSP
20.0000 mg | Freq: Once | INTRAMUSCULAR | Status: AC
  Administered 2022-07-23: 20 mg

## 2022-07-25 NOTE — Progress Notes (Signed)
She presents today after having not seen her for more than a year she states that she may need another shot as she refers to the posterior tibial tendinitis of the right side.  States it is becoming a little more weak and tender.  She states that she still has none neuropathy she maintains it with her gabapentin.  She takes it at nighttime.  Objective: Vital signs are stable oriented x3.  Pulses are palpable.  Neurologic sensorium is diminished per Semmes Weinstein monofilament muscle strength appears to be normal in all groups.  She does have tenderness on palpation of the posterior tibial tendon right with some mild fluctuance of the tendon sheath.  But strength appears to be normal.  She has tenderness on palpation of the subtalar joint.  She has tenderness on end range of motion and on palpation of the sinus tarsi.  Assessment: Diabetes mellitus with diabetic peripheral neuropathy digital deformities posterior tibial tendinitis.  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  Osteoarthritis and pes planovalgus  Plan: I injected the area today with dexamethasone and local anesthetic.  She will follow-up with Cyril Mourning today for diabetic shoe molding.

## 2022-08-03 ENCOUNTER — Encounter: Payer: PPO | Admitting: Gastroenterology

## 2022-08-03 ENCOUNTER — Telehealth: Payer: Self-pay | Admitting: Gastroenterology

## 2022-08-03 NOTE — Telephone Encounter (Signed)
Patient called states she is not feeling good and could not make it her appointment today.

## 2022-08-03 NOTE — Telephone Encounter (Signed)
FYI

## 2022-08-13 ENCOUNTER — Encounter: Payer: PPO | Admitting: Gastroenterology

## 2022-09-15 ENCOUNTER — Ambulatory Visit (INDEPENDENT_AMBULATORY_CARE_PROVIDER_SITE_OTHER): Payer: PPO

## 2022-09-15 DIAGNOSIS — E1141 Type 2 diabetes mellitus with diabetic mononeuropathy: Secondary | ICD-10-CM | POA: Diagnosis not present

## 2022-09-15 DIAGNOSIS — Q666 Other congenital valgus deformities of feet: Secondary | ICD-10-CM

## 2022-09-15 NOTE — Progress Notes (Signed)
Patient presents today to pick up diabetic shoes and insoles.  Patient was dispensed 1 pair of diabetic shoes and 3 pairs of foam casted diabetic insoles. Fit was satisfactory. Instructions for break-in and wear was reviewed and a copy was given to the patient.   Re-appointment for regularly scheduled diabetic foot care visits or if they should experience any trouble with the shoes or insoles.  

## 2022-09-21 ENCOUNTER — Encounter: Payer: PPO | Admitting: Gastroenterology

## 2022-09-21 ENCOUNTER — Telehealth: Payer: Self-pay | Admitting: Gastroenterology

## 2022-09-21 NOTE — Telephone Encounter (Signed)
Good Morning Dr. Tarri Glenn,  I called patient and she stated that her brother had a stroke and she forgot about procedure.  She will call back to reschedule appointment.   I will NO SHOW patient.  [employer].

## 2022-11-05 NOTE — Progress Notes (Shared)
[facility] Note  11/17/2022     CHIEF COMPLAINT Patient presents for No chief complaint on file.    HISTORY OF PRESENT ILLNESS: Karina Davis is a 77 y.o. female who presents to the clinic today for:    Pt states her drs added a new diabetic medication which seems to be helping her A1c (it was 12.7 on 12.21.22)  Referring physician: Shirleen Schirmer, PA-C  [address], [address] [place],  [place] [id number]  HISTORICAL INFORMATION:   Selected notes from the MEDICAL RECORD NUMBER Referred by Shirleen Schirmer, PA-C for eval of OS clump narrow neovascular blood vessels   CURRENT MEDICATIONS: No current outpatient medications on file. (Ophthalmic Drugs)   No current facility-administered medications for this visit. (Ophthalmic Drugs)   Current Outpatient Medications (Other)  Medication Sig   aspirin 81 MG tablet Take 81 mg by mouth at bedtime.    atorvastatin (LIPITOR) 40 MG tablet Take 1 tablet (40 mg total) by mouth daily. (Patient taking differently: Take 20 mg by mouth every evening.)   azelastine (ASTELIN) 0.1 % nasal spray Place 2 sprays into both nostrils 2 (two) times daily. Use in each nostril as directed (Patient taking differently: Place 2 sprays into both nostrils 2 (two) times daily as needed for rhinitis. Use in each nostril as directed)   b complex vitamins tablet Take 1 tablet daily by mouth.   calcium carbonate (TUMS - DOSED IN MG ELEMENTAL CALCIUM) 500 MG chewable tablet Chew 1-2 tablets by mouth daily as needed for indigestion or heartburn.   CALCIUM CITRATE-VITAMIN D PO Take 1 tablet by mouth 2 (two) times a day.    canagliflozin (INVOKANA) 100 MG TABS tablet Take by mouth.   chlorhexidine (PERIDEX) 0.12 % solution USE AS DIRECTED, USE 15 MLS IN THE MOUTH OR THROAT 2 TIMES A DAY (Patient not taking: Reported on 07/07/2022)   COD LIVER OIL W/VIT A & D PO Take 1 capsule by mouth at bedtime. (Patient not taking:  Reported on 07/07/2022)   Coenzyme Q10 (COQ-10) 100 MG CAPS Take 100 mg by mouth at bedtime.   cyclobenzaprine (FLEXERIL) 10 MG tablet Take 1 tablet (10 mg total) by mouth at bedtime as needed for muscle spasms. (Patient not taking: Reported on 07/07/2022)   gabapentin (NEURONTIN) 600 MG tablet Take 1 tablet (600 mg total) by mouth at bedtime. (Patient taking differently: Take 1,200 mg by mouth at bedtime.)   GARLIC PO Take by mouth daily.   hydrochlorothiazide (HYDRODIURIL) 25 MG tablet Take 1 tablet (25 mg total) by mouth daily with breakfast.   linagliptin (TRADJENTA) 5 MG TABS tablet Take 1 tablet (5 mg total) by mouth daily. (Patient taking differently: Take 5 mg by mouth at bedtime.)   losartan (COZAAR) 100 MG tablet Take 1 tablet (100 mg total) by mouth daily. (Patient taking differently: Take 100 mg by mouth at bedtime.)   Magnesium 250 MG TABS Take 250 mg by mouth at bedtime.   meloxicam (MOBIC) 15 MG tablet Take 1 tablet (15 mg total) by mouth daily.   metFORMIN (GLUCOPHAGE) 500 MG tablet Take 2 tablets (1,000 mg total) by mouth 2 (two) times daily with a meal.   MILK THISTLE PO Take 1-2 tablets by mouth See admin instructions. Take 1 tablet in the morning and 2 tablets at night (Patient not taking: Reported on 07/07/2022)   Multiple Vitamins-Minerals (CENTRUM SILVER PO) Take 1 tablet by mouth at bedtime.  naproxen sodium (ALEVE) 220 MG tablet Take 440 mg by mouth 2 (two) times daily as needed (pain). (Patient not taking: Reported on 07/07/2022)   omega-3 acid ethyl esters (LOVAZA) 1 g capsule Take 1 capsule (1 g total) by mouth 2 (two) times daily.   Potassium 99 MG TABS Take 99 mg by mouth 2 (two) times a day.   TURMERIC PO Take 1,500 mg by mouth 2 (two) times a day.   vitamin C (ASCORBIC ACID) 500 MG tablet Take 500 mg by mouth 2 (two) times a day.   No current facility-administered medications for this visit. (Other)   REVIEW OF SYSTEMS:   ALLERGIES Allergies  Allergen Reactions    Amlodipine Swelling    LEedema, possibly rash   Cymbalta [Duloxetine Hcl] Other (See Comments)    Nervous   Sulfa Antibiotics Other (See Comments)    Unknown reaction   PAST MEDICAL HISTORY Past Medical History:  Diagnosis Date   Ankle fracture    Arthritis    knees   Cataract    beginning   Diabetes mellitus without complication (HCC)    Diabetic neuropathy ([place]) 03/30/2017   GERD (gastroesophageal reflux disease)    Hyperlipidemia    Hypertension    Neuropathy    feet,hands   Obesity    Sleep apnea    cpap   Past Surgical History:  Procedure Laterality Date   APPENDECTOMY  1955   childhood   COLON SURGERY     age 50   COLONOSCOPY     KNEE ARTHROSCOPY WITH LATERAL MENISECTOMY Left 05/15/2019   Procedure: LEFT KNEE ARTHOSCOPY PARTIAL MENISCECTOMY, REMOVAL OF LOOSE BODY;  Surgeon: Marybelle Killings, MD;  Location: [facility];  Service: Orthopedics;  Laterality: Left;   FAMILY HISTORY Family History  Problem Relation Age of Onset   Cancer Mother 20       Colon   Colon cancer Mother 10   Stroke Father 19   Diabetes Father    Heart disease Father    Hypertension Father    Hypertension Sister    Diabetes Sister    Kidney disease Sister        due to HTN and DM   Hypertension Sister    Drug abuse Sister        narcotic abuse   Stroke Sister        associated with narcotic abuse   Diabetes Sister    Hypertension Sister    Hyperlipidemia Sister    Hypertension Brother    Stroke Brother    Hypertension Brother    Stroke Brother    Hypertension Brother    Hypertension Daughter    Diabetes Daughter    Obesity Daughter        gastric sleeve surgery 03/05/2014   Drug abuse Son        cocaine   Esophageal cancer Neg Hx    Rectal cancer Neg Hx    Stomach cancer Neg Hx    Crohn's disease Neg Hx    Ulcerative colitis Neg Hx    SOCIAL HISTORY Social History   Tobacco Use   Smoking status: Never    Passive exposure: Current (husband smoke)   Smokeless tobacco:  Never  Vaping Use   Vaping Use: Never used  Substance Use Topics   Alcohol use: Yes    Comment: rarely   Drug use: No       OPHTHALMIC EXAM:  Not recorded    IMAGING AND PROCEDURES  Imaging  and Procedures for 11/17/2022          ASSESSMENT/PLAN:  No diagnosis found.  1. Diabetes mellitus, type 2 without retinopathy  - A1c: 12.7 on 12.21.22, 13.5 on 08.25.22 - FA (7.26.22) shows no MA no NV OU - The incidence, risk factors for progression, natural history and treatment options for diabetic retinopathy  were discussed with patient.   - The need for close monitoring of blood glucose, blood pressure, and serum lipids, avoiding cigarette or any type of tobacco, and the need for long term follow up was also discussed with patient. - f/u in 1 year, sooner prn  2. Retinal hemmorhage OS -- resolved today  - mild focal flame heme inferior to fovea -- resolved - likley related to HTN retinopathy  - no ophthalmic intervention indicated or recommended at this time   3,4. Hypertensive retinopathy OU - discussed importance of tight BP control - monitor   5. Mixed Cataract OU - The symptoms of cataract, surgical options, and treatments and risks were discussed with patient. - discussed diagnosis and progression - under the expert management of [place] - monitor   Ophthalmic Meds Ordered this visit:  No orders of the defined types were placed in this encounter.    No follow-ups on file.  There are no Patient Instructions on file for this visit.  Explained the diagnoses, plan, and follow up with the patient and they expressed understanding.  Patient expressed understanding of the importance of proper follow up care.   This document serves as a record of services personally performed by Gardiner Sleeper, MD, PhD. It was created on their behalf by Renaldo Reel, [place] an ophthalmic technician. The creation of this record is the provider's dictation and/or activities  during the visit.    Electronically signed by:  Renaldo Reel, COT 1.11.24  9:27 AM   Gardiner Sleeper, M.D., Ph.D. Diseases & Surgery of the Retina and Vitreous [facility]: M myopia (nearsighted); A astigmatism; H hyperopia (farsighted); P presbyopia; Mrx spectacle prescription;  CTL contact lenses; OD right eye; OS left eye; OU both eyes  XT exotropia; ET esotropia; PEK punctate epithelial keratitis; PEE punctate epithelial erosions; DES dry eye syndrome; MGD meibomian gland dysfunction; ATs artificial tears; PFAT's preservative free artificial tears; [place] nuclear sclerotic cataract; PSC posterior subcapsular cataract; ERM epi-retinal membrane; PVD posterior vitreous detachment; RD retinal detachment; DM diabetes mellitus; DR diabetic retinopathy; NPDR non-proliferative diabetic retinopathy; PDR proliferative diabetic retinopathy; CSME clinically significant macular edema; DME diabetic macular edema; dbh dot blot hemorrhages; CWS cotton wool spot; POAG primary open angle glaucoma; C/D cup-to-disc ratio; HVF humphrey visual field; GVF goldmann visual field; OCT optical coherence tomography; IOP intraocular pressure; BRVO Branch retinal vein occlusion; CRVO central retinal vein occlusion; CRAO central retinal artery occlusion; BRAO branch retinal artery occlusion; RT retinal tear; SB scleral buckle; PPV pars plana vitrectomy; VH Vitreous hemorrhage; PRP panretinal laser photocoagulation; IVK intravitreal kenalog; VMT vitreomacular traction; MH Macular hole;  NVD neovascularization of the disc; NVE neovascularization elsewhere; AREDS age related eye disease study; ARMD age related macular degeneration; POAG primary open angle glaucoma; EBMD epithelial/anterior basement membrane dystrophy; ACIOL anterior chamber intraocular lens; IOL intraocular lens; PCIOL posterior chamber intraocular lens; Phaco/IOL phacoemulsification with intraocular lens placement; [place]  photorefractive keratectomy; LASIK laser assisted in situ keratomileusis; HTN hypertension; DM diabetes mellitus; COPD chronic obstructive pulmonary disease

## 2022-11-17 ENCOUNTER — Encounter (INDEPENDENT_AMBULATORY_CARE_PROVIDER_SITE_OTHER): Payer: PPO | Admitting: Ophthalmology

## 2022-11-17 DIAGNOSIS — I10 Essential (primary) hypertension: Secondary | ICD-10-CM

## 2022-11-17 DIAGNOSIS — H35033 Hypertensive retinopathy, bilateral: Secondary | ICD-10-CM

## 2022-11-17 DIAGNOSIS — H25813 Combined forms of age-related cataract, bilateral: Secondary | ICD-10-CM

## 2022-11-17 DIAGNOSIS — H3562 Retinal hemorrhage, left eye: Secondary | ICD-10-CM

## 2022-11-17 DIAGNOSIS — E119 Type 2 diabetes mellitus without complications: Secondary | ICD-10-CM

## 2023-02-25 ENCOUNTER — Institutional Professional Consult (permissible substitution): Payer: PPO | Admitting: Adult Health

## 2023-03-10 ENCOUNTER — Telehealth: Payer: Self-pay | Admitting: *Deleted

## 2023-03-10 NOTE — Telephone Encounter (Signed)
Called patient to remind her to either bring her Bipap or SD card with her tomorrow so we can get her data off of it.  Also requested the dame of her DME company.

## 2023-03-11 ENCOUNTER — Institutional Professional Consult (permissible substitution): Payer: PPO | Admitting: Adult Health

## 2023-10-06 ENCOUNTER — Encounter: Payer: Self-pay | Admitting: Neurology

## 2023-11-15 NOTE — Progress Notes (Signed)
Initial neurology clinic note  Reason for Evaluation: Consultation requested by Porfirio Oar, PA for an opinion regarding neuropathy. My final recommendations will be communicated back to the requesting physician by way of shared medical record or letter to requesting physician via [place] mail.  HPI: This is [name], a 78 y.o. right-handed female with a medical history of lumbar spondylosis, DM, HTN, HLD, diverticulosis, OSA, osteopenia, OA who presents to neurology clinic with the chief complaint of neuropathy. The patient is alone today.  Patient's symptoms have been present at least 10 years. She first noticed tingling in her feet and she would notice her feet get tired quickly. It progressed to stabbing pain and numbness. It has come up her legs over the years. She has symptoms of tingling in her hands in the last few years. She also has imbalance for the last few years. She walks with not assistive device currently. She has fallen maybe 2 times in the last year, once tripping over her son's leg and once when her dog pulled her down.  She also endorses low back pain. MRI lumbar spine in 2019 showed moderate spinal stenosis at L4-5. She has occasional neck pain.  She had an EMG by Dr. Anne Hahn at [facility] in 2018 that showed no lumbosacral radiculopathy but a mild axonal polyneuropathy.  She is on a B complex.  She is currently on gabapentin. She is prescribed 600 mg during the day and 1200 mg at night. She does not always take the day dose. She tried Cymbalta, but could not tolerate.  The patient does not report symptoms referable to autonomic dysfunction including impaired sweating, heat or cold intolerance, excessive mucosal dryness, gastroparetic early satiety, postprandial abdominal bloating, constipation, bowel or bladder dyscontrol, or syncope/presyncope/orthostatic intolerance.  She does not report any constitutional symptoms like fever, night sweats, anorexia or unintentional  weight loss.  EtOH use: very rare  Restrictive diet? No Family history of neuropathy/myopathy/neurologic disease? No   MEDICATIONS:  Outpatient Encounter Medications as of 11/25/2023  Medication Sig   aspirin 81 MG tablet Take 81 mg by mouth at bedtime.    atorvastatin (LIPITOR) 40 MG tablet Take 1 tablet (40 mg total) by mouth daily. (Patient taking differently: Take 20 mg by mouth every evening.)   azelastine (ASTELIN) 0.1 % nasal spray Place 2 sprays into both nostrils 2 (two) times daily. Use in each nostril as directed (Patient taking differently: Place 2 sprays into both nostrils 2 (two) times daily as needed for rhinitis. Use in each nostril as directed)   b complex vitamins tablet Take 1 tablet daily by mouth.   calcium carbonate (TUMS - DOSED IN MG ELEMENTAL CALCIUM) 500 MG chewable tablet Chew 1-2 tablets by mouth daily as needed for indigestion or heartburn.   CALCIUM CITRATE-VITAMIN D PO Take 1 tablet by mouth 2 (two) times a day.    canagliflozin (INVOKANA) 100 MG TABS tablet Take by mouth.   chlorhexidine (PERIDEX) 0.12 % solution USE AS DIRECTED, USE 15 MLS IN THE MOUTH OR THROAT 2 TIMES A DAY (Patient taking differently: as needed. USE AS DIRECTED, USE 15 MLS IN THE MOUTH OR THROAT 2 TIMES A DAY)   COD LIVER OIL W/VIT A & D PO Take 1 capsule by mouth at bedtime.   Coenzyme Q10 (COQ-10) 100 MG CAPS Take 100 mg by mouth at bedtime.   cyclobenzaprine (FLEXERIL) 10 MG tablet Take 1 tablet (10 mg total) by mouth at bedtime as needed for muscle spasms.  gabapentin (NEURONTIN) 600 MG tablet Take 1 tablet (600 mg total) by mouth at bedtime. (Patient taking differently: Take 1,800 mg by mouth at bedtime. 1 tablet in am and 2 tablet at night)   GARLIC PO Take by mouth daily.   hydrochlorothiazide (HYDRODIURIL) 25 MG tablet Take 1 tablet (25 mg total) by mouth daily with breakfast.   losartan (COZAAR) 100 MG tablet Take 1 tablet (100 mg total) by mouth daily. (Patient taking differently:  Take 100 mg by mouth at bedtime.)   Magnesium 250 MG TABS Take 250 mg by mouth at bedtime.   meloxicam (MOBIC) 15 MG tablet Take 1 tablet (15 mg total) by mouth daily. (Patient taking differently: Take 15 mg by mouth as needed.)   metFORMIN (GLUCOPHAGE) 500 MG tablet Take 2 tablets (1,000 mg total) by mouth 2 (two) times daily with a meal. (Patient taking differently: Take 1,000 mg by mouth 2 (two) times daily with a meal. One time a day)   MILK THISTLE PO Take 1-2 tablets by mouth See admin instructions. Take 1 tablet in the morning and 2 tablets at night   Multiple Vitamins-Minerals (CENTRUM SILVER PO) Take 1 tablet by mouth at bedtime.   naproxen sodium (ALEVE) 220 MG tablet Take 440 mg by mouth 2 (two) times daily as needed (pain).   omega-3 acid ethyl esters (LOVAZA) 1 g capsule Take 1 capsule (1 g total) by mouth 2 (two) times daily. (Patient taking differently: Take 1 g by mouth 2 (two) times daily. Pt takes 4 a day)   Potassium 99 MG TABS Take 99 mg by mouth 2 (two) times a day.   TURMERIC PO Take 1,500 mg by mouth 2 (two) times a day.   vitamin C (ASCORBIC ACID) 500 MG tablet Take 500 mg by mouth 2 (two) times a day.   linagliptin (TRADJENTA) 5 MG TABS tablet Take 1 tablet (5 mg total) by mouth daily. (Patient not taking: Reported on 11/25/2023)   No facility-administered encounter medications on file as of 11/25/2023.    PAST MEDICAL HISTORY: Past Medical History:  Diagnosis Date   Ankle fracture    Arthritis    knees   Cataract    beginning   Diabetes mellitus without complication (HCC)    Diabetic neuropathy (HCC) 03/30/2017   GERD (gastroesophageal reflux disease)    Hyperlipidemia    Hypertension    Neuropathy    feet,hands   Obesity    Sleep apnea    cpap    PAST SURGICAL HISTORY: Past Surgical History:  Procedure Laterality Date   APPENDECTOMY  1955   childhood   COLON SURGERY     age 25   COLONOSCOPY     KNEE ARTHROSCOPY WITH LATERAL MENISECTOMY Left  05/15/2019   Procedure: LEFT KNEE ARTHOSCOPY PARTIAL MENISCECTOMY, REMOVAL OF LOOSE BODY;  Surgeon: Eldred Manges, MD;  Location: [facility] OR;  Service: Orthopedics;  Laterality: Left;    ALLERGIES: Allergies  Allergen Reactions   Amlodipine Swelling    LEedema, possibly rash   Cymbalta [Duloxetine Hcl] Other (See Comments)    Nervous   Sulfa Antibiotics Other (See Comments)    Unknown reaction    FAMILY HISTORY: Family History  Problem Relation Age of Onset   Cancer Mother 53       Colon   Colon cancer Mother 76   Stroke Father 38   Diabetes Father    Heart disease Father    Hypertension Father    Hypertension Sister  Diabetes Sister    Kidney disease Sister        due to HTN and DM   Hypertension Sister    Drug abuse Sister        narcotic abuse   Stroke Sister        associated with narcotic abuse   Diabetes Sister    Hypertension Sister    Hyperlipidemia Sister    Hypertension Brother    Stroke Brother    Hypertension Brother    Stroke Brother    Hypertension Brother    Hypertension Daughter    Diabetes Daughter    Obesity Daughter        gastric sleeve surgery 03/05/2014   Drug abuse Son        cocaine   Esophageal cancer Neg Hx    Rectal cancer Neg Hx    Stomach cancer Neg Hx    Crohn's disease Neg Hx    Ulcerative colitis Neg Hx     SOCIAL HISTORY: Social History   Tobacco Use   Smoking status: Never    Passive exposure: Current (husband smoke)   Smokeless tobacco: Never  Vaping Use   Vaping status: Never Used  Substance Use Topics   Alcohol use: Yes    Comment: rarely   Drug use: No   Social History   Social History Narrative   Lives with her husband.  Her adult children live locally.      College education, works as a Customer service manager.      Right-handed   Caffeine: 1-2 cups coffee and one soda per day     OBJECTIVE: PHYSICAL EXAM: BP (!) 198/70   Pulse 63   Ht 5\' 2"  (1.575 m)   Wt 195 lb (88.5 kg)   SpO2 98%   BMI 35.67  kg/m   General: General appearance: Awake and alert. No distress. Cooperative with exam.  Skin: No obvious rash or jaundice. HEENT: Atraumatic. Anicteric. Lungs: Non-labored breathing on room air  Extremities: No edema. Arthritic changes in hands and feet Psych: Affect appropriate.  Neurological: Mental Status: Alert. Speech fluent. No pseudobulbar affect Cranial Nerves: CNII: No RAPD. Visual fields grossly intact. CNIII, IV, VI: PERRL. No nystagmus. EOMI. CN V: Facial sensation intact bilaterally to fine touch. CN VII: Facial muscles symmetric and strong. No ptosis at rest. CN VIII: Hearing grossly intact bilaterally. CN IX: No hypophonia. CN X: Palate elevates symmetrically. CN XI: Full strength shoulder shrug bilaterally. CN XII: Tongue protrusion full and midline. No atrophy or fasciculations. No significant dysarthria Motor: Tone is normal. Strength is 5/5 in bilateral upper and lower extremities Reflexes:  Right Left   Bicep 2+ 2+   Tricep 2+ 2+   BrRad 2+ 2+   Knee Trace Trace   Ankle 0 0    Pathological Reflexes: Babinski: flexor response bilaterally Hoffman: absent bilaterally Troemner: absent bilaterally Sensation: Pinprick: Diminished to mid calf in bilateral lower extremities, otherwise intact Vibration: Diminished in bilateral lower extremities to knees Proprioception: Diminished in bilateral great toes (inconsistent response) Coordination: Intact finger-to- nose-finger bilaterally. Romberg with mild sway Gait: Able to rise from chair with arms crossed unassisted. Narrow-based gait  Lab and Test Review: External labs: 10/04/23: HbA1c: 6.4 CMP significant for glucose 164, alk phos 124 Lipid panel: tChol 179, LDL 107, TG 147 B12: 807  Imaging/Procedures: Lumbar spine xray (01/18/2018): AP lateral and lateral lumbar flexion-extension x-rays are obtained and  reviewed.  Patient has grade 1 anterolisthesis at L4-5.  Anterior endplate  spurring with  mild narrowing L1-2,L2-3.  Calcification of the abdominal  aorta.   Impression: Minimal anterolisthesis L4-5 unchanged from previous  radiographs 2015 and MRI 2018.   MRI lumbar spine wo contrast (01/26/2018): FINDINGS: Segmentation:  Standard.   Alignment:  Unchanged trace anterolisthesis of L4 on L5.   Vertebrae: Increased marrow edema extending along the L3 superior endplate to the right of midline which appears to be associated with a very small Schmorl's node. No fracture or suspicious osseous lesion. Mild degenerative endplate changes in the upper lumbar and lower thoracic spine.   Conus medullaris and cauda equina: Conus extends to the L1-2 level. Conus and cauda equina appear normal.   Paraspinal and other soft tissues: Unremarkable.   Disc levels:   Disc desiccation throughout the lumbar and lower thoracic spine with exception of L5-S1. Mild disc space narrowing at L1-2 and in the lower thoracic spine, unchanged.   T10-11: Only imaged sagittally. Moderate facet arthrosis resulting in at most mild bilateral neural foraminal stenosis, similar to prior. No spinal stenosis.   T11-12: Only imaged sagittally. Mild facet arthrosis without stenosis, similar to prior.   T12-L1: Minimal disc bulging without stenosis, unchanged.   L1-2: Minimal disc bulging and mild facet hypertrophy without stenosis, unchanged.   L2-3: Circumferential disc bulging, a broad left foraminal disc protrusion, and mild facet hypertrophy result in mild right and mild-to-moderate left lateral recess stenosis and mild spinal stenosis, progressed from prior. No significant neural foraminal stenosis.   L3-4: Mild facet and ligamentum flavum hypertrophy without significant stenosis, unchanged. Unchanged 4 mm cyst within the right ligamentum flavum.   L4-5: Anterolisthesis with slight disc uncovering, ligamentum flavum thickening, and severe facet arthrosis result in moderate spinal stenosis and  moderate right greater than left lateral recess stenosis, unchanged. No neural foraminal stenosis.   L5-S1: Mild facet hypertrophy without disc herniation or stenosis.   IMPRESSION: 1. Mildly progressive disc degeneration at L2-3 resulting in mild spinal stenosis and left greater than right lateral recess stenosis. 2. Unchanged moderate multifactorial spinal stenosis at L4-5.  Cervical spine xray (05/01/2006): CERVICAL SPINE - 5 VIEWS:  Five views of the cervical spine were obtained. The cervical vertebrae are in normal alignment. There is degenerative disc disease at C5-6 and to a lesser degree C6-7 levels. On oblique view only mild foraminal narrowing is noted at C5-6 bilaterally. The odontoid process is intact.   IMPRESSION:     Degenerative disc disease at C5-6 with mild foraminal narrowing. Normal alignment.   Thoracic spine xray (05/01/2006): THORACIC SPINE - 2 VIEWS:  Two views of the thoracic spine show normal alignment with only mild degenerative change. No acute bony abnormality is seen.  IMPRESSION:     No acute abnormality. Mild degenerative change.   EMG (03/30/2017 at [facility] by Dr. Anne Hahn): NERVE CONDUCTION STUDIES:   Nerve conduction studies were performed on the left upper extremity. The distal motor latencies and motor amplitudes for the median and ulnar nerves were within normal limits. The nerve conduction velocities for these nerves were also normal. The sensory latencies for the median and ulnar nerves were normal. The F wave latency for the left ulnar nerve was normal.   Nerve conduction studies were performed on both lower extremities. The distal motor latencies for the peroneal and posterior tibial nerves were normal bilaterally with low motor amplitudes for the right peroneal nerve and for the posterior tibial nerves bilaterally. The nerve conduction velocities for the peroneal and posterior tibial nerves were normal bilaterally.  The sensory latencies for the sural nerves  were normal bilaterally but unobtainable for the peroneal nerves bilaterally. The posterior tibial F wave latencies were within normal limits bilaterally.   EMG STUDIES:   EMG study was performed on the left lower extremity:   The tibialis anterior muscle reveals 2 to 4K motor units with full recruitment. No fibrillations or positive waves were seen. The peroneus tertius muscle reveals 2 to 4K motor units with full recruitment. No fibrillations or positive waves were seen. The medial gastrocnemius muscle reveals 1 to 3K motor units with full recruitment. No fibrillations or positive waves were seen. The vastus lateralis muscle reveals 2 to 4K motor units with full recruitment. No fibrillations or positive waves were seen. The iliopsoas muscle reveals 2 to 4K motor units with full recruitment. No fibrillations or positive waves were seen. The biceps femoris muscle (long head) reveals 2 to 4K motor units with full recruitment. No fibrillations or positive waves were seen. The lumbosacral paraspinal muscles were tested at 3 levels, and revealed no abnormalities of insertional activity at all 3 levels tested. There was good relaxation.     IMPRESSION:   Nerve conduction studies done on the left upper extremity and both lower extremities shows findings that are consistent with a mild primarily axonal peripheral neuropathy. EMG evaluation of the left lower extremity was unremarkable, without evidence of an overlying lumbosacral radiculopathy.   ASSESSMENT: Karina Davis is a 78 y.o. female who presents for evaluation of numbness and tingling in legs and hands and imbalance. She has a relevant medical history of lumbar spondylosis, DM, HTN, HLD, diverticulosis, OSA, osteopenia, OA. Her neurological examination is pertinent for sensory loss in a length dependent fashion with hyporeflexia in lower extremities. Available diagnostic data is significant for HbA1c of 6.4, B12 of 807, MRI lumbar spine from  2019 showing moderate lumbar stenosis at L4-5, and EMG from 2018 showing a mild axonal polyneuropathy. 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 She has a known risk factor of diabetes. I will send labs to look for other treatable causes. I discussed neuropathy with patient today, including that there is no cure for neuropathy and that the goal is to prevent worsening if possible and that reversal is usually not possible. Other potentially contributing causes include lumbar spondylosis/lumbar spinal stenosis and arthritis.  PLAN: -Blood work: B1, B6, IFE -Recommend patient be more consistent with gabapentin 600 mg in the morning, 1200 mg in the alarm -Lidocaine cream PRN  -Alpha lipoic acid 600 mg once or twice daily  -Return to clinic in 6 months  The impression above as well as the plan as outlined below were extensively discussed with the patient who voiced understanding. All questions were answered to their satisfaction.  The patient was counseled on pertinent fall precautions per the printed material provided today, and as noted under the "Patient Instructions" section below.  When available, results of the above investigations and possible further recommendations will be communicated to the patient via telephone/MyChart. Patient to call office if not contacted after expected testing turnaround time.   Total time spent reviewing records, interview, history/exam, documentation, and coordination of care on day of encounter:  60 min   Thank you for allowing me to participate in patient's care.  If I can answer any additional questions, I would be pleased to do so.  Jacquelyne Balint, MD   CC: Porfirio Oar, [place] [address] [place] [address]  CC:  Referring provider: Porfirio Oar, PA [address] [place],  [place] [address]

## 2023-11-25 ENCOUNTER — Other Ambulatory Visit: Payer: PPO

## 2023-11-25 ENCOUNTER — Ambulatory Visit (INDEPENDENT_AMBULATORY_CARE_PROVIDER_SITE_OTHER): Payer: PPO | Admitting: Neurology

## 2023-11-25 ENCOUNTER — Encounter: Payer: Self-pay | Admitting: Neurology

## 2023-11-25 VITALS — BP 198/70 | HR 63 | Ht 62.0 in | Wt 195.0 lb

## 2023-11-25 DIAGNOSIS — G629 Polyneuropathy, unspecified: Secondary | ICD-10-CM | POA: Diagnosis not present

## 2023-11-25 DIAGNOSIS — M48061 Spinal stenosis, lumbar region without neurogenic claudication: Secondary | ICD-10-CM | POA: Diagnosis not present

## 2023-11-25 DIAGNOSIS — R2689 Other abnormalities of gait and mobility: Secondary | ICD-10-CM

## 2023-11-25 DIAGNOSIS — M4306 Spondylolysis, lumbar region: Secondary | ICD-10-CM | POA: Diagnosis not present

## 2023-11-25 DIAGNOSIS — M1990 Unspecified osteoarthritis, unspecified site: Secondary | ICD-10-CM

## 2023-11-25 NOTE — Patient Instructions (Addendum)
I saw you today for neuropathy. Your only known risk factor is diabetes, but I will send labs to look for other potential causes today.  I will be in touch when I have your results.  Continue gabapentin, but try to take more consistently to help with the nerve pain: 600 mg in the daytime and 1200 mg at night time. Set alarms reminding you to take it if needed.  Alpha lipoic acid 600mg  daily has some research data suggesting it helps with nerve health. No major side effects other than <1% of people report upset stomach. This can be taken twice per day (1200mg  daily) if no relief obtained. You can buy this over the counter or online.   You can also try Lidocaine cream as needed. Apply wear you have pain, tingling, or burning. Wear gloves to prevent your hands being numb. This can be bought over the counter at any drug store or online.  I will see you back in clinic in 6 months.  Please let me know if you have any questions or concerns in the meantime.   The physicians and staff at [facility] are committed to providing excellent care. You may receive a survey requesting feedback about your experience at our office. We strive to receive "very good" responses to the survey questions. If you feel that your experience would prevent you from giving the office a "very good " response, please contact our office to try to remedy the situation. We may be reached at [phone]. Thank you for taking the time out of your busy day to complete the survey.  Jacquelyne Balint, MD [facility]  Preventing Falls at [facility] are common, often dreaded events in the lives of older people. Aside from the obvious injuries and even death that may result, fall can cause wide-ranging consequences including loss of independence, mental decline, decreased activity and mobility. Younger people are also at risk of falling, especially those with chronic illnesses and fatigue.  Ways to reduce risk for  falling Examine diet and medications. Warm foods and alcohol dilate blood vessels, which can lead to dizziness when standing. Sleep aids, antidepressants and pain medications can also increase the likelihood of a fall.  Get a vision exam. Poor vision, cataracts and glaucoma increase the chances of falling.  Check foot gear. Shoes should fit snugly and have a sturdy, nonskid sole and a broad, low heel  Participate in a physician-approved exercise program to build and maintain muscle strength and improve balance and coordination. Programs that use ankle weights or stretch bands are excellent for muscle-strengthening. Water aerobics programs and low-impact Tai Chi programs have also been shown to improve balance and coordination.  Increase vitamin D intake. Vitamin D improves muscle strength and increases the amount of calcium the body is able to absorb and deposit in bones.  How to prevent falls from common hazards Floors - Remove all loose wires, cords, and throw rugs. Minimize clutter. Make sure rugs are anchored and smooth. Keep furniture in its usual place.  Chairs -- Use chairs with straight backs, armrests and firm seats. Add firm cushions to existing pieces to add height.  Bathroom - Install grab bars and non-skid tape in the tub or shower. Use a bathtub transfer bench or a shower chair with a back support Use an elevated toilet seat and/or safety rails to assist standing from a low surface. Do not use towel racks or bathroom tissue holders to help you stand.  Lighting - Make sure  halls, stairways, and entrances are well-lit. Install a night light in your bathroom or hallway. Make sure there is a light switch at the top and bottom of the staircase. Turn lights on if you get up in the middle of the night. Make sure lamps or light switches are within reach of the bed if you have to get up during the night.  Kitchen - Install non-skid rubber mats near the sink and stove. Clean spills  immediately. Store frequently used utensils, pots, pans between waist and eye level. This helps prevent reaching and bending. Sit when getting things out of lower cupboards.  Living room/ Bedrooms - Place furniture with wide spaces in between, giving enough room to move around. Establish a route through the living room that gives you something to hold onto as you walk.  Stairs - Make sure treads, rails, and rugs are secure. Install a rail on both sides of the stairs. If stairs are a threat, it might be helpful to arrange most of your activities on the lower level to reduce the number of times you must climb the stairs.  Entrances and doorways - Install metal handles on the walls adjacent to the doorknobs of all doors to make it more secure as you travel through the doorway.  Tips for maintaining balance Keep at least one hand free at all times. Try using a backpack or fanny pack to hold things rather than carrying them in your hands. Never carry objects in both hands when walking as this interferes with keeping your balance.  Attempt to swing both arms from front to back while walking. This might require a conscious effort if Parkinson's disease has diminished your movement. It will, however, help you to maintain balance and posture, and reduce fatigue.  Consciously lift your feet off of the ground when walking. Shuffling and dragging of the feet is a common culprit in losing your balance.  When trying to navigate turns, use a "U" technique of facing forward and making a wide turn, rather than pivoting sharply.  Try to stand with your feet shoulder-length apart. When your feet are close together for any length of time, you increase your risk of losing your balance and falling.  Do one thing at a time. Don't try to walk and accomplish another task, such as reading or looking around. The decrease in your automatic reflexes complicates motor function, so the less distraction, the better.  Do not  wear rubber or gripping soled shoes, they might "catch" on the floor and cause tripping.  Move slowly when changing positions. Use deliberate, concentrated movements and, if needed, use a grab bar or walking aid. Count 15 seconds between each movement. For example, when rising from a seated position, wait 15 seconds after standing to begin walking.  If balance is a continuous problem, you might want to consider a walking aid such as a cane, walking stick, or walker. Once you've mastered walking with help, you might be ready to try it on your own again.

## 2023-12-01 ENCOUNTER — Encounter: Payer: Self-pay | Admitting: Neurology

## 2023-12-01 LAB — IMMUNOFIXATION ELECTROPHORESIS
IgG (Immunoglobin G), Serum: 654 mg/dL (ref 600–1540)
IgM, Serum: 37 mg/dL — ABNORMAL LOW (ref 50–300)
Immunoglobulin A: 209 mg/dL (ref 70–320)

## 2023-12-01 LAB — VITAMIN B6: Vitamin B6: 9.3 ng/mL (ref 2.1–21.7)

## 2023-12-01 LAB — VITAMIN B1: Vitamin B1 (Thiamine): 20 nmol/L (ref 8–30)

## 2024-02-08 ENCOUNTER — Ambulatory Visit: Payer: PPO | Admitting: Podiatry

## 2024-02-09 ENCOUNTER — Other Ambulatory Visit: Payer: PPO

## 2024-02-24 ENCOUNTER — Other Ambulatory Visit

## 2024-02-24 ENCOUNTER — Ambulatory Visit: Admitting: Podiatry

## 2024-04-04 ENCOUNTER — Encounter: Payer: Self-pay | Admitting: Podiatry

## 2024-04-04 ENCOUNTER — Ambulatory Visit (INDEPENDENT_AMBULATORY_CARE_PROVIDER_SITE_OTHER): Admitting: Podiatry

## 2024-04-04 DIAGNOSIS — M76821 Posterior tibial tendinitis, right leg: Secondary | ICD-10-CM | POA: Diagnosis not present

## 2024-04-04 DIAGNOSIS — M2041 Other hammer toe(s) (acquired), right foot: Secondary | ICD-10-CM

## 2024-04-04 DIAGNOSIS — M2042 Other hammer toe(s) (acquired), left foot: Secondary | ICD-10-CM

## 2024-04-04 DIAGNOSIS — M7751 Other enthesopathy of right foot: Secondary | ICD-10-CM

## 2024-04-04 DIAGNOSIS — Q666 Other congenital valgus deformities of feet: Secondary | ICD-10-CM | POA: Diagnosis not present

## 2024-04-04 MED ORDER — TRIAMCINOLONE ACETONIDE 40 MG/ML IJ SUSP
20.0000 mg | Freq: Once | INTRAMUSCULAR | Status: AC
  Administered 2024-04-04: 20 mg

## 2024-04-04 NOTE — Addendum Note (Signed)
 Addended by: Sanda Crome on: 04/04/2024 04:24 PM   Modules accepted: Orders, Level of Service

## 2024-04-04 NOTE — Progress Notes (Signed)
 She presents today after having not seen her for more than a year she states that she may need another shot as she refers to the posterior tibial tendinitis of the right side.  States it is becoming a little more weak and tender.  She states that she still has none neuropathy she maintains it with her gabapentin .  She takes it at nighttime.  Objective: Vital signs are stable oriented x3.  Pulses are palpable.  Neurologic sensorium is diminished per Semmes Weinstein monofilament muscle strength appears to be normal in all groups.  She does have tenderness on palpation of the posterior tibial tendon right with some mild fluctuance of the tendon sheath.  But strength appears to be normal.  She has tenderness on palpation of the subtalar joint.  She has tenderness on end range of motion and on palpation of the sinus tarsi.  Assessment: Diabetes mellitus with diabetic peripheral neuropathy digital deformities posterior tibial tendinitis.  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  She will follow-up with [facility] prosthetics for diabetic shoes.

## 2024-05-15 NOTE — Progress Notes (Deleted)
 NEUROLOGY FOLLOW UP OFFICE NOTE  Karina Davis [id number]  Subjective:  Karina Davis is a 78 y.o. year old right-handed female with a medical history of lumbar spondylosis, DM, HTN, HLD, diverticulosis, OSA, osteopenia, OA who we last saw on 11/25/23 for neuropathy.  To briefly review: 11/25/23: Patient's symptoms have been present at least 10 years. She first noticed tingling in her feet and she would notice her feet get tired quickly. It progressed to stabbing pain and numbness. It has come up her legs over the years. She has symptoms of tingling in her hands in the last few years. She also has imbalance for the last few years. She walks with not assistive device currently. She has fallen maybe 2 times in the last year, once tripping over her son's leg and once when her dog pulled her down.   She also endorses low back pain. MRI lumbar spine in 2019 showed moderate spinal stenosis at L4-5. She has occasional neck pain.   She had an EMG by Dr. Jenel at [facility] in 2018 that showed no lumbosacral radiculopathy but a mild axonal polyneuropathy.   She is on a B complex.   She is currently on gabapentin . She is prescribed 600 mg during the day and 1200 mg at night. She does not always take the day dose. She tried Cymbalta , but could not tolerate.   The patient does not report symptoms referable to autonomic dysfunction including impaired sweating, heat or cold intolerance, excessive mucosal dryness, gastroparetic early satiety, postprandial abdominal bloating, constipation, bowel or bladder dyscontrol, or syncope/presyncope/orthostatic intolerance.   She does not report any constitutional symptoms like fever, night sweats, anorexia or unintentional weight loss.   EtOH use: very rare  Restrictive diet? No Family history of neuropathy/myopathy/neurologic disease? No  Most recent Assessment and Plan (11/25/23): Karina Davis is a 78 y.o. female who presents for evaluation of numbness and tingling in  legs and hands and imbalance. She has a relevant medical history of lumbar spondylosis, DM, HTN, HLD, diverticulosis, OSA, osteopenia, OA. Her neurological examination is pertinent for sensory loss in a length dependent fashion with hyporeflexia in lower extremities. Available diagnostic data is significant for HbA1c of 6.4, B12 of 807, MRI lumbar spine from 2019 showing moderate lumbar stenosis at L4-5, and EMG from 2018 showing a mild axonal polyneuropathy. 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 She has a known risk factor of diabetes. I will send labs to look for other treatable causes. I discussed neuropathy with patient today, including that there is no cure for neuropathy and that the goal is to prevent worsening if possible and that reversal is usually not possible. Other potentially contributing causes include lumbar spondylosis/lumbar spinal stenosis and arthritis.   PLAN: -Blood work: B1, B6, IFE -Recommend patient be more consistent with gabapentin  600 mg in the morning, 1200 mg in the evening -Lidocaine  cream PRN  -Alpha lipoic acid 600 mg once or twice daily  Since their last visit: Labs were normal. ***  MEDICATIONS:  Outpatient Encounter Medications as of 05/26/2024  Medication Sig   aspirin 81 MG tablet Take 81 mg by mouth at bedtime.    atorvastatin  (LIPITOR) 40 MG tablet Take 1 tablet (40 mg total) by mouth daily. (Patient taking differently: Take 20 mg by mouth every evening.)   azelastine  (ASTELIN ) 0.1 % nasal spray Place 2 sprays into both nostrils 2 (two) times daily. Use in each nostril as directed (Patient taking differently: Place 2 sprays  into both nostrils 2 (two) times daily as needed for rhinitis. Use in each nostril as directed)   b complex vitamins tablet Take 1 tablet daily by mouth.   calcium  carbonate (TUMS - DOSED IN MG ELEMENTAL CALCIUM ) 500 MG chewable tablet Chew 1-2 tablets by mouth daily as needed for  indigestion or heartburn.   CALCIUM  CITRATE-VITAMIN D PO Take 1 tablet by mouth 2 (two) times a day.    canagliflozin  (INVOKANA ) 100 MG TABS tablet Take by mouth.   chlorhexidine  (PERIDEX ) 0.12 % solution USE AS DIRECTED, USE 15 MLS IN THE MOUTH OR THROAT 2 TIMES A DAY (Patient taking differently: as needed. USE AS DIRECTED, USE 15 MLS IN THE MOUTH OR THROAT 2 TIMES A DAY)   COD LIVER OIL W/VIT A & D PO Take 1 capsule by mouth at bedtime.   Coenzyme Q10 (COQ-10) 100 MG CAPS Take 100 mg by mouth at bedtime.   cyclobenzaprine  (FLEXERIL ) 10 MG tablet Take 1 tablet (10 mg total) by mouth at bedtime as needed for muscle spasms.   gabapentin  (NEURONTIN ) 600 MG tablet Take 1 tablet (600 mg total) by mouth at bedtime. (Patient taking differently: Take 1,800 mg by mouth at bedtime. 1 tablet in am and 2 tablet at night)   GARLIC PO Take by mouth daily.   hydrochlorothiazide  (HYDRODIURIL ) 25 MG tablet Take 1 tablet (25 mg total) by mouth daily with breakfast.   linagliptin  (TRADJENTA ) 5 MG TABS tablet Take 1 tablet (5 mg total) by mouth daily. (Patient not taking: Reported on 11/25/2023)   losartan  (COZAAR ) 100 MG tablet Take 1 tablet (100 mg total) by mouth daily. (Patient taking differently: Take 100 mg by mouth at bedtime.)   Magnesium 250 MG TABS Take 250 mg by mouth at bedtime.   meloxicam  (MOBIC ) 15 MG tablet Take 1 tablet (15 mg total) by mouth daily. (Patient taking differently: Take 15 mg by mouth as needed.)   metFORMIN  (GLUCOPHAGE ) 500 MG tablet Take 2 tablets (1,000 mg total) by mouth 2 (two) times daily with a meal. (Patient taking differently: Take 1,000 mg by mouth 2 (two) times daily with a meal. One time a day)   MILK THISTLE PO Take 1-2 tablets by mouth See admin instructions. Take 1 tablet in the morning and 2 tablets at night   Multiple Vitamins-Minerals (CENTRUM SILVER PO) Take 1 tablet by mouth at bedtime.   naproxen sodium (ALEVE) 220 MG tablet Take 440 mg by mouth 2 (two) times daily as  needed (pain).   omega-3 acid ethyl esters (LOVAZA ) 1 g capsule Take 1 capsule (1 g total) by mouth 2 (two) times daily. (Patient taking differently: Take 1 g by mouth 2 (two) times daily. Pt takes 4 a day)   Potassium 99 MG TABS Take 99 mg by mouth 2 (two) times a day.   TURMERIC PO Take 1,500 mg by mouth 2 (two) times a day.   vitamin C (ASCORBIC ACID) 500 MG tablet Take 500 mg by mouth 2 (two) times a day.   No facility-administered encounter medications on file as of 05/26/2024.    PAST MEDICAL HISTORY: Past Medical History:  Diagnosis Date   Ankle fracture    Arthritis    knees   Cataract    beginning   Diabetes mellitus without complication (HCC)    Diabetic neuropathy (HCC) 03/30/2017   GERD (gastroesophageal reflux disease)    Hyperlipidemia    Hypertension    Neuropathy    feet,hands   Obesity  Sleep apnea    cpap    PAST SURGICAL HISTORY: Past Surgical History:  Procedure Laterality Date   APPENDECTOMY  1955   childhood   COLON SURGERY     age 82   COLONOSCOPY     KNEE ARTHROSCOPY WITH LATERAL MENISECTOMY Left 05/15/2019   Procedure: LEFT KNEE ARTHOSCOPY PARTIAL MENISCECTOMY, REMOVAL OF LOOSE BODY;  Surgeon: Barbarann Oneil BROCKS, MD;  Location: [facility] OR;  Service: Orthopedics;  Laterality: Left;    ALLERGIES: Allergies  Allergen Reactions   Amlodipine Swelling    LEedema, possibly rash   Cymbalta  [Duloxetine  Hcl] Other (See Comments)    Nervous   Sulfa  Antibiotics Other (See Comments)    Unknown reaction    FAMILY HISTORY: Family History  Problem Relation Age of Onset   Cancer Mother 35       Colon   Colon cancer Mother 12   Stroke Father 3   Diabetes Father    Heart disease Father    Hypertension Father    Hypertension Sister    Diabetes Sister    Kidney disease Sister        due to HTN and DM   Hypertension Sister    Drug abuse Sister        narcotic abuse   Stroke Sister        associated with narcotic abuse   Diabetes Sister    Hypertension  Sister    Hyperlipidemia Sister    Hypertension Brother    Stroke Brother    Hypertension Brother    Stroke Brother    Hypertension Brother    Hypertension Daughter    Diabetes Daughter    Obesity Daughter        gastric sleeve surgery 03/05/2014   Drug abuse Son        cocaine   Esophageal cancer Neg Hx    Rectal cancer Neg Hx    Stomach cancer Neg Hx    Crohn's disease Neg Hx    Ulcerative colitis Neg Hx     SOCIAL HISTORY: Social History   Tobacco Use   Smoking status: Never    Passive exposure: Current (husband smoke)   Smokeless tobacco: Never  Vaping Use   Vaping status: Never Used  Substance Use Topics   Alcohol use: Yes    Comment: rarely   Drug use: No   Social History   Social History Narrative   Lives with her husband.  Her adult children live locally.      College education, works as a Customer service manager.      Right-handed   Caffeine: 1-2 cups coffee and one soda per day      Objective:  Vital Signs:  There were no vitals taken for this visit.  ***  Labs and Imaging review: New results: 11/25/23: IFE: no M protein B6 wnl B1 wnl  HbA1c (external - 03/27/24): 6.8  Previously reviewed results: External labs: 10/04/23: HbA1c: 6.4 CMP significant for glucose 164, alk phos 124 Lipid panel: tChol 179, LDL 107, TG 147 B12: 807   Imaging/Procedures: Lumbar spine xray (01/18/2018): AP lateral and lateral lumbar flexion-extension x-rays are obtained and  reviewed.  Patient has grade 1 anterolisthesis at L4-5.  Anterior endplate  spurring with mild narrowing L1-2,L2-3.  Calcification of the abdominal  aorta.   Impression: Minimal anterolisthesis L4-5 unchanged from previous  radiographs 2015 and MRI 2018.    MRI lumbar spine wo contrast (01/26/2018): FINDINGS: Segmentation:  Standard.   Alignment:  Unchanged trace anterolisthesis of L4 on L5.   Vertebrae: Increased marrow edema extending along the L3 superior endplate to the right of  midline which appears to be associated with a very small Schmorl's node. No fracture or suspicious osseous lesion. Mild degenerative endplate changes in the upper lumbar and lower thoracic spine.   Conus medullaris and cauda equina: Conus extends to the L1-2 level. Conus and cauda equina appear normal.   Paraspinal and other soft tissues: Unremarkable.   Disc levels:   Disc desiccation throughout the lumbar and lower thoracic spine with exception of L5-S1. Mild disc space narrowing at L1-2 and in the lower thoracic spine, unchanged.   T10-11: Only imaged sagittally. Moderate facet arthrosis resulting in at most mild bilateral neural foraminal stenosis, similar to prior. No spinal stenosis.   T11-12: Only imaged sagittally. Mild facet arthrosis without stenosis, similar to prior.   T12-L1: Minimal disc bulging without stenosis, unchanged.   L1-2: Minimal disc bulging and mild facet hypertrophy without stenosis, unchanged.   L2-3: Circumferential disc bulging, a broad left foraminal disc protrusion, and mild facet hypertrophy result in mild right and mild-to-moderate left lateral recess stenosis and mild spinal stenosis, progressed from prior. No significant neural foraminal stenosis.   L3-4: Mild facet and ligamentum flavum hypertrophy without significant stenosis, unchanged. Unchanged 4 mm cyst within the right ligamentum flavum.   L4-5: Anterolisthesis with slight disc uncovering, ligamentum flavum thickening, and severe facet arthrosis result in moderate spinal stenosis and moderate right greater than left lateral recess stenosis, unchanged. No neural foraminal stenosis.   L5-S1: Mild facet hypertrophy without disc herniation or stenosis.   IMPRESSION: 1. Mildly progressive disc degeneration at L2-3 resulting in mild spinal stenosis and left greater than right lateral recess stenosis. 2. Unchanged moderate multifactorial spinal stenosis at L4-5.   Cervical spine  xray (05/01/2006): CERVICAL SPINE - 5 VIEWS:  Five views of the cervical spine were obtained. The cervical vertebrae are in normal alignment. There is degenerative disc disease at C5-6 and to a lesser degree C6-7 levels. On oblique view only mild foraminal narrowing is noted at C5-6 bilaterally. The odontoid process is intact.   IMPRESSION:     Degenerative disc disease at C5-6 with mild foraminal narrowing. Normal alignment.    Thoracic spine xray (05/01/2006): THORACIC SPINE - 2 VIEWS:  Two views of the thoracic spine show normal alignment with only mild degenerative change. No acute bony abnormality is seen.  IMPRESSION:     No acute abnormality. Mild degenerative change.    EMG (03/30/2017 at [facility] by Dr. Jenel): NERVE CONDUCTION STUDIES:   Nerve conduction studies were performed on the left upper extremity. The distal motor latencies and motor amplitudes for the median and ulnar nerves were within normal limits. The nerve conduction velocities for these nerves were also normal. The sensory latencies for the median and ulnar nerves were normal. The F wave latency for the left ulnar nerve was normal.   Nerve conduction studies were performed on both lower extremities. The distal motor latencies for the peroneal and posterior tibial nerves were normal bilaterally with low motor amplitudes for the right peroneal nerve and for the posterior tibial nerves bilaterally. The nerve conduction velocities for the peroneal and posterior tibial nerves were normal bilaterally. The sensory latencies for the sural nerves were normal bilaterally but unobtainable for the peroneal nerves bilaterally. The posterior tibial F wave latencies were within normal limits bilaterally.   EMG STUDIES:   EMG study was performed on the  left lower extremity:   The tibialis anterior muscle reveals 2 to 4K motor units with full recruitment. No fibrillations or positive waves were seen. The peroneus tertius muscle reveals 2 to  4K motor units with full recruitment. No fibrillations or positive waves were seen. The medial gastrocnemius muscle reveals 1 to 3K motor units with full recruitment. No fibrillations or positive waves were seen. The vastus lateralis muscle reveals 2 to 4K motor units with full recruitment. No fibrillations or positive waves were seen. The iliopsoas muscle reveals 2 to 4K motor units with full recruitment. No fibrillations or positive waves were seen. The biceps femoris muscle (long head) reveals 2 to 4K motor units with full recruitment. No fibrillations or positive waves were seen. The lumbosacral paraspinal muscles were tested at 3 levels, and revealed no abnormalities of insertional activity at all 3 levels tested. There was good relaxation.     IMPRESSION:   Nerve conduction studies done on the left upper extremity and both lower extremities shows findings that are consistent with a mild primarily axonal peripheral neuropathy. EMG evaluation of the left lower extremity was unremarkable, without evidence of an overlying lumbosacral radiculopathy.  Assessment/Plan:  This is Karina Davis, a 78 y.o. female with: ***   Plan: ***  Return to clinic in ***  Total time spent reviewing records, interview, history/exam, documentation, and coordination of care on day of encounter:  *** min  Venetia Potters, MD

## 2024-05-26 ENCOUNTER — Ambulatory Visit: Payer: PPO | Admitting: Neurology

## 2024-06-04 ENCOUNTER — Other Ambulatory Visit: Payer: Self-pay

## 2024-06-04 ENCOUNTER — Emergency Department (HOSPITAL_BASED_OUTPATIENT_CLINIC_OR_DEPARTMENT_OTHER)
Admission: EM | Admit: 2024-06-04 | Discharge: 2024-06-05 | Disposition: A | Discharge status: Home / Self Care | Attending: Emergency Medicine | Admitting: Emergency Medicine

## 2024-06-04 ENCOUNTER — Encounter (HOSPITAL_BASED_OUTPATIENT_CLINIC_OR_DEPARTMENT_OTHER): Payer: Self-pay

## 2024-06-04 ENCOUNTER — Emergency Department (HOSPITAL_BASED_OUTPATIENT_CLINIC_OR_DEPARTMENT_OTHER)

## 2024-06-04 DIAGNOSIS — Z7982 Long term (current) use of aspirin: Secondary | ICD-10-CM | POA: Diagnosis not present

## 2024-06-04 DIAGNOSIS — S0990XA Unspecified injury of head, initial encounter: Secondary | ICD-10-CM | POA: Insufficient documentation

## 2024-06-04 DIAGNOSIS — M25552 Pain in left hip: Secondary | ICD-10-CM | POA: Diagnosis not present

## 2024-06-04 DIAGNOSIS — Z7984 Long term (current) use of oral hypoglycemic drugs: Secondary | ICD-10-CM | POA: Insufficient documentation

## 2024-06-04 DIAGNOSIS — S161XXA Strain of muscle, fascia and tendon at neck level, initial encounter: Secondary | ICD-10-CM | POA: Insufficient documentation

## 2024-06-04 DIAGNOSIS — Z79899 Other long term (current) drug therapy: Secondary | ICD-10-CM | POA: Diagnosis not present

## 2024-06-04 DIAGNOSIS — E1140 Type 2 diabetes mellitus with diabetic neuropathy, unspecified: Secondary | ICD-10-CM | POA: Insufficient documentation

## 2024-06-04 DIAGNOSIS — M25512 Pain in left shoulder: Secondary | ICD-10-CM | POA: Insufficient documentation

## 2024-06-04 DIAGNOSIS — I10 Essential (primary) hypertension: Secondary | ICD-10-CM | POA: Insufficient documentation

## 2024-06-04 DIAGNOSIS — Y92014 Private driveway to single-family (private) house as the place of occurrence of the external cause: Secondary | ICD-10-CM | POA: Insufficient documentation

## 2024-06-04 NOTE — Discharge Instructions (Signed)
 Take your meloxicam  for the stiffness and soreness.  Make an appointment to follow-up with your doctor.  Would expect you to be sore and stiff for the next several days.  Hopefully everything will resolve.  Head CT and CT neck without any acute findings.
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
## 2024-06-04 NOTE — ED Notes (Signed)
 Patient transported to X-ray

## 2024-06-04 NOTE — ED Notes (Signed)
 Rechecked pt's BP. MD made aware
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
